# Patient Record
Sex: Female | Born: 1935 | Race: Asian | Hispanic: No | Marital: Married | Smoking: Never smoker
Health system: Southern US, Community
[De-identification: ages and names within clinical notes are randomized; demographics above are authoritative.]

## PROBLEM LIST (undated history)

## (undated) DIAGNOSIS — I6381 Other cerebral infarction due to occlusion or stenosis of small artery: Secondary | ICD-10-CM

## (undated) DIAGNOSIS — I639 Cerebral infarction, unspecified: Secondary | ICD-10-CM

## (undated) DIAGNOSIS — E119 Type 2 diabetes mellitus without complications: Secondary | ICD-10-CM

## (undated) DIAGNOSIS — H9190 Unspecified hearing loss, unspecified ear: Secondary | ICD-10-CM

## (undated) HISTORY — DX: Unspecified hearing loss, unspecified ear: H91.90

## (undated) HISTORY — DX: Cerebral infarction, unspecified: I63.9

## (undated) HISTORY — PX: OTHER SURGICAL HISTORY: SHX169

## (undated) HISTORY — DX: Other cerebral infarction due to occlusion or stenosis of small artery: I63.81

## (undated) HISTORY — DX: Type 2 diabetes mellitus without complications: E11.9

---

## 2004-11-13 ENCOUNTER — Other Ambulatory Visit: Admission: RE | Admit: 2004-11-13 | Discharge: 2004-11-13 | Payer: Self-pay | Admitting: Obstetrics and Gynecology

## 2004-11-21 ENCOUNTER — Encounter: Admission: RE | Admit: 2004-11-21 | Discharge: 2004-11-21 | Payer: Self-pay | Admitting: Gastroenterology

## 2006-02-18 ENCOUNTER — Observation Stay (HOSPITAL_COMMUNITY): Admission: AD | Admit: 2006-02-18 | Discharge: 2006-02-19 | Payer: Self-pay | Admitting: Cardiology

## 2006-07-03 ENCOUNTER — Encounter: Payer: Self-pay | Admitting: Internal Medicine

## 2007-05-18 ENCOUNTER — Observation Stay (HOSPITAL_COMMUNITY): Admission: EM | Admit: 2007-05-18 | Discharge: 2007-05-24 | Payer: Self-pay | Admitting: Emergency Medicine

## 2007-11-14 IMAGING — CR DG CHEST 1V PORT
1 series · 1 of 1 positions shown · non-contrast
Comparison: 11/21/2004

CLINICAL DATA: Unstable angina

PORTABLE CHEST - 1 VIEW:

[view not recorded]
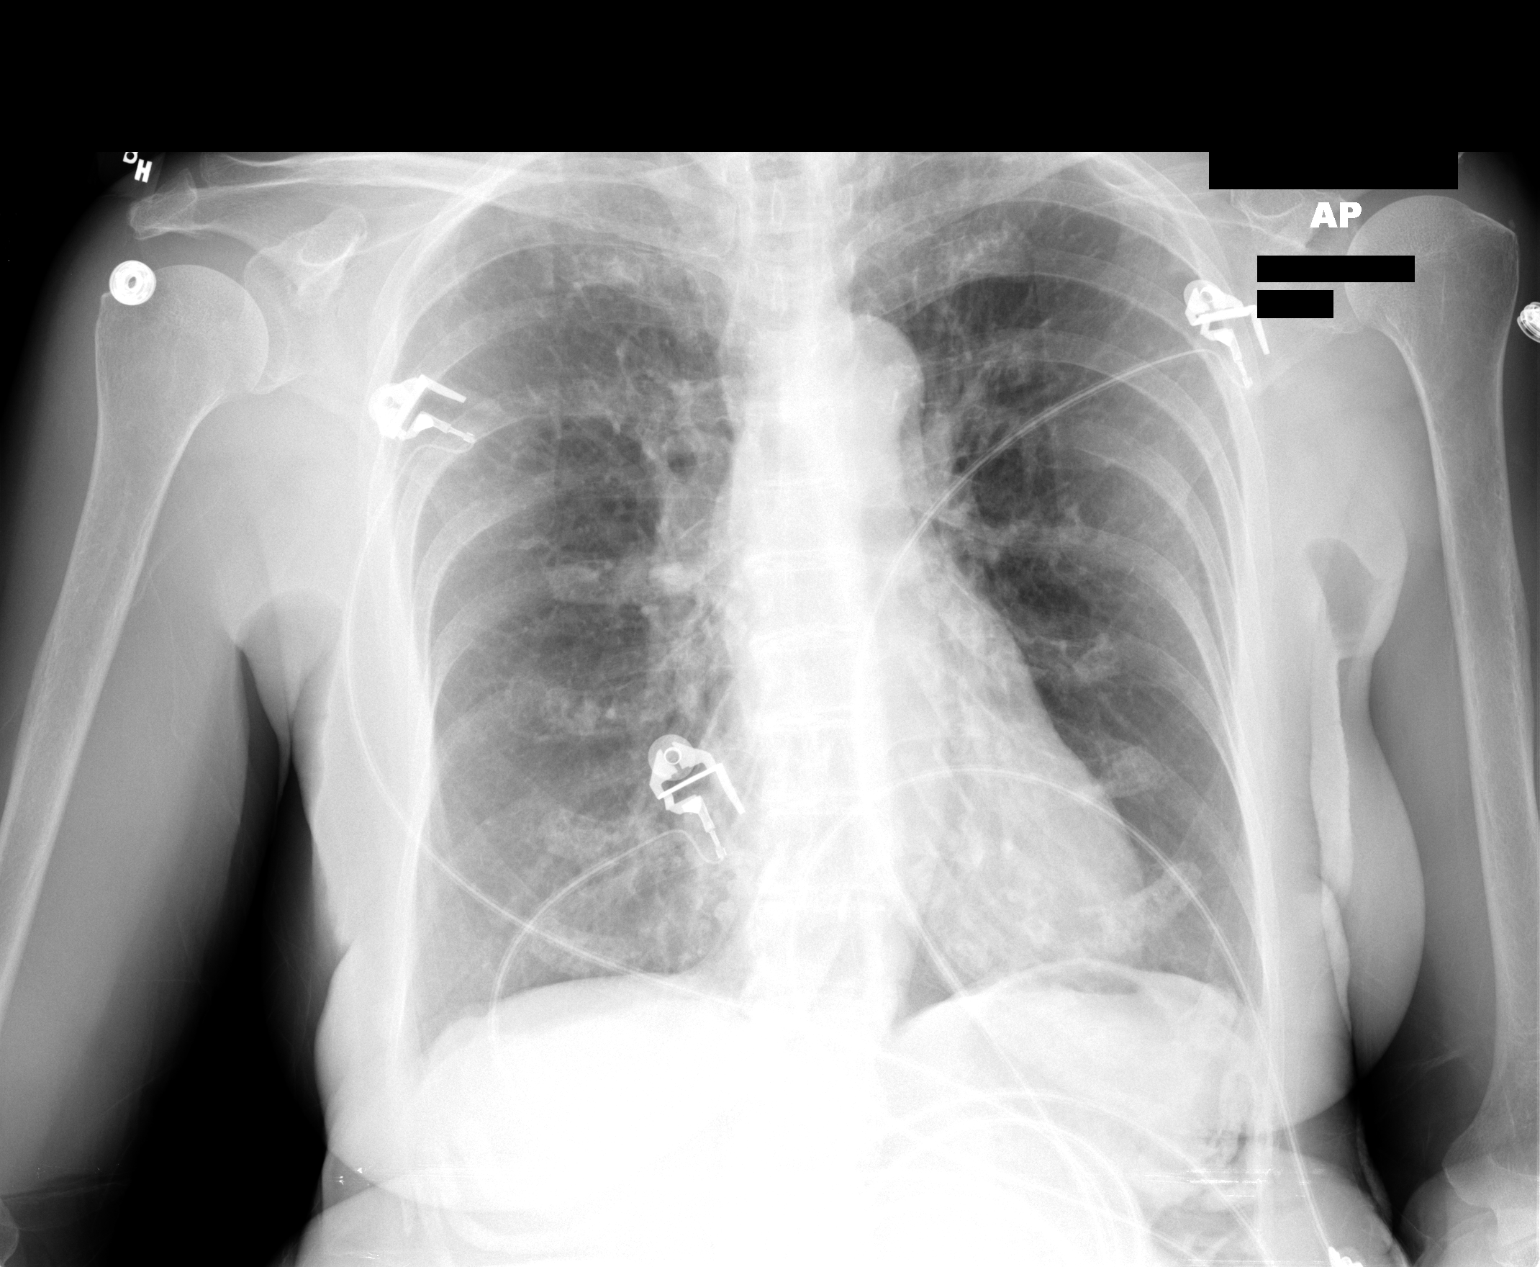

[1 of 1 positions shown; findings below may reference images not displayed]

FINDINGS: Mild COPD. Heart is borderline size. No focal airspace opacities or
effusions.
IMPRESSION: Mild COPD. No acute findings.

## 2007-12-15 ENCOUNTER — Encounter: Admission: RE | Admit: 2007-12-15 | Discharge: 2007-12-15 | Payer: Self-pay | Admitting: Neurology

## 2009-03-19 ENCOUNTER — Ambulatory Visit (HOSPITAL_COMMUNITY): Admission: RE | Admit: 2009-03-19 | Discharge: 2009-03-19 | Payer: Self-pay | Admitting: Internal Medicine

## 2009-10-24 DIAGNOSIS — E785 Hyperlipidemia, unspecified: Secondary | ICD-10-CM | POA: Insufficient documentation

## 2009-10-24 DIAGNOSIS — I2510 Atherosclerotic heart disease of native coronary artery without angina pectoris: Secondary | ICD-10-CM | POA: Insufficient documentation

## 2009-10-24 DIAGNOSIS — M949 Disorder of cartilage, unspecified: Secondary | ICD-10-CM

## 2009-10-24 DIAGNOSIS — I10 Essential (primary) hypertension: Secondary | ICD-10-CM | POA: Insufficient documentation

## 2009-10-24 DIAGNOSIS — J45909 Unspecified asthma, uncomplicated: Secondary | ICD-10-CM | POA: Insufficient documentation

## 2009-10-24 DIAGNOSIS — F329 Major depressive disorder, single episode, unspecified: Secondary | ICD-10-CM | POA: Insufficient documentation

## 2009-10-24 DIAGNOSIS — M899 Disorder of bone, unspecified: Secondary | ICD-10-CM | POA: Insufficient documentation

## 2009-10-24 DIAGNOSIS — J301 Allergic rhinitis due to pollen: Secondary | ICD-10-CM | POA: Insufficient documentation

## 2009-10-25 ENCOUNTER — Ambulatory Visit: Payer: Self-pay | Admitting: Internal Medicine

## 2009-10-25 DIAGNOSIS — R0600 Dyspnea, unspecified: Secondary | ICD-10-CM | POA: Insufficient documentation

## 2009-10-25 DIAGNOSIS — R059 Cough, unspecified: Secondary | ICD-10-CM | POA: Insufficient documentation

## 2009-10-25 DIAGNOSIS — R05 Cough: Secondary | ICD-10-CM

## 2009-11-14 ENCOUNTER — Telehealth: Payer: Self-pay | Admitting: Internal Medicine

## 2009-12-05 ENCOUNTER — Ambulatory Visit: Payer: Self-pay | Admitting: Internal Medicine

## 2009-12-07 ENCOUNTER — Ambulatory Visit: Payer: Self-pay | Admitting: Internal Medicine

## 2010-02-13 ENCOUNTER — Encounter: Payer: Self-pay | Admitting: Internal Medicine

## 2010-03-06 ENCOUNTER — Ambulatory Visit: Payer: Self-pay | Admitting: Internal Medicine

## 2010-04-10 ENCOUNTER — Ambulatory Visit: Payer: Self-pay | Admitting: Internal Medicine

## 2010-04-10 DIAGNOSIS — J029 Acute pharyngitis, unspecified: Secondary | ICD-10-CM | POA: Insufficient documentation

## 2010-04-11 ENCOUNTER — Encounter: Payer: Self-pay | Admitting: Internal Medicine

## 2010-05-08 ENCOUNTER — Telehealth: Payer: Self-pay | Admitting: Internal Medicine

## 2010-05-17 ENCOUNTER — Telehealth (INDEPENDENT_AMBULATORY_CARE_PROVIDER_SITE_OTHER): Payer: Self-pay | Admitting: *Deleted

## 2010-05-21 ENCOUNTER — Telehealth: Payer: Self-pay | Admitting: Internal Medicine

## 2010-06-19 ENCOUNTER — Encounter: Payer: Self-pay | Admitting: Internal Medicine

## 2010-09-17 ENCOUNTER — Telehealth: Payer: Self-pay | Admitting: Internal Medicine

## 2010-09-24 NOTE — Progress Notes (Signed)
Summary: check on pt sore throat-  Phone Note Outgoing Call   Call placed by: Carron Curie CMA,  May 08, 2010 4:42 PM Call placed to: Patient Summary of Call: MR requested that I call the patient and check to see if her sore throat has improved. I LMTCBx1 Initial call taken by: Carron Curie CMA,  May 08, 2010 4:43 PM  Follow-up for Phone Call        LMTCBx2. Carron Curie CMA  May 14, 2010 10:01 AM  pt states she still has a sore throat and hoarseness. She has stopped using symbicort, but this has not helped her throat. Please advise. Carron Curie CMA  May 15, 2010 4:15 PM   Additional Follow-up for Phone Call Additional follow up Details #1::        refer to ENT if still persists. [facility]. Order done Additional Follow-up by: Kalman Shan MD,  June 05, 2010 2:35 PM    Additional Follow-up for Phone Call Additional follow up Details #2::    Appt scheduled with Dr. Annalee Genta for Wed. 06/09/10 at 1:30. Pt to arrive at 1:15. Called and spoke with pt and she is aware of appt date, time and location. Appt card mailed to pt. Rhonda Cobb  June 05, 2010 3:11 PM

## 2010-09-24 NOTE — Miscellaneous (Signed)
Summary: reminder to check on patient and echo request  Clinical Lists Changes pls set reminder in 1 week to call her and find about sore throat. also, pls get her echo report from Dr. Sharyn Lull per MR.   I placed a reminder to myself to check on pt in 1 week and also called Dr. Annitta Jersey office and requested pt last echo.  Carron Curie CMA  April 11, 2010 3:31 PM   <no value>

## 2010-09-24 NOTE — Letter (Signed)
Summary: [facility]  [facility]   Imported By: Sherian Rein 02/28/2010 14:58:46  _____________________________________________________________________  External Attachment:    Type:   Image     Comment:   External Document

## 2010-09-24 NOTE — Progress Notes (Signed)
Summary: speak to nurse about rehab  Phone Note Call from Patient Call back at Home Phone [phone]   Caller: Patient Call For: Fronia Depass Summary of Call: pt has questions re: diagnostic procedure or therapy. requests to speak to jennifer. wouldn't elaborate but said jennifer will know what this is regarding.  Initial call taken by: Tivis Ringer, CNA,  May 21, 2010 11:46 AM  Follow-up for Phone Call        LMTCBx1. Carron Curie CMA  May 23, 2010 10:04 AM  LMTCBx2. Carron Curie CMA  May 24, 2010 12:09 PM  Pt husband wanted to know if the pulmonary rehab was a diagnostic procedure or therapy. I advised it is not diagnostic, but he would have to discuss with his insurance on how they label it. pt husband stated understanding. Carron Curie CMA  May 24, 2010 3:38 PM

## 2010-09-24 NOTE — Assessment & Plan Note (Signed)
Summary: 1 month/apc   Visit Type:  Follow-up Copy to:  Dr. Felipa Eth Primary Provider/Referring Provider:  Dr. Felipa Eth - PMD, Dr. Sharyn Lull - Cards  CC:  pt here for follow-up.  Pt feels like she has something stuck in her throat and and hoarseness. Marland Kitchen  History of Present Illness: OV 03/06/2010: Followup dyspnea. Last visit 10/25/2009 and 12/07/2009. Features of fixed dyspnea with hyperinflated cxr.  PFTs 12/05/2009  show hyperinflation and air trapping but otherwise normal.  I had started symbicort. However, she did not take it because of a death of a friend. She saw PMD Dr. Felipa Eth on6/22/2011 and he noticed that dyspnea still persisted. He also elicited hx that dyspnea worse at home rather than at exercise club. So, I reviewed history again. She tells me now that she did have ashma as an adult; details not known. She still is very dyspneic with exertion. Dysnea is worse at home and better at exercise club. She insists she has been compliant with symbicort for several weeks now but no change in dyspnea. She is also trying OTC antihistamine but this has not helped. Shehas ASTELIN rescue with her and she is using it a lot as needded. No other complaints. Of note, she admits to mold at home which is 75 years old. Also, we walked her 155 feet x 3 laps today and she did not desaturate.   REC: ADD SINGULAIR to SYMBICORt. DEFER HP PANEL and CT CHEST   OV April 10, 2010. Followup dyspnea. She maintains now that she is compliant with symbicort 2 puff two times a day and singulair daily. She has not cleaned out the mold at home. I am unclear if she is subjectively better or not; story changes constantly. But again it seems that she is less dyspneic outside the house than inside. The main issue bothering her this visit is One week of hoarseness of voice and sore throat of moderate intensity but without fever, cold, wheeze, worsening dyspnea or sick contacts.    Preventive Screening-Counseling &  Management  Alcohol-Tobacco     Smoking Status: never  Current Medications (verified): 1)  Azor 5-20 Mg Tabs (Amlodipine-Olmesartan) .... Take 1 Tablet By Mouthonce A Day 2)  Toprol Xl 25 Mg Xr24h-Tab (Metoprolol Succinate) .... 1/2  Once Daily 3)  Amaryl 1 Mg Tabs (Glimepiride) .... 1/2 Once Daily 4)  Crestor 5 Mg Tabs (Rosuvastatin Calcium) .Marland Kitchen.. 1 Once Daily 5)  Hydrocodone-Acetaminophen 5-500 Mg Tabs (Hydrocodone-Acetaminophen) .Marland Kitchen.. 1 Every 4-6 Hours As Needed 6)  Plavix 75 Mg Tabs (Clopidogrel Bisulfate) .Marland Kitchen.. 1 Once Daily 7)  Aspirin 81 Mg Tbec (Aspirin) .Marland Kitchen.. 1 Once Daily 8)  B-100 Complex  Tabs (Vitamins-Lipotropics) .Marland Kitchen.. 1 Once Daily 9)  Calcium Plus Vitamin D 600-100 Mg-Unit Caps (Calcium Carbonate-Vitamin D) .Marland Kitchen.. 1 Two Times A Day 10)  Fish Oil 1000 Mg Caps (Omega-3 Fatty Acids) .Marland Kitchen.. 1 Once Daily 11)  Multivitamins  Tabs (Multiple Vitamin) .Marland Kitchen.. 1 Once Daily 12)  Nu-Iron 150 Mg Caps (Polysaccharide Iron Complex) .Marland Kitchen.. 1 Once Daily 13)  Vitamin D 400 Unit Caps (Cholecalciferol) .Marland Kitchen.. 1 Once Daily 14)  Symbicort 80-4.5 Mcg/act Aero (Budesonide-Formoterol Fumarate) .... 2 Puffs Twice Daily 15)  Sm Nasal Spray Saline 0.65 % Soln (Saline) .... 2 Sprays Twice Daily 16)  Astelin 137 Mcg/spray Soln (Azelastine Hcl) .... One Spray Each Nostril Once Daily 17)  Singulair 10 Mg Tabs (Montelukast Sodium) .... At Bedtime  Allergies (verified): 1)  ! Sulfa  Past History:  Past medical, surgical, family and  social histories (including risk factors) reviewed, and no changes noted (except as noted below).  Past Medical History: Current Problems:  OSTEOPENIA (ICD-733.90) #Chronic Dyspnea/Likely asthma....Marland KitchenMarland KitchenDr Marchelle Gearing > Dxed on PFts 12/05/2009  Fev1 1.1L/84% with 12% BD response, hyperinflated cxr and lung volumes > spiro 7/132/2011 Fev1 0.76L/58%....add singulair to symbicort > Cleda Daub 04/10/2010 Fev1 1.17L/90%..continue snigulair/symbicort #ALLERGIC RHINITIS, SEASONAL (ICD-477.0) DEPRESSION  (ICD-311) HYPERTENSION (ICD-401.9) HYPERLIPIDEMIA (ICD-272.4) CAD (ICD-414.00) Positional Vertigo Constipation Depression Denies TB, asthma,  Tiny Left apical ptx - 9/23/20008 - spont resolved. ? due to fall   Family History: Reviewed history from 10/25/2009 and no changes required. Father-died at 16, bladder cancer Mother-breast cancer, died age 24 has 6 sisters, 5 brothers 1-Brother-diabetes, asthma 1-Brother-diabetes 1-Sister-diabetes, MI 1-Brother-died at 22 MI  Social History: Reviewed history from 10/25/2009 and no changes required. Married. 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 Son is an Advertising account planner in [place] and Insurance underwriter in [place].  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  Immigrant from [place]. Early [place] immigrant to [place] Husband aged 62 in year 2011.  Alive. 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 independently living at home  Review of Systems       The patient complains of productive cough and sore throat.  The patient denies shortness of breath at rest, non-productive cough, coughing up blood, chest pain, irregular heartbeats, acid heartburn, indigestion, loss of appetite, weight change, abdominal pain, difficulty swallowing, tooth/dental problems, headaches, nasal congestion/difficulty breathing through nose, sneezing, itching, ear ache, anxiety, depression, hand/feet swelling, joint stiffness or pain, rash, change in color of mucus, and fever.    Vital Signs:  Patient profile:   77 year old female Height:      60 inches Weight:      112 pounds BMI:     21.95 O2 Sat:      98 % on Room air Temp:     97.7 degrees F oral Pulse rate:   67 / minute BP sitting:   108 / 70  (right arm) Cuff size:   regular  Vitals Entered By: Carron Curie CMA (April 10, 2010 12:18 PM)  O2 Flow:  Room air CC: pt here for follow-up.  Pt feels like she has something stuck in her throat, and hoarseness.  Comments Medications reviewed with patient Carron Curie CMA  April 10, 2010 12:22 PM Daytime phone number verified with patient.    Physical Exam  General:  thin.  cheerful. looks 10-20 years younger than stated age Head:  normocephalic and atraumatic Eyes:  PERRLA/EOM intact; conjunctiva and sclera clear Ears:  TMs intact and clear with normal canals Nose:  no deformity, discharge, inflammation, or lesions Mouth:  no deformity or lesions Neck:  no masses, thyromegaly, or abnormal cervical nodes Chest Wall:  no deformities noted Lungs:  clear bilaterally to auscultation and percussion Heart:  regular rhythm, normal rate, no rubs, no gallops, and Grade  3/6 SEM loudest primary aortic area  Abdomen:  bowel sounds positive; abdomen soft and non-tender without masses, or organomegaly Msk:  no deformity or scoliosis noted with normal posture Pulses:  pulses normal Extremities:  no clubbing, cyanosis, edema, or deformity noted Neurologic:  CN II-XII grossly intact with normal reflexes, coordination, muscle strength and tone Skin:  intact without lesions or rashes Cervical Nodes:  no significant adenopathy Axillary Nodes:  no significant adenopathy Psych:  alert and cooperative; normal mood and affect; normal attention span and concentration   Pulmonary Function Test Date: 04/10/2010 12:46 AM Gender: Female  Pre-Spirometry  FVC    Value: 1.65 L/min   % Pred: 92.60 % FEV1    Value: 1.17 L     Pred: 1.30 L     % Pred: 90.30 % FEV1/FVC  Value: 71.01 %     % Pred: 97.60 %  Evaluation: normal - improved  Impression & Recommendations:  Problem # 1:  DYSPNEA (ICD-786.05) Assessment Unchanged  Spriometry has normalized after adding singulair n July 2011 to her pre-existing regimen of symbicort. I am empirically treating her as though she has adult onset asthma. STill I am unsure if she is subjectively better or not; story fluctuates and if there is an alternate diagnosis. IT does appear though she has some amount of dyspnea esp when  she is at home. I have asked her agaiin to clean out the mold in the house. I have also referred her to pulmonary rehab to help relieve dyspnea.   I also paged Dr Sharyn Lull to find out severit of aortic stenosis but did not hear from him. So, I will try to ger her ECHO report  IF these measures do not help, we might have to get CT and HP panel; she was reluctant to have these done in July 2011 visit.   Orders: Est. Patient Level II (13086)  Problem # 2:  SORE THROAT (ICD-462) Assessment: New No evidence of thrush. Unclear if related to symbicort. Will give her emopiric nystatin. IF persists, will refer her to EENT. Will call her in 1 week and check Her updated medication list for this problem includes:    Aspirin 81 Mg Tbec (Aspirin) .Marland Kitchen... 1 once daily    Nystatin 100000 Unit/ml Susp (Nystatin) .Marland Kitchen... Take one teaspoonful by mouth swish & swallow four times a day x 5 days  Orders: Prescription Created Electronically [phone]) Est. Patient Level II (96295)  Medications Added to Medication List This Visit: 1)  Singulair 10 Mg Tabs (Montelukast sodium) .... At bedtime 2)  Nystatin 100000 Unit/ml Susp (Nystatin) .... Take one teaspoonful by mouth swish & swallow four times a day x 5 days  Other Orders: Rehabilitation Referral (Rehab)  Patient Instructions: 1)  continue symbicort 2 puff two times a day 2)  cntinue singulair 10mg  by mouth at bedtime 3)  take nystatin for 5 days as directed 4)  call us in one week to report progress 5)  if sore throat still present after 1-2 weeks we might have change your inhalers around 6)  i will speak to Dr. Sharyn Lull about your heart murmur 7)  try to clear the mold out of your house 8)  i will refer you to pulmonary rehab 9)  return in 3 months Prescriptions: SINGULAIR 10 MG TABS (MONTELUKAST SODIUM) at bedtime  #30 x 3   Entered and Authorized by:   Kalman Shan MD   Signed by:   Kalman Shan MD on 04/10/2010   Method used:   Electronically  to        [facility] (retail)       [address]       [place], [place]  [address]       Ph: [phone]       Fax: [phone]   RxID:   [id number] NYSTATIN 100000 UNIT/ML  SUSP (NYSTATIN) Take one teaspoonful by mouth swish & swallow four times a day x 5 days  #1 x 0   Entered and Authorized by:   Kalman Shan MD  Signed by:   Kalman Shan MD on 04/10/2010   Method used:   [facility]ronically to        [facility] ([facility]       [address]       [place], [place]  [address]       Ph: [phone]       Fax: [phone]   RxID:   [id number]    [employer] Spirometry  ID: [id number] Patient: Ma Rings D DOB: Sep 07, 1922 Age: 77 Years Old Sex: Female Race: Other Physician: Ramaswamy Height: 60 Weight: 112 Status: Unconfirmed Past Medical History:  Current Problems:  OSTEOPENIA (ICD-733.90) ASTHMA (ICD-493.90) ALLERGIC RHINITIS, SEASONAL (ICD-477.0) DEPRESSION (ICD-311) HYPERTENSION (ICD-401.9) HYPERLIPIDEMIA (ICD-272.4) CAD (ICD-414.00) Positional Vertigo Constipation Depression Denies TB, asthma,  Tiny Left apical ptx - 9/23/20008 - spont resolved. ? due to fall   Recorded: 04/10/2010 12:46 AM  Parameter  Measured Predicted %Predicted FVC     1.65        1.78        92.60 FEV1     1.17        1.30        90.30 FEV1%   71.01        72.75        97.60 PEF    3.89        3.67        106   Interpretation:

## 2010-09-24 NOTE — Consult Note (Signed)
Summary: [facility]  [facility]   Imported By: Sherian Rein 07/10/2010 14:21:25  _____________________________________________________________________  External Attachment:    Type:   Image     Comment:   External Document

## 2010-09-24 NOTE — Miscellaneous (Signed)
Summary: Orders Update pft charges  Clinical Lists Changes  Orders: Added new Service order of Carbon Monoxide diffusing w/capacity (94720) - Signed Added new Service order of Lung Volumes (94240) - Signed Added new Service order of Spirometry (Pre & Post) (94060) - Signed 

## 2010-09-24 NOTE — Assessment & Plan Note (Signed)
Summary: rov/apc   Visit Type:  Follow-up Copy to:  Dr. Felipa Eth Primary Provider/Referring Provider:  Dr. Felipa Eth - PMD, Dr. Sharyn Lull - Cards  CC:  Pt here for follow up. Pt states breathing is the same .  History of Present Illness: OV 12/07/2009: Followup dyspnea. Last visit 11/01/2009 and 12/07/2009. Features of fixed dyspnea with hyperinflated cxr.  PFTs 12/05/2009  show hyperinflation and air trapping but otherwise normal.  I had started symbicort. However, she did not take it because of a death of a friend. She saw PMD Dr. Felipa Eth on6/22/2011 and he noticed that dyspnea still persisted. He also elicited hx that dyspnea worse at home rather than at exercise club. So, I reviewed history again. She tells me now that she did have ashma as an adult; details not known. She still is very dyspneic with exertion. Dysnea is worse at home and better at exercise club. She insists she has been compliant with symbicort for several weeks now but no change in dyspnea. She is also trying OTC antihistamine but this has not helped. Shehas ASTELIN rescue with her and she is using it a lot as needded. No other complaints. Of note, she admits to mold at home which is 75 years old. Also, we walked her 155 feet x 3 laps today and she did not desaturate.   Preventive Screening-Counseling & Management  Alcohol-Tobacco     Smoking Status: never  Current Medications (verified): 1)  Azor 5-20 Mg Tabs (Amlodipine-Olmesartan) .... Take 1 Tablet By Mouthonce A Day 2)  Toprol Xl 25 Mg Xr24h-Tab (Metoprolol Succinate) .... 1/2  Once Daily 3)  Amaryl 1 Mg Tabs (Glimepiride) .... 1/2 Once Daily 4)  Crestor 5 Mg Tabs (Rosuvastatin Calcium) .Marland Kitchen.. 1 Once Daily 5)  Hydrocodone-Acetaminophen 5-500 Mg Tabs (Hydrocodone-Acetaminophen) .Marland Kitchen.. 1 Every 4-6 Hours As Needed 6)  Plavix 75 Mg Tabs (Clopidogrel Bisulfate) .Marland Kitchen.. 1 Once Daily 7)  Aspirin 81 Mg Tbec (Aspirin) .Marland Kitchen.. 1 Once Daily 8)  B-100 Complex  Tabs (Vitamins-Lipotropics) .Marland Kitchen.. 1 Once  Daily 9)  Calcium Plus Vitamin D 600-100 Mg-Unit Caps (Calcium Carbonate-Vitamin D) .Marland Kitchen.. 1 Two Times A Day 10)  Fish Oil 1000 Mg Caps (Omega-3 Fatty Acids) .Marland Kitchen.. 1 Once Daily 11)  Multivitamins  Tabs (Multiple Vitamin) .Marland Kitchen.. 1 Once Daily 12)  Nu-Iron 150 Mg Caps (Polysaccharide Iron Complex) .Marland Kitchen.. 1 Once Daily 13)  Vitamin D 400 Unit Caps (Cholecalciferol) .Marland Kitchen.. 1 Once Daily 14)  Symbicort 80-4.5 Mcg/act Aero (Budesonide-Formoterol Fumarate) .... 2 Puffs Twice Daily 15)  Sm Nasal Spray Saline 0.65 % Soln (Saline) .... 2 Sprays Twice Daily 16)  Astelin 137 Mcg/spray Soln (Azelastine Hcl) .... One Spray Each Nostril Once Daily 17)  Non-Drowsy Allergy Relief .... As Directed  Allergies (verified): 1)  ! Sulfa  Past History:  Family History: Last updated: 01-Nov-2009 Father-died at 79, bladder cancer Mother-breast cancer, died age 70 has 6 sisters, 5 brothers 1-Brother-diabetes, asthma 1-Brother-diabetes 1-Sister-diabetes, MI 1-Brother-died at 69 MI  Social History: Last updated: 11-01-2009 Married. 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 Son is an Advertising account planner in [place] and Insurance underwriter in [place], [place].  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  Immigrant from [place]. Early [place] to [place] Husband aged 63 in year 2011.  Alive. 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 Current Problems:  OSTEOPENIA (ICD-733.90) ASTHMA (ICD-493.90) ALLERGIC RHINITIS, SEASONAL (ICD-477.0) DEPRESSION (ICD-311) HYPERTENSION (  ICD-401.9) HYPERLIPIDEMIA (ICD-272.4) CAD (ICD-414.00) Positional Vertigo Constipation Depression Denies TB, asthma,  Tiny Left apical ptx - 9/23/20008 - spont resolved. ? due to fall   Family History: Reviewed history from 10/25/2009 and no changes required. Father-died at 37, bladder cancer Mother-breast  cancer, died age 16 has 6 sisters, 5 brothers 1-Brother-diabetes, asthma 1-Brother-diabetes 1-Sister-diabetes, MI 1-Brother-died at 48 MI  Social History: Reviewed history from 10/25/2009 and no changes required. Married. 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 Son is an Advertising account planner in [place] and Insurance underwriter in [place], [place].  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  Immigrant from [place]. Early [place] immigrant [place] Husband aged 75 in year 2011.  Alive. 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 independently living at homeSmoking Status:  never  Review of Systems       The patient complains of shortness of breath with activity and non-productive cough.  The patient denies shortness of breath at rest, productive cough, coughing up blood, chest pain, irregular heartbeats, acid heartburn, indigestion, loss of appetite, weight change, abdominal pain, difficulty swallowing, sore throat, tooth/dental problems, headaches, nasal congestion/difficulty breathing through nose, sneezing, itching, ear ache, anxiety, depression, hand/feet swelling, joint stiffness or pain, rash, change in color of mucus, and fever.    Vital Signs:  Patient profile:   77 year old female Height:      60 inches Weight:      11 pounds BMI:     2.16 O2 Sat:      99 % on Room air Temp:     97.4 degrees F oral Pulse rate:   71 / minute BP sitting:   124 / 64  (left arm) Cuff size:   regular  Vitals Entered By: Zackery Barefoot CMA (March 06, 2010 3:06 PM)  O2 Flow:  Room air CC: Pt here for follow up. Pt states breathing is the same  Comments Medications reviewed with patient Verified contact number and pharmacy with patient Zackery Barefoot CMA  March 06, 2010 3:06 PM    Physical Exam  General:  thin.  cheerful. looks 10-20 years younger than stated age Head:  normocephalic and atraumatic Eyes:  PERRLA/EOM intact; conjunctiva and sclera clear Ears:  TMs intact and clear with normal canals Nose:  no  deformity, discharge, inflammation, or lesions Mouth:  no deformity or lesions Neck:  no masses, thyromegaly, or abnormal cervical nodes Chest Wall:  no deformities noted Lungs:  clear bilaterally to auscultation and percussion Heart:  regular rate and rhythm, S1, S2 without murmurs, rubs, gallops, or clicks Abdomen:  bowel sounds positive; abdomen soft and non-tender without masses, or organomegaly Msk:  no deformity or scoliosis noted with normal posture Pulses:  pulses normal Extremities:  no clubbing, cyanosis, edema, or deformity noted Neurologic:  CN II-XII grossly intact with normal reflexes, coordination, muscle strength and tone Skin:  intact without lesions or rashes Cervical Nodes:  no significant adenopathy Axillary Nodes:  no significant adenopathy Psych:  alert and cooperative; normal mood and affect; normal attention span and concentration   Pulmonary Function Test Date: 03/06/2010 3:17 PM Gender: Female  Pre-Spirometry FVC    Value: 1.41 L/min   % Pred: 78.90 % FEV1    Value: 0.76 L     Pred: 1.30 L     % Pred: 58.80 % FEV1/FVC  Value: 54.21 %     % Pred: 74.50 %  Evaluation: moderate obstruction with NO significant bronchodilator response  Impression & Recommendations:  Problem # 1:  DYSPNEA (ICD-786.05)  Assessment Unchanged  Spriometry shows moderate obstruction. Exercise desaturation - she did not desaturate. She states sh e is complinat with symbciort and thisis not helping. I am wondering if she has fixed asthma or perhaps Hypersensitivity Pneumonitis due to mold exposure at home. She is reluctant to have serum testing for HP at this point. I will add singulair to regimen of symbicort. Will see her  in 1 month with spirometry. IF spiro unchanged, will get CT chest and HP panel . Meanwhile have asked her to get rid of mold at home  Orders: Prescription Created Electronically [phone]) Est. Patient Level II (63875) HFA Instruction [phone])  Medications Added  to Medication List This Visit: 1)  Astelin 137 Mcg/spray Soln (Azelastine hcl) .... One spray each nostril once daily 2)  Non-drowsy Allergy Relief  .... As directed 3)  Singulair 10 Mg Tabs (Montelukast sodium) .... One by mouth daily at bedtime  Patient Instructions: 1)  please continue symbicort 2 puff two times a day 2)  my nurse will reinforce technique 3)  start singulair 10mg  by mouth at bedtime 4)  returnin 1 month 5)  woill do spirometry at followup 6)  try to get rid of mold at home Prescriptions: SINGULAIR 10 MG  TABS (MONTELUKAST SODIUM) One by mouth daily at bedtime  #30 x 2   Entered and Authorized by:   Kalman Shan MD   Signed by:   Kalman Shan MD on 03/06/2010   Method used:   Electronically to        [facility]       [address]       [place], [place]  [address]       Ph: [phone]       Fax: [phone]   RxID:   [id number]     Immunization History:  Influenza Immunization History:    Influenza:  historical (05/28/2009)     CardioPerfect Spirometry  ID: [id number] Patient: [name], [name] DOB: 29-Apr-1923 Age: 77 Years Old Sex: Female Race: Other Physician: Elysa Womac Height: 60 Weight: 11 Status: Unconfirmed Past Medical History:  Current Problems:  OSTEOPENIA (ICD-733.90) ASTHMA (ICD-493.90) ALLERGIC RHINITIS, SEASONAL (ICD-477.0) DEPRESSION (ICD-311) HYPERTENSION (ICD-401.9) HYPERLIPIDEMIA (ICD-272.4) CAD (ICD-414.00) Positional Vertigo Constipation Depression Denies TB, asthma,  Tiny Left apical ptx - 9/23/20008 - spont resolved. ? due to fall   Recorded: 03/06/2010 3:17 PM  Parameter  Measured Predicted %Predicted FVC     1.41        1.78        78.90 FEV1     0.76        1.30        58.80 FEV1%   54.21        72.75        74.50 PEF    1.92        3.67        52.40   Interpretation:  Appended Document: rov/apc Ambulatory Pulse  Oximetry  Resting; HR___75__    02 Sat__99%ra___  Lap1 (185 feet)   HR__86___   02 Sat__98%ra___ Lap2 (185 feet)   HR___101__   02 Sat_98%ra___    Lap3 (185 feet)   HR_103____   02 Sat__96%ra___  _X__Test Completed without Difficulty ___Test Stopped due to:   Arman Filter LPN  March 06, 2010 4:17 PM   Clinical Lists Changes

## 2010-09-24 NOTE — Assessment & Plan Note (Signed)
Summary: asthma eval/assessment for riskof high altitude flying and va...   Visit Type:  Initial Consult Copy to:  Dr. Felipa Eth Primary Provider/Referring Provider:  Dr. Felipa Eth - PMD, Dr. Sharyn Lull - Cards  CC:  Pt c/o SOB with exertion and c/o feeling like something is in her throat. Marland Kitchen  History of Present Illness: IOV 10/24/2009: 76 year old [place] immigrant. c/o dyspnea. Progressive. None at rest. Worsened with exertion. Brought on by 'regular activiy" like household activities. Relieved by rest. STarted many years ago (unclear). Insidious onset. Initially noticed only with yard work.Cardiac etiology ruled out by Dr Sharyn Lull per  patient ("  you have young woman's heart"). Denies associated chest pain, orthopnea, paroxysmal nocturnal dyspnea, fever, chills, weight loss or wheeze. Denies having PFTs. Started symbicort 1 week ago empirically and now feeling better  But does have associated cough (laryngeal) cough for a chronic uncertain period of time but reckons it is in the order of weeks or months. She feels it is more a sensation of having to clear her throat. She does not feel this is  a major issue for her.   Of note: they are planning a trip to [place] 3/14/ through 3/22  Current Medications (verified): 1)  Azor 5-20 Mg Tabs (Amlodipine-Olmesartan) .... Take 1 Tablet By Mouth Two Times A Day 2)  Toprol Xl 25 Mg Xr24h-Tab (Metoprolol Succinate) .... 1/2  Once Daily 3)  Amaryl 1 Mg Tabs (Glimepiride) .Marland Kitchen.. 1 Once Daily 4)  Crestor 5 Mg Tabs (Rosuvastatin Calcium) .Marland Kitchen.. 1 Once Daily 5)  Hydrocodone-Acetaminophen 5-500 Mg Tabs (Hydrocodone-Acetaminophen) .Marland Kitchen.. 1 Every 4-6 Hours As Needed 6)  Plavix 75 Mg Tabs (Clopidogrel Bisulfate) .Marland Kitchen.. 1 Once Daily 7)  Aspirin 81 Mg Tbec (Aspirin) .Marland Kitchen.. 1 Once Daily 8)  B-100 Complex  Tabs (Vitamins-Lipotropics) .Marland Kitchen.. 1 Once Daily 9)  Calcium Plus Vitamin D 600-100 Mg-Unit Caps (Calcium Carbonate-Vitamin D) .Marland Kitchen.. 1 Two Times A Day 10)  Fish Oil 1000 Mg Caps (Omega-3  Fatty Acids) .Marland Kitchen.. 1 Once Daily 11)  Multivitamins  Tabs (Multiple Vitamin) .Marland Kitchen.. 1 Once Daily 12)  Nu-Iron 150 Mg Caps (Polysaccharide Iron Complex) .Marland Kitchen.. 1 Once Daily 13)  Vitamin D 400 Unit Caps (Cholecalciferol) .Marland Kitchen.. 1 Once Daily 14)  Symbicort 80-4.5 Mcg/act Aero (Budesonide-Formoterol Fumarate) .... 2 Puffs Twice Daily 15)  Sm Nasal Spray Saline 0.65 % Soln (Saline) .... 2 Sprays Twice Daily  Allergies (verified): 1)  ! Sulfa  Past History:  Family History: Last updated: Nov 05, 2009 Father-died at 49, bladder cancer Mother-breast cancer, died age 66 has 6 sisters, 5 brothers 1-Brother-diabetes, asthma 1-Brother-diabetes 1-Sister-diabetes, MI 1-Brother-died at 24 MI  Social History: Last updated: Nov 05, 2009 Married. 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 Son is an Advertising account planner in [place] and Insurance underwriter in [place], [place].  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  Immigrant from [place]. Early [place] immigrant [place] Husband aged 1 in year 2011.  Alive. 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 ? due to fall   Family History: Father-died at 19, bladder cancer Mother-breast cancer, died age 59 has 6 sisters, 5 brothers 1-Brother-diabetes, asthma 1-Brother-diabetes 1-Sister-diabetes, MI 1-Brother-died at 36 MI  Social History: Married. 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 Son is an opthalomologist in  [place] [employer] in [place], [place].  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  Immigrant from [place]. Early [place] immigrant [place] Husband aged 33 in year 2011.  Alive. 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  The patient denies shortness of breath at rest, productive cough, coughing up blood, chest pain, irregular heartbeats, acid heartburn, indigestion, loss of appetite, weight change, abdominal pain, difficulty swallowing, sore throat, tooth/dental problems, headaches, nasal congestion/difficulty breathing through nose, sneezing, itching, ear ache, anxiety, depression, hand/feet swelling, joint stiffness or pain, rash, change in color of mucus, and fever.    Vital Signs:  Patient profile:   77 year old female Height:      60 inches Weight:      112 pounds O2 Sat:      99 % on Room air Temp:     98.0 degrees F oral Pulse rate:   71 / minute BP sitting:   118 / 62  (right arm) Cuff size:   regular  Vitals Entered By: Carron Curie CMA (October 25, 2009 10:32 AM)  O2 Flow:  Room air  Serial Vital Signs/Assessments:  Comments: 11:41 AM Ambulatory Pulse Oximetry  Resting; HR__60___    02 Sat__100___  Lap1 (185 feet)   HR 91_____   02 Sat_99____ Lap2 (185 feet)   HR_94____   02 Sat_99____    Lap3 (185 feet)   HR_86____   02 Sat_99____  _x__Test Completed without Difficulty ___Test Stopped due to:   Elray Buba RN  October 25, 2009 11:42 AM  By: Elray Buba RN   CC: Pt c/o SOB with exertion and c/o feeling like something is in her throat.  Comments Medications reviewed with patient Carron Curie CMA  October 25, 2009 10:33 AM Daytime phone number verified with patient.     Physical Exam  General:  thin.  cheerful. looks 10-20 years younger than stated age Head:  normocephalic and atraumatic Eyes:  PERRLA/EOM intact; conjunctiva and sclera clear Ears:  TMs intact and clear with normal canals Nose:  no deformity, discharge, inflammation, or lesions Mouth:  no deformity or lesions Neck:  no masses, thyromegaly, or abnormal cervical nodes Chest Wall:  no  deformities noted Lungs:  clear bilaterally to auscultation and percussion Heart:  regular rate and rhythm, S1, S2 without murmurs, rubs, gallops, or clicks Abdomen:  bowel sounds positive; abdomen soft and non-tender without masses, or organomegaly Msk:  no deformity or scoliosis noted with normal posture Pulses:  pulses normal Extremities:  no clubbing, cyanosis, edema, or deformity noted Neurologic:  CN II-XII grossly intact with normal reflexes, coordination, muscle strength and tone Skin:  intact without lesions or rashes Cervical Nodes:  no significant adenopathy Axillary Nodes:  no significant adenopathy Psych:  alert and cooperative; normal mood and affect; normal attention span and concentration   CXR  Procedure date:  10/25/2009  Findings:      hyperinflated chest. no infiltrates  Comments:      independently reviewd  Impression & Recommendations:  Problem # 1:  DYSPNEA (ICD-786.05) Assessment New I am unclear why she is dyspneic. The fact she has hyperinflated chest on cxr and the fact she feels better after symbicort suggests she might have asthma. She does not desaturate with exertion suggesting there is no severe ILD or pulmonary hypertension or copd. I will get full PFTs and re-evaluate Orders: Pulmonary Referral (Pulmonary) T-2 View  CXR (71020TC) Consultation Level IV (16109)  Problem # 2:  COUGH (ICD-786.2) Assessment: New  She describes a clearing of throat. She is not bothered by it. But, I wonder if this is due to associated GERD. Fish oil could be contributing to this. I will adddress this at next visit  Orders: Consultation Level IV (60454)  Medications Added to Medication List This Visit: 1)  Azor 5-20 Mg Tabs (Amlodipine-olmesartan) .... Take 1 tablet by mouth two times a day 2)  Toprol Xl 25 Mg Xr24h-tab (Metoprolol succinate) .... 1/2  once daily 3)  Symbicort 80-4.5 Mcg/act Aero (Budesonide-formoterol fumarate) .... 2 puffs twice daily 4)  Sm  Nasal Spray Saline 0.65 % Soln (Saline) .... 2 sprays twice daily  Patient Instructions: 1)   you did not drop oxygen levels. Good news 2)  have full breathing test - to help [place] understand breathing issues 3)  as soon as test is done please have the tech call me to look at it  4)  Based on breathing test result, will advise  you of next step 5)  We need actual CXR hard film from June 2010 done at Dr Felipa Eth office - we can try to get it or you might have to go get it 6)  ................ 7)  UPDATE: Will get a fu appt visit for PFT review

## 2010-09-24 NOTE — Progress Notes (Signed)
Summary: clarification-LMTCBx1  Phone Note Call from Patient   Caller: Patient Call For: ramaswamy Summary of Call: need to no what kind of proceedure she is having . insurance is requesting this info..taken by Rickard Patience  Follow-up for Phone Call        [facility] to disucss with pt.Marland KitchenMarland KitchenI dont see that pt is schedueld for any procedure. Carron Curie CMA  May 17, 2010 12:16 PM  Pt husband called back and was asking about the cost of pulmonary rehab and told me we needed to call insurance company at [phone] because they needed more info before coverage. . I called and they stated rehab was covered at $40 copay. I then called rehab and spoke with Boneta Lucks and she states they sent the pt ins info to benefits speacialists Gardner Candle at [phone] for her to verify ins info. I LMTCB to advised pt they needed to call this number with questions. Carron Curie CMA  May 17, 2010 4:29 PM    Additional Follow-up for Phone Call Additional follow up Details #1::        pt's husband called back.  gave him Melford Aase number to call to address his benefits/ins concerns.  husband verbalized understanding.  Aundra Millet Reynolds LPN  May 21, 2010 11:38 AM

## 2010-09-24 NOTE — Assessment & Plan Note (Signed)
Summary: rov/ pft results ///kp   Visit Type:  Follow-up Copy to:  Dr. Felipa Eth Primary Provider/Referring Provider:  Dr. Felipa Eth - PMD, Dr. Sharyn Lull - Cards  CC:  Pt here to discuss PFT results. pt states she ahs not been using symbicort.[name]  History of Present Illness: OV 12/07/2009: Followup dyspnea. Last visit November 20, 2009. Features of fixed dyspnea with hyperinflated cxr. At that time she was already on a week's symbicort given to her by PMD and she was feeling better. We had ordered PFTs. However, shortly after visit she went to [place] on vacation and since then has forgottent to take symbicort. Therefore, dyspnea has persisted unchanged. She now presents for followp after PFTs 2 days ago. PFTs show hyperinflation and air trapping but otherwise normal.   Current Medications (verified): 1)  Azor 5-20 Mg Tabs (Amlodipine-Olmesartan) .... Take 1 Tablet By Mouthonce A Day 2)  Toprol Xl 25 Mg Xr24h-Tab (Metoprolol Succinate) .... 1/2  Once Daily 3)  Amaryl 1 Mg Tabs (Glimepiride) .... 1/2 Once Daily 4)  Crestor 5 Mg Tabs (Rosuvastatin Calcium) .[name].. 1 Once Daily 5)  Hydrocodone-Acetaminophen 5-500 Mg Tabs (Hydrocodone-Acetaminophen) .[name].. 1 Every 4-6 Hours As Needed 6)  Plavix 75 Mg Tabs (Clopidogrel Bisulfate) .[name].. 1 Once Daily 7)  Aspirin 81 Mg Tbec (Aspirin) .[name].. 1 Once Daily 8)  B-100 Complex  Tabs (Vitamins-Lipotropics) .[name].. 1 Once Daily 9)  Calcium Plus Vitamin D 600-100 Mg-Unit Caps (Calcium Carbonate-Vitamin D) .[name].. 1 Two Times A Day 10)  Fish Oil 1000 Mg Caps (Omega-3 Fatty Acids) .[name].. 1 Once Daily 11)  Multivitamins  Tabs (Multiple Vitamin) .[name].. 1 Once Daily 12)  Nu-Iron 150 Mg Caps (Polysaccharide Iron Complex) .[name].. 1 Once Daily 13)  Vitamin D 400 Unit Caps (Cholecalciferol) .[name].. 1 Once Daily 14)  Symbicort 80-4.5 Mcg/act Aero (Budesonide-Formoterol Fumarate) .... 2 Puffs Twice Daily 15)  Sm Nasal Spray Saline 0.65 % Soln (Saline) .... 2 Sprays Twice Daily  Allergies (verified): 1)  !  Sulfa  Past History:  Family History: Last updated: November 20, 2009 Father-died at 51, bladder cancer Mother-breast cancer, died age 71 has 6 sisters, 5 brothers 1-Brother-diabetes, asthma 1-Brother-diabetes 1-Sister-diabetes, MI 1-Brother-died at 26 MI  Social History: Last updated: 11-20-2009 Married. 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 Son is an Advertising account planner in [place] and Insurance underwriter in [place], [place].  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  Immigrant from [place]. Early [place] immigrant to [place] Husband aged 27 in year 2011.  Alive. 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 independently living at home  Past Medical History: Reviewed history from Nov 20, 2009 and no changes required. Current Problems:  OSTEOPENIA (ICD-733.90) ASTHMA (ICD-493.90) ALLERGIC RHINITIS, SEASONAL (ICD-477.0) DEPRESSION (ICD-311) HYPERTENSION (ICD-401.9) HYPERLIPIDEMIA (ICD-272.4) CAD (ICD-414.00) Positional Vertigo Constipation Depression Denies TB, asthma,  Tiny Left apical ptx - 9/23/20008 - spont resolved. ? due to fall   Family History: Reviewed history from Nov 20, 2009 and no changes required. Father-died at 64, bladder cancer Mother-breast cancer, died age 30 has 6 sisters, 5 brothers 1-Brother-diabetes, asthma 1-Brother-diabetes 1-Sister-diabetes, MI 1-Brother-died at 30 MI  Social History: Reviewed history from 2009-11-20 and no changes required. Married. 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 Son is an Advertising account planner in [place] and Insurance underwriter in [place], [place].  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  Immigrant from [place]. Early [place] immigrant to [place] Husband aged 27 in year 2011.  Alive. 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  The patient denies shortness of breath at rest, productive cough, non-productive  cough, coughing up blood, chest pain, irregular heartbeats, acid heartburn, indigestion, loss of appetite, weight change, abdominal pain, difficulty swallowing, sore throat, tooth/dental problems, headaches, sneezing, itching, ear ache, anxiety, depression, hand/feet swelling, joint stiffness or pain, rash, change in color of mucus, and fever.    Vital Signs:  Patient profile:   77 year old female Height:      60 inches Weight:      110.13 pounds O2 Sat:      99 % on Room air Temp:     98.4 degrees F oral Pulse rate:   66 / minute BP sitting:   100 / 62  (right arm) Cuff size:   regular  Vitals Entered By: Carron Curie CMA (December 07, 2009 11:53 AM)  O2 Flow:  Room air  Physical Exam  General:  thin.  cheerful. looks 10-20 years younger than stated age Head:  normocephalic and atraumatic Eyes:  PERRLA/EOM intact; conjunctiva and sclera clear Ears:  TMs intact and clear with normal canals Nose:  no deformity, discharge, inflammation, or lesions Mouth:  no deformity or lesions Neck:  no masses, thyromegaly, or abnormal cervical nodes Chest Wall:  no deformities noted Lungs:  clear bilaterally to auscultation and percussion Heart:  regular rate and rhythm, S1, S2 without murmurs, rubs, gallops, or clicks Abdomen:  bowel sounds positive; abdomen soft and non-tender without masses, or organomegaly Msk:  no deformity or scoliosis noted with normal posture Pulses:  pulses normal Extremities:  no clubbing, cyanosis, edema, or deformity noted Neurologic:  CN II-XII grossly intact with normal reflexes, coordination, muscle strength and tone Skin:  intact without lesions or rashes Cervical Nodes:  no significant adenopathy Axillary Nodes:  no significant adenopathy Psych:  alert and cooperative; normal mood and affect; normal attention span and concentration   Pulmonary Function Test Date: 12/07/2009 Gender: Female  Pre-Spirometry FVC    Value: 1.76 L/min   % Pred: 87 % FEV1     Value: 1.1 L     % Pred: 84 % FEV1/FVC  Value: 63 %     Pred: 69 %    FEF 25-75  Value: 0.53 L/min   % Pred: 33 %  Post-Spirometry FEV1    Value: 1.24 L     % Pred: 94 %  Comments: TLC 4.6/118%, RV 2.8/164% DLCO  12.8/98%  Evaluation: near normal - significant bronchodilator response  Recommendations: borderline positive BD response AIr traping Hyperinf;lation Normal diffusion  Possible asthma.    Impression & Recommendations:  Problem # 1:  ASTHMA (ICD-493.90) Assessment Unchanged It is likely she has adult onset asthma. PFTs show air trapping and hyperinflation. Have asked her to be compliant with symbicort. Will check her subjective response and repeat spirometry in 6 weeks if she can be compliant with symbicort  Problem # 2:  COUGH (ICD-786.2) Assessment: Unchanged  She describes a clearing of throat. She states now she is bothereed but it. I wonder if this is due to associated GERD. Fish oil could be contributing to this.However, she is clearly c/o post nasal drip. I have advised netti pott to start wtih it. Will review during followup  Medications Added to Medication List This Visit: 1)  Azor 5-20 Mg Tabs (Amlodipine-olmesartan) .... Take 1 tablet by mouthonce a day 2)  Amaryl 1 Mg Tabs (Glimepiride) .... 1/2 once daily  Other Orders: Prescription Created  Electronically [phone]) Est. Patient Level II (60454) HFA Instruction [phone])  Patient Instructions: 1)  you might have asthma to explain your symptoms 2)  please take symbicort 80/4.5 2 puff two times a day  3)  take the symbicort iwthout fail 4)  learn and demonstrate technique to my nurse 5)  also take netti pot sample from our office 6)  if we dont have sample, buy it at [facility] or [facility] or [facility] 7)  use netti pot atleast few times a week or daily 8)  return in 6 weeks  9)  we will do office breathing test spirometry at followup 10)  call us or come sooner if there are problems Prescriptions: SYMBICORT  80-4.5 MCG/ACT AERO (BUDESONIDE-FORMOTEROL FUMARATE) 2 puffs twice daily  #1 x 6   Entered and Authorized by:   Kalman Shan MD   Signed by:   Kalman Shan MD on 12/07/2009   Method used:   Electronically to        [name] (retail)       [address]       [place], [place]  [address]       Ph: [phone]       Fax: [phone]   RxID:   [id number]

## 2010-09-24 NOTE — Progress Notes (Signed)
Summary: schedule f/u  Phone Note Outgoing Call   Call placed by: Carron Curie CMA,  November 14, 2009 12:36 PM Call placed to: Patient Summary of Call:   pls give appt for them to see me because they are now on [place] trip through 3/22. MR  Follow-up for Phone Call        [facility] to schedule an appt. Carron Curie CMA  November 14, 2009 12:37 PM  LMTCBx2. Carron Curie CMA  November 20, 2009 3:27 PM  pt scheduled fro PFT 4-13, ROV on 4-15. Carron Curie CMA  November 23, 2009 12:07 PM

## 2010-09-26 NOTE — Progress Notes (Signed)
Summary: need to talk to dR. Harwani  Phone Note [employer] of Call: finally got echo report from Dr Sharyn Lull office 0 an echo was done in 2007 and shows mild Mitral regurg. Wonder if she needs repeat echo. Please get hold of Dr. Sharyn Lull when I am in office next Initial call taken by: Kalman Shan MD,  September 17, 2010 3:14 AM  Follow-up for Phone Call        called Dr. Katrinka Blazing office and left MR cell number for Dr. Sharyn Lull to call back. Carron Curie CMA  September 20, 2010 3:14 PM   Additional Follow-up for Phone Call Additional follow up Details #1::        spoke to [facility]. He has agreed to repeat echo to see if MR worse since 2007 to explain her dyspnea. He will get back to me. I will set reminder in 1 month Additional Follow-up by: Kalman Shan MD,  September 20, 2010 3:34 PM

## 2010-10-23 ENCOUNTER — Encounter: Payer: Self-pay | Admitting: Internal Medicine

## 2010-10-31 NOTE — Miscellaneous (Signed)
Summary: echo report requested  ---- Converted from flag ---- ---- 09/20/2010 3:34 PM, Kalman Shan MD wrote: call harwani office and get echo report if we have not gotten it so far  echo report requested to be faxed to main fax to my attention. I will place in MR box to review.  ------------------------------

## 2010-11-20 ENCOUNTER — Encounter: Payer: Self-pay | Admitting: Internal Medicine

## 2011-01-07 NOTE — H&P (Signed)
[name], [name]              ACCOUNT NO.:  [id number]   MEDICAL RECORD NO.:  [id number]          PATIENT TYPE:  OBV   LOCATION:  5733                         FACILITY:  [facility]   PHYSICIAN:  Kari Baars, M.D.  DATE OF BIRTH:  05/16/1923   DATE OF ADMISSION:  05/18/2007  DATE OF DISCHARGE:                              HISTORY & PHYSICAL   PRIMARY CARE PHYSICIAN:  [name], M.D.   CHIEF COMPLAINT:  Left rib pain.   HISTORY OF PRESENT ILLNESS:  [name] is an 77 year old [place] female  with a history of type 2 diabetes, hypertension, and nonobstructive  coronary artery disease who presented to the emergency department today  with a complaint of left rib and back pain following a fall.  The  patient was in her usual state of health this morning.  She ate  breakfast and took her Amaryl and subsequently went to the club/gym to  workout.  She completed water aerobics and after this developed some  dizziness.  She laid down on the bench and subsequently fell onto the  floor.  She does not recall loss of consciousness but did suffer  significant left rib and back pain.  She presented to the emergency  department where a rib series does reveal rib fractures and tiny left  pneumothorax.  Currently, she complains of left-sided chest pain which  was severe upon presentation but is now improved.  It is worse with  cough and deep breath.  She has had minimal shortness of breath.  She  does state that she has had occasional hypoglycemia and takes her Amaril  sporadically, depending on her activity level and p.o. intake.  She has  had infrequent dizziness.  She has a very active lifestyle and denies  any weight loss.   PAST MEDICAL HISTORY:  1. Type 2 diabetes with an A1c of 5.9 in March 2008.  2. Hypertension.  3. Nonobstructive coronary artery disease, with cardiac      catheterization in June 2007.  4. Hyperlipidemia.  5. Anemia.   MEDICATIONS:  1. Amaryl 1 mg daily.  2.  Toprol XL 12.5 mg daily.  3. Aspirin daily.  4. Azor 5/40 daily.  5. Multivitamin.  6. Calcium/Vitamin D.   ALLERGIES:  1. SULFA.  2. FOSAMAX.   SOCIAL HISTORY:  She is married with 3 sons, two of whom are physicians.  No tobacco, alcohol or drug use.   PHYSICAL EXAMINATION:  VITAL SIGNS:  Temperature 98.0, blood pressure  126/78, pulse 70, respirations 22, oxygen saturation 97% on room air.  GENERAL:  She is a pleasant, well-spoken female in no acute distress.  HEENT:  Oropharynx is moist.  NECK:  Supple without lymphadenopathy, JVD or carotid bruits.  HEART:  Regular rate and rhythm without murmurs, rubs or gallops.  LUNGS:  Clear to auscultation bilaterally. She does have some left chest  wall tenderness.  ABDOMEN:  Soft, nondistended, nontender with normoactive bowel sounds.  EXTREMITIES:  No clubbing, cyanosis or edema.  NEUROLOGIC:  Nonfocal exam.   LABS:  CBC shows a white count of 9.5, hemoglobin 12.1, platelets  286,000.  B-MET significant for a sodium of 137, potassium 4.3, chloride  of 102, bicarbonate 27, BUN 20, creatinine 0.7, glucose 147.   STUDIES:  1. Left rib series shows a tiny left apical pneumothorax with probable      left rib fractures.  2. EKG:  Sinus rhythm with first degree AV block with poor R-wave      progression.   ASSESSMENT/PLAN:  1. Pleuritic chest pain secondary to rib fracture and left      pneumothorax due to a fall - will admit for observation status to      monitor the pneumothorax and manage her severe pain.  Plan for a      repeat chest x-ray in the morning.  We will treat her pain with      naproxen, Tylenol/Vicodin/morphine as needed.  Monitor for GI      affect from the NSAIDs.  Unfortunately, her sulfa allergies does      preclude the use of Celebrex.  If her pain worsens or fails to      improve, we will consider thoracic x-rays to rule out a vertebral      compression fracture.  2. Dizziness - possibly due to hypoglycemia due  to her glimepiride.      She has had a recent cardiac evaluation which is negative and no      focal neurologic symptoms.  Will monitor her symptoms.  3. Type 2 diabetes - will hold her Amaryl due to possible      hypoglycemia.  I consider change to metformin.  Will cover with      sliding scale insulin.   DISPOSITION:  Anticipate discharge in the morning if her pneumothorax  has resolved with control of her pain.      Kari Baars, M.D.  Electronically Signed     WS/MEDQ  D:  05/18/2007  T:  05/19/2007  Job:  [id number]   cc:   [name], M.D.

## 2011-01-07 NOTE — Discharge Summary (Signed)
[name], [name]              ACCOUNT NO.:  [id number]   MEDICAL RECORD NO.:  [id number]          PATIENT TYPE:  INP   LOCATION:  5733                         FACILITY:  [facility]   PHYSICIAN:  Larina Earthly, M.D.        DATE OF BIRTH:  27-Apr-1923   DATE OF ADMISSION:  05/18/2007  DATE OF DISCHARGE:  05/24/2007                               DISCHARGE SUMMARY   DISCHARGE DIAGNOSES:  1. Small (less than 5%) left apical pneumothorax status post fall with      probable occult left rib fracture secondary to dizziness, now with      near resolution based on chest x-ray from May 19, 2007.  2. Probable occult rib fracture secondary to fall in the setting of      osteopenia with adequate pain relief but continued instability and      need for physical and occupational therapy to increase stability      tolerance and endurance for activities of daily living and safety.  3. Dizziness with outpatient neurological evaluation by Dr. Pearlean Brownie      unremarkable with normal electroencephalogram and normal nerve      conduction studies and cardiac catheterization by Dr. Sharyn Lull in      June of 2007 revealing nonobstructive coronary artery disease with      probable etiologies for her dizziness to include vestibular      dysfunction.  4. Type 2 diabetes mellitus, stable on low-dose oral sulfanuria with      no significant lows and adequate caloric intake.  5. Hypertension, stable on multiple medications.  6. Pain management, stable with anti-inflammatory medications with      normal renal parameters and as needed use of Vicodin.  7. Constipation, currently on MiraLax with possible need for      additional agents given her immobility.   DISCHARGE MEDICATIONS:  1. Amaryl 1 mg p.o. q.a.m.  2. Toprol-XL 12.5 mg p.o. daily.  3. Aspirin 81 mg p.o. daily.  4. Azor 5/40 1 p.o. daily.  5. Multivitamin daily.  6. Calcium with vitamin D supplementation b.i.d.  7. Vicodin 1 p.o. q.6h. p.r.n.  8. Mobic 7.5  mg p.o. daily, to be discontinued at the discretion of      skilled nursing facility physician.   ALLERGIES:  SULFA AND FOSAMAX.   DISCHARGE LABS:  White blood cell count 5.5, hemoglobin 11.5, hematocrit  34.6%, platelet count 265.  Sodium 137, potassium 4.0, BUN 13,  creatinine 0.64, glucose 99, calcium 9.0.   T-spine showed osteopenia, degenerative disk disease but no acute  fracture.  Chest x-ray on May 18, 2007 showed a less than 5%  apical pneumothorax with probable occult left rib fracture.  Chest x-ray  from May 19, 2007 showed tiny apical pneumothorax with near  complete resolution and no additional acute disease.   HISTORY OF PRESENT ILLNESS:  This is an 77 year old [place] female who  has a past medical history significant for type 2 diabetes,  hypertension, nonobstructive coronary artery disease who presented to  the emergency room after a fall, complaining of left rib and back  pain.  At her baseline, she is somewhat frail but does do water aerobics on a  regular basis along with her husband.  On the day of admission, she laid  down on a bench and subsequently fell to the floor secondary to  dizziness.  This has been somewhat of a chronic issue and has been  evaluated by cardiology and neurology, as mentioned above.  She did not  have any loss of consciousness; however, given significant left-sided  chest pain which was severe with movement as well as with deep  inspiration, she was admitted for further evaluation and management.   HOSPITAL COURSE:  The patient over a period of several days slowly  improved with her ability to ambulate, albeit with assistance.  Physical  and occupational therapy did see the patient and noted her frailty as  well as her need for increased activity tolerance and the need for  increasing her endurance for ADLs and safety, given the fact that her  husband is somewhat frail with degenerative joint disease and cannot  assist her with  ADLs or ADL transfers.  This was fully discussed with  the patient on September 25 and May 21, 2007, and it was thought  that this patient would benefit significantly from short-term  rehabilitation prior to transition home with her husband and a  supportive community.  She is scheduled for transfer on May 24, 2007.      Larina Earthly, M.D.  Electronically Signed     RA/MEDQ  D:  05/24/2007  T:  05/24/2007  Job:  [id number]

## 2011-01-10 NOTE — Discharge Summary (Signed)
[name], [name]              ACCOUNT NO.:  [id number]   MEDICAL RECORD NO.:  [id number]          PATIENT TYPE:  INP   LOCATION:  2010                         FACILITY:  [facility]   PHYSICIAN:  Eduardo Osier. Sharyn Lull, M.D. DATE OF BIRTH:  1922/12/15   DATE OF ADMISSION:  02/18/2006  DATE OF DISCHARGE:  08/21/2005                                 DISCHARGE SUMMARY   ADMISSION DIAGNOSIS:  1.  New onset angina, rule out myocardial infarction.  2.  Hypertension.  3.  Non-insulin-dependent diabetes mellitus.  4.  Abnormal EKG, probable old anteroseptal wall myocardial infarction.  5.  Positive family history of coronary artery disease.  6.  Diabetic neuropathy.   FINAL DIAGNOSIS:  1.  Mild coronary artery disease status post left cardiac catheterization.  2.  Hypertensive heart disease.  3.  Hypertension.  4.  Non-insulin dependent diabetes mellitus.  5.  Hypercholesteremia, controlled by diet.  6.  Chronic anemia, stable.  7.  Gastroesophageal reflux disease.  8.  Family history of coronary artery disease.  9.  Diabetic neuropathy.  10. Osteoporosis.   DISCHARGE MEDICATIONS:  Enteric-coated aspirin 81 mg 1 tablet daily, Toprol  XL 25 mg 1/2 tablet daily, Norvasc 5 mg 1 tablet daily, Altace 10 mg 1  capsule daily, Nexium 40 mg 1 capsule daily half hour before breakfast,  Amaryl 1 tablet daily as before, Feosol 1 tablet daily, Os-Cal calcium  tablet 1 tablet daily as before.   DISCHARGE INSTRUCTIONS:  Post cardiac cath instructions have been given.  The patient has been advised to drink plenty of fluids, low salt, low  cholesterol, 1800 calories ADA diet.  The patient has been advised to  monitor blood sugar regularly. Follow-up with me in one week.   CONDITION AT DISCHARGE:  Stable.   BRIEF HISTORY AND HOSPITAL COURSE:  [name] is an 77 year old Asian  female with past medical history significant for hypertension, non-insulin-  dependent diabetes mellitus, positive family  history of coronary artery  disease, chronic anemia, GERD, who complains of retrosternal chest pressure  and tightness with minimal exertion, grade 8/10, localized, associated with  feeling weak, tired, dizzy and shortness of breath for the last two weeks  off and on.  She states that chest pressure with walking or working in the  yard relieved with rest.  Denies any palpitation, light headedness or  syncope.  Denies PND, orthopnea or leg swelling.  Denies any recent cardiac  workup.  The patient had EKG done approximately two weeks ago which showed  normal sinus rhythm with Q-wave in the lead V3 and poor R-wave progression  in V1 to V3. The patient presently denies any chest pain.  The patient  denies any weakness in the arms or legs, denies any claudication pain, but  occasionally complains of burning sensation in the feet.   PAST MEDICAL HISTORY:  As above.   PAST SURGICAL HISTORY:  She had left cataract surgery in the past.   ALLERGIES:  She is allergic to SULFA AND THEOPHYLLINE.   SOCIAL HISTORY:  She is married, retired professor, no history of smoking or  alcohol abuse.   FAMILY HISTORY:  Father died of old age.  Mother died of cancer at age of  86.  One sister had MI in her 14s.  Another sister had an MI in her 41s.   MEDICATION AT HOME:  She is on Norvasc 5 mg p.o. daily, Benicar 40 mg p.o.  daily, Amaryl 1 mg daily, calcium 1 tablet daily, enteric coated aspirin 81  mg 2 tablets daily which was started yesterday, Nexium 40 mg p.o. daily,  Nitrostat 0.4 mg sublingual p.r.n.   PHYSICAL EXAMINATION:  On examination she is alert, awake, and oriented x3  in no acute distress.  Blood pressure was 140/70, pulse was 68 and regular.  Conjunctivae was pink.  Neck supple, no JVD, no bruit.  Lungs were clear to  auscultation without rhonchi or rales.  Cardiovascular exam reveals S1 and  S2 was normal.  There was a soft systolic murmur at the left lower sternal  border.  Abdomen was  soft.  Bowel sounds were present, nontender.  Extremities shows no clubbing, cyanosis or edema.  Pedal pulses were 2+.   LABORATORY DATA:  EKG showed normal sinus rhythm, possible left atrial  enlargement, inferior infarct age undetermined, cannot rule out anterior  infarct undetermined, there were minor T-wave inversions in septal and high  lateral leads.  Two sets of cardiac enzymes were negative.  Hemoglobin was  11.5, hematocrit 34.7, white count of 5.5, glucose was 152, potassium 3.8,  BUN was 20, creatinine 0.8. CK was 61, MB 1.8. Second set CK 71, MB 1.8.  Troponin I was 0.04 and 0.02. Her cholesterol was 167, HDL 65, LDL was  slightly elevated at 89.   BRIEF HISTORY AND HOSPITAL COURSE:  The patient was admitted to telemetry  unit.  MI was ruled out by serial enzymes and EKG.  The patient was started  on IV heparin and nitrates and low dose beta blockers.  The patient did not  have any further episodes of chest pain during the hospital stay. The  patient underwent left cardiac cath with selective left and right coronary  angiography and left ventriculography as per procedure report today without  any complications.  The patient was noted to have mild coronary artery  disease with hyperdynamic LV systolic function.  Post procedure, her groin  is stable with no evidence of hematoma or bruit.  The patient has been  ambulating in the room without any problems.  The patient is off IV nitrates  and heparin for the last few hours. The  patient will be discharged home on the above medications and will be  followed up in my office in one week.  If the patient continues to have  recurrent dyspnea, we will get a 2D echo as outpatient to evaluate for  diastolic dysfunction.           ______________________________  Eduardo Osier Sharyn Lull, M.D.     MNH/MEDQ  D:  02/19/2006  T:  02/19/2006  Job:  [id number]   cc:   Larina Earthly, M.D. Fax: [phone]   OZHYQM VHQ IONG, M.D.  Fax: [phone]

## 2011-01-10 NOTE — Cardiovascular Report (Signed)
NAMEAIRIKA, [name]              ACCOUNT NO.:  [id number]   MEDICAL RECORD NO.:  [id number]          PATIENT TYPE:  INP   LOCATION:  2010                         FACILITY:  [facility]   PHYSICIAN:  Eduardo Osier. Sharyn Lull, M.D. DATE OF BIRTH:  05-Sep-1922   DATE OF PROCEDURE:  02/19/2006  DATE OF DISCHARGE:                              CARDIAC CATHETERIZATION   PROCEDURE:  Left cardiac catheterization with selective left and right  coronary angiography, left ventriculography via right groin using Judkins  technique.   INDICATIONS FOR PROCEDURE:  [name] is an 77 year old Asian female with  past medical history significant for hypertension, non-insulin-dependent  diabetes mellitus, positive family history of coronary artery disease, who  complains of retrosternal chest pressure and tightness with minimal  exertion, grade 8/10, localized, associated with feeling weak and tired and  short of breath for the last two weeks off and on.  She states she gets  chest pressure with especially with walking or working in the yard, relieved  with rest.  Denies any palpitation, light headedness, syncope, but  occasionally feels dizzy. Denies any PND, orthopnea or leg swelling.  Denies  any recent cardiac workup.  The patient had EKG done approximately two weeks  ago which showed normal sinus rhythm with Q waves in the lead 3 and poor R  wave progression in V1 to V3 suggestive of probable old anteroseptal wall  MI.  The patient presently denies any chest pain.  Denies any weakness in  the arms or legs.  Denies any claudication pain but complains of occasional  burning sensation in the feet. The patient went home and started having  retrosternal chest pressure associated with shortness of breath and feeling  weak and was admitted at [facility] EKG done showed similar  changes with Q waves in lead 3 and small nondiagnostic small Q wave in AVF  and poor R wave progression in V1 to V4.  The patient  had some minor T-wave  changes in the septal and high lateral leads.  The patient was admitted to  telemetry unit, MI ruled out by serial enzymes and EKG.  I discussed with  the patient and her friend and son regarding left cath, possible PTCA and  stenting, its risks and benefits, i.e., death, MI, stroke, need for  emergency CABG, risk of restenosis, local vascular complications, etc., and  consented for the procedure.   PROCEDURE:  After obtaining informed consent, the patient was brought to the  cath lab and was placed on the fluoroscopy table.  The right groin was  prepped and draped in usual fashion.  2% Xylocaine was used for local  anesthesia in the right groin. With the help of a thin wall needle, 6-French  arterial sheath was placed.  The sheath was aspirated and flushed.  Next, a  6-French left Judkins catheter was advanced over the wire under fluoroscopic  guidance up to the ascending aorta.  The wire was pulled out, the catheter  was aspirated and connected to the manifold.  The catheter was further  advanced and engaged into left coronary ostium.  Multiple  views of the left  system were taken.  Next, the catheter was disengaged and was pulled out  over the wire and was replaced with 6-French right Judkins catheter which  was advanced over the wire under fluoroscopic guidance up to the ascending  aorta.  The wire was pulled out, the catheter was aspirated and connected to  the manifold.  The catheter was further advanced and engaged into right  coronary ostium.  Multiple views of the right system were taken.  Next, the  catheter was disengaged and was pulled out over the wire and was replaced  with 6-French pigtail catheter which was advanced over the wire under  fluoroscopic guidance up to the ascending aorta.  The wire was pulled out,  the catheter was aspirated and connected to the manifold.  The catheter was  further advanced across aortic valve into the LV.  LV pressures  were  recorded.  Next, left ventriculography was done in 30 degree RAO position.  Post angiographic pressures were recorded from LV and then pull back  pressures were recorded from aorta.  There was no gradient across the aortic  valve.  Next, the pigtail catheter was pulled out over the wire.  The  sheaths were aspirated and flushed.   FINDINGS:  LV showed hyperdynamic LV systolic function, EF of 70-75%.   The left main was short which was patent.   The LAD has 20-25% ostial stenosis with haziness which is more apparent in  PA view as compared to LAO view.  The LAD appears to be widely patent in the  ostium.  There was also 15-20% proximal and mid stenosis with TIMI III  distal flow.  The vessel was calcified.  Diagonal one was very small which  was less than 1 mm.  Diagonal two has 10-15% ostial stenosis which was small  vessel.  Diagonal three was very small.   The left circumflex has 5-10% ostial and 10-15% proximal and 20-30% mid  stenosis.  OM1 and OM2 were very, very small. OM3 was moderate size which  was patent.  OM4 was very small. Posterolateral branch was small which were  patent.   The RCA was small which was nondominant vessel which was patent.   The patient tolerated procedure well.  There are no complications.  The  patient was transferred to recovery room in stable condition.  The patient  will be discharged home later this afternoon if remains hemodynamically  stable.  We will add low dose beta blockers as her heart rate tolerates and  will add ACE inhibitors.           ______________________________  Eduardo Osier Sharyn Lull, M.D.     MNH/MEDQ  D:  02/19/2006  T:  02/19/2006  Job:  [id number]   cc:   Larina Earthly, M.D.  Fax: [phone]   GMWNUU VOZ DGUY, M.D.  Fax: [phone]

## 2011-06-05 LAB — DIFFERENTIAL
Basophils Absolute: 0
Neutrophils Relative %: 88 — ABNORMAL HIGH

## 2011-06-05 LAB — CBC
HCT: 36.4
Hemoglobin: 12.1
MCHC: 33.2
MCV: 80.6
Platelets: 286
RBC: 4.3
RDW: 15.4 — ABNORMAL HIGH
WBC: 5.5

## 2011-06-05 LAB — I-STAT 8, (EC8 V) (CONVERTED LAB)
BUN: 20
Glucose, Bld: 147 — ABNORMAL HIGH
Hemoglobin: 14.3
Potassium: 4.3
Sodium: 137
TCO2: 28

## 2011-06-05 LAB — POCT I-STAT CREATININE
Creatinine, Ser: 0.7
Operator id: 265201

## 2011-06-05 LAB — BASIC METABOLIC PANEL
Chloride: 104
Creatinine, Ser: 0.64
GFR calc Af Amer: >60
Potassium: 4

## 2011-09-10 ENCOUNTER — Other Ambulatory Visit: Payer: Self-pay | Admitting: Internal Medicine

## 2011-09-10 DIAGNOSIS — R51 Headache: Secondary | ICD-10-CM

## 2011-09-11 ENCOUNTER — Ambulatory Visit
Admission: RE | Admit: 2011-09-11 | Discharge: 2011-09-11 | Disposition: A | Discharge status: Home / Self Care | Payer: Medicare Other | Source: Ambulatory Visit | Attending: Internal Medicine | Admitting: Internal Medicine

## 2011-09-11 DIAGNOSIS — R51 Headache: Secondary | ICD-10-CM

## 2012-07-19 ENCOUNTER — Other Ambulatory Visit: Payer: Self-pay | Admitting: Neurology

## 2012-07-19 DIAGNOSIS — R269 Unspecified abnormalities of gait and mobility: Secondary | ICD-10-CM

## 2012-07-30 ENCOUNTER — Ambulatory Visit
Admission: RE | Admit: 2012-07-30 | Discharge: 2012-07-30 | Disposition: A | Discharge status: Home / Self Care | Payer: Medicare Other | Source: Ambulatory Visit | Attending: Neurology | Admitting: Neurology

## 2012-07-30 DIAGNOSIS — R269 Unspecified abnormalities of gait and mobility: Secondary | ICD-10-CM

## 2012-09-17 ENCOUNTER — Ambulatory Visit: Payer: Medicare Other | Attending: Neurology | Admitting: Physical Therapy

## 2012-09-17 DIAGNOSIS — R269 Unspecified abnormalities of gait and mobility: Secondary | ICD-10-CM | POA: Insufficient documentation

## 2012-09-17 DIAGNOSIS — Z9181 History of falling: Secondary | ICD-10-CM | POA: Insufficient documentation

## 2012-09-17 DIAGNOSIS — IMO0001 Reserved for inherently not codable concepts without codable children: Secondary | ICD-10-CM | POA: Insufficient documentation

## 2012-09-17 DIAGNOSIS — R42 Dizziness and giddiness: Secondary | ICD-10-CM | POA: Insufficient documentation

## 2012-09-24 ENCOUNTER — Ambulatory Visit: Payer: Medicare Other | Admitting: Physical Therapy

## 2012-09-29 ENCOUNTER — Ambulatory Visit: Payer: Medicare Other | Attending: Neurology | Admitting: Physical Therapy

## 2012-09-29 DIAGNOSIS — R269 Unspecified abnormalities of gait and mobility: Secondary | ICD-10-CM | POA: Insufficient documentation

## 2012-09-29 DIAGNOSIS — Z9181 History of falling: Secondary | ICD-10-CM | POA: Insufficient documentation

## 2012-09-29 DIAGNOSIS — R42 Dizziness and giddiness: Secondary | ICD-10-CM | POA: Insufficient documentation

## 2012-09-29 DIAGNOSIS — IMO0001 Reserved for inherently not codable concepts without codable children: Secondary | ICD-10-CM | POA: Insufficient documentation

## 2012-10-01 ENCOUNTER — Ambulatory Visit: Payer: Medicare Other | Admitting: Physical Therapy

## 2012-10-06 ENCOUNTER — Ambulatory Visit: Payer: Medicare Other | Admitting: Physical Therapy

## 2012-10-08 ENCOUNTER — Ambulatory Visit: Payer: Medicare Other | Admitting: Physical Therapy

## 2012-10-13 ENCOUNTER — Ambulatory Visit: Payer: Medicare Other | Admitting: Physical Therapy

## 2012-10-15 ENCOUNTER — Ambulatory Visit: Payer: Medicare Other | Admitting: Physical Therapy

## 2012-10-19 ENCOUNTER — Ambulatory Visit: Payer: Medicare Other | Admitting: Physical Therapy

## 2012-10-20 ENCOUNTER — Ambulatory Visit: Payer: Medicare Other | Admitting: Physical Therapy

## 2012-10-22 ENCOUNTER — Ambulatory Visit: Payer: Medicare Other | Admitting: Physical Therapy

## 2012-10-27 ENCOUNTER — Ambulatory Visit: Payer: Medicare Other | Attending: Neurology | Admitting: Physical Therapy

## 2012-10-27 DIAGNOSIS — Z9181 History of falling: Secondary | ICD-10-CM | POA: Insufficient documentation

## 2012-10-27 DIAGNOSIS — R42 Dizziness and giddiness: Secondary | ICD-10-CM | POA: Insufficient documentation

## 2012-10-27 DIAGNOSIS — IMO0001 Reserved for inherently not codable concepts without codable children: Secondary | ICD-10-CM | POA: Insufficient documentation

## 2012-10-27 DIAGNOSIS — R269 Unspecified abnormalities of gait and mobility: Secondary | ICD-10-CM | POA: Insufficient documentation

## 2012-11-13 ENCOUNTER — Other Ambulatory Visit: Payer: Self-pay | Admitting: Neurology

## 2013-01-17 ENCOUNTER — Other Ambulatory Visit: Payer: Self-pay | Admitting: Neurology

## 2013-11-24 ENCOUNTER — Telehealth: Payer: Self-pay | Admitting: *Deleted

## 2013-11-24 MED ORDER — CLOPIDOGREL BISULFATE 75 MG PO TABS: 75.0000 mg | ORAL_TABLET | Freq: Every day | ORAL | Status: DC

## 2013-11-24 NOTE — Telephone Encounter (Signed)
Rx has been sent  

## 2013-11-25 ENCOUNTER — Telehealth: Payer: Self-pay | Admitting: *Deleted

## 2013-11-25 NOTE — Telephone Encounter (Signed)
Pt called and is experiencing some balance problems, similar to when last seen 11-04-12 (last note in centricity-see prn).  She will touch base with her pcp initially, then if needed will call back.  Forwarded to Dr. Pearlean BrownieSethi.

## 2013-12-23 ENCOUNTER — Other Ambulatory Visit: Payer: Self-pay | Admitting: Neurology

## 2014-01-31 ENCOUNTER — Other Ambulatory Visit: Payer: Self-pay | Admitting: Neurology

## 2014-02-01 ENCOUNTER — Other Ambulatory Visit: Payer: Self-pay | Admitting: Neurology

## 2014-03-07 ENCOUNTER — Other Ambulatory Visit: Payer: Self-pay | Admitting: Neurology

## 2014-03-07 NOTE — Telephone Encounter (Signed)
Called patient, left message asking they call back to schedule appt.

## 2014-03-22 ENCOUNTER — Telehealth: Payer: Self-pay | Admitting: Neurology

## 2014-03-22 ENCOUNTER — Other Ambulatory Visit: Payer: Self-pay | Admitting: Neurology

## 2014-03-22 MED ORDER — CLOPIDOGREL BISULFATE 75 MG PO TABS: ORAL_TABLET | ORAL | Status: DC

## 2014-03-22 NOTE — Telephone Encounter (Signed)
Patient called to schedule follow up with Dr. Pearlean BrownieSethi on 05/25/14 in order to get refills.

## 2014-03-22 NOTE — Telephone Encounter (Signed)
Last seen 10/2012.  Noted appt was needed on multiple refills, but one has not been made yet.

## 2014-03-22 NOTE — Telephone Encounter (Signed)
Patient has not been seen since March 2014.  I called and spoke with the patient.  She said she will call back to schedule appt.

## 2014-03-22 NOTE — Telephone Encounter (Signed)
Rx has been sent  

## 2014-05-25 ENCOUNTER — Ambulatory Visit: Payer: Self-pay | Admitting: Neurology

## 2014-06-08 ENCOUNTER — Ambulatory Visit (INDEPENDENT_AMBULATORY_CARE_PROVIDER_SITE_OTHER): Payer: Medicare Other | Admitting: Neurology

## 2014-06-08 ENCOUNTER — Encounter: Payer: Self-pay | Admitting: Neurology

## 2014-06-08 ENCOUNTER — Encounter (INDEPENDENT_AMBULATORY_CARE_PROVIDER_SITE_OTHER): Payer: Self-pay

## 2014-06-08 VITALS — BP 113/64 | HR 68 | Resp 16 | Ht 59.0 in | Wt 113.8 lb

## 2014-06-08 DIAGNOSIS — G3184 Mild cognitive impairment, so stated: Secondary | ICD-10-CM | POA: Insufficient documentation

## 2014-06-08 NOTE — Patient Instructions (Signed)
I had a long discussion with the patient and her husband and the counselor that her memory difficulties are likely due to age related mild cognitive impairment and not dementia. I encouraged her to do mentally challenging activities like playing bridge, solving crossword puzzles, sudoku. Start reservetrol supplement 500 mg 2 capsules daily. Return for followup in 6 months or call earlier if necessary  Mild Neurocognitive Disorder Mild neurocognitive disorder (formerly known as mild cognitive impairment) is a mental disorder. It is a slight abnormal decrease in mental function. The areas of mental function affected may include memory, thought, communication, behavior, and completion of tasks. The decrease is noticeable and measurable but for the most part does not interfere with your daily activities. Mild neurocognitive disorder typically occurs in people older than 60 years but can occur earlier. It is not as serious as major neurocognitive disorder (formerly known as dementia) but may lead to a more serious neurocognitive disorder. However, in some cases the condition does not get worse. A few people with this disorder even improve. CAUSES  There are a number of different causes of mild neurocognitive disorder:   Brain disorders associated with abnormal protein deposits, such as Alzheimer's disease, Pick's disease, and Lewy body disease.  Brain disorders associated with abnormal movement, such as Parkinson's disease and Huntington's disease.  Diseases affecting blood vessels in the brain and resulting in mini-strokes.  Certain infections, such as human immunodeficiency virus (HIV) infection.  Traumatic brain injury.  Other medical conditions such as brain tumors, underactive thyroid (hypothyroidism), and vitamin B12 deficiency.  Use of certain prescription medicine and "recreational" drugs. SYMPTOMS  Symptoms of mild neurocognitive disorder include:  Difficulty remembering. You may forget  details of recent events, names, or phone numbers. You may forget important social events and appointments or repeatedly forget where you put your car keys.  Difficulty thinking and solving problems. You may have trouble with complex tasks such as paying bills or driving in unfamiliar locations.  Difficulty communicating. You may have trouble finding the right word, naming an object, forming a sentence that makes sense, or understanding what you read or hear.  Changes in your behavior or personality. You may lose interest in the things that you used to enjoy or withdraw from social situations. You may get angry more easily than usual. You may act before thinking. You may do things in public that you would not usually do. You may hear or see things that are not real (hallucinations). You may believe falsely that others are trying to hurt you (paranoia). DIAGNOSIS Mild neurocognitive disorder is diagnosed through an assessment by your health care provider. Your health care provider will ask you and your family, friends, or coworkers questions about your symptoms. He or she will ask how often the symptoms occur, how long they have been occurring, whether they are getting worse, and the effect they are having on your life. Your health care provider may refer you to a neurologist or mental health specialist for a detailed evaluation of your mental functions (neuropsychological testing).  To identify the cause of your mild neurocognitive disorder, your health care provider may:  Obtain a detailed medical history.  Ask about alcohol and drug use, including prescription medicine.  Perform a physical exam.  Order blood tests and brain imaging exams. TREATMENT  Mild neurocognitive disorder caused by infections, use of certain medicines or "recreational" drugs, and certain medical conditions may improve with treatment of the condition that is causing the disorder. Mild neurocognitive disorder resulting  from other causes generally does not improve and may worsen. In these cases, the goal of treatment is to slow progression of the disorder and help you cope with the loss of mental function. Treatments in these cases include:   Medicine. Medicine helps mainly with memory loss and behavioral symptoms.   Talk therapy. Talk therapy provides education, emotional support, memory aids, and other ways of making up for decreases in mental function.   Lifestyle changes. These include regular exercise, a healthy diet (including essential omega-3 fatty acids), intellectual stimulation, and increased social interaction. Document Released: 04/13/2013 Document Revised: 12/26/2013 Document Reviewed: 04/13/2013 [facility] Patient Information 2015 [place], [place]. This information is not intended to replace advice given to you by your health care provider. Make sure you discuss any questions you have with your health care provider.

## 2014-06-08 NOTE — Progress Notes (Signed)
[facility] [address] [place] [id number] [phone]       OFFICE FOLLOW-UP NOTE  Ms. Molly SisSantosh D Portales Date of Birth:  26-Jun-1923 Medical Record Number:  [id number]   HPI:   [id number] year [name] with chronic dizziness and gait difficulties which are multifactorial from age-related brain white matter changes and degenerative cervical spine disease. Mild age related cognitive impairment Update 06/08/2014 : She is seen today for followup of her last visit with me on 11/04/2012. She remained stable without significant logical worsening. She continues to have balance and gait difficulties. She recently returned from a [place] to [place] and was very diligent about using a walker at all times and fortunately did not have any falls. She continues to have mild short-term memory difficulties and at times cannot complete sentences and feels frustrated about this. She does play bridge regularly and I did give her samples of Cerefolin nac at last visit but she did not fill the prescription. She complains of some stiffness in her back and hip muscles. She does at times go to the gym but not on a regular basis. ROS:   14 system review of systems is positive for  gait difficulty, imbalance, memory loss, difficulty speaking , back and hip stiffness and all other systems negative PMH:  Past Medical History  Diagnosis Date  . Diabetes   . Thalamic infarction     Social History:  History   Social History  . Marital Status: Married    Spouse Name: Psychologist, prison and probation servicesaiter    Number of Children: 3  . Years of Education: college   Occupational History  . retired    Social History Main Topics  . Smoking status: Never Smoker   . Smokeless tobacco: Not on file  . Alcohol Use: No  . Drug Use: No  . Sexual Activity: Not on file   Other Topics Concern  . Not on file   Social History Narrative  . No narrative on file    Medications:   Current Outpatient Prescriptions on File  Prior to Visit  Medication Sig Dispense Refill  . clopidogrel (PLAVIX) 75 MG tablet TAKE 1 TABLET BY MOUTH EVERY DAY  90 tablet  0   No current facility-administered medications on file prior to visit.    Allergies:   Allergies  Allergen Reactions  . Sulfonamide Derivatives     Physical Exam General: frail petite elderly lady, seated, in no evident distress Head: head normocephalic and atraumatic. Orohparynx benign Neck: supple with no carotid or supraclavicular bruits Cardiovascular: regular rate and rhythm, no murmurs Musculoskeletal: no deformity Skin:  no rash/petichiae Vascular:  Normal pulses all extremities Filed Vitals:   06/08/14 0932  BP: 113/64  Pulse: 68  Resp: 16   Neurologic Exam Mental Status: Awake and fully alert. Oriented to place and time. Recent and remote memory intact. Attention span, concentration and fund of knowledge appropriate.MMSE 29/30. Clock Drawing 4/4. Mood and affect appropriate. Geriatric depression Scale not depressed. Cranial Nerves: Fundoscopic exam not done . Pupils equal, briskly reactive to light. Extraocular movements full without nystagmus. Visual fields full to confrontation. Hearing intact. Facial sensation intact. Face, tongue, palate moves normally and symmetrically.  Motor: Normal bulk and tone. Normal strength in all tested extremity muscles. Sensory.: intact to touch and pinprick and vibratory sensation.  Coordination: Rapid alternating movements normal in all extremities. Finger-to-nose and heel-to-shin performed accurately bilaterally. Gait and Station: Arises from chair with mild difficulty. Stance is stooped. Gait  demonstrates slow short shuffling steps with narrow base. Unable to assess tandem walking. Reflexes: 1+ and symmetric. Toes downgoing.     ASSESSMENT: [age] year [place] with chronic dizziness and gait difficulties which are multifactorial from age-related brain white matter changes and degenerative  cervical spine disease. Mild age related cognitive impairment which appear stable    PLAN: I had a long discussion with the patient and her husband and the counselor that her memory difficulties are likely due to age related mild cognitive impairment and not dementia. I encouraged her to do mentally challenging activities like playing bridge, solving crossword puzzles, sudoku. Start reservetrol supplement 500 mg 2 capsules daily. I also gave her some back exercises to do regularly. Return for followup in 6 months or call earlier if necessary     Note: This document was prepared with digital dictation and possible smart phrase technology. Any transcriptional errors that result from this process are unintentional

## 2014-06-29 ENCOUNTER — Other Ambulatory Visit: Payer: Self-pay

## 2014-06-29 MED ORDER — CLOPIDOGREL BISULFATE 75 MG PO TABS: ORAL_TABLET | ORAL | Status: DC

## 2014-12-19 ENCOUNTER — Ambulatory Visit: Payer: Medicare Other | Admitting: Neurology

## 2015-01-17 ENCOUNTER — Other Ambulatory Visit: Payer: Self-pay | Admitting: Neurology

## 2015-03-28 ENCOUNTER — Ambulatory Visit (INDEPENDENT_AMBULATORY_CARE_PROVIDER_SITE_OTHER): Payer: Medicare Other | Admitting: Neurology

## 2015-03-28 ENCOUNTER — Encounter: Payer: Self-pay | Admitting: Neurology

## 2015-03-28 VITALS — BP 127/71 | HR 78 | Ht 59.0 in | Wt 112.0 lb

## 2015-03-28 DIAGNOSIS — G3184 Mild cognitive impairment, so stated: Secondary | ICD-10-CM | POA: Diagnosis not present

## 2015-03-28 NOTE — Patient Instructions (Signed)
I had a long discussion with the patient and husband regarding her mild cognitive impairment which is likely age-related but the patient seems concerned with possible recent worsening. I encouraged her to continue to take fish oil as well as reservatarol 200 mg once a day. Continue participation in cognitively challenging activities like playing bridge, solving crossword puzzles and sudoku. She was also counseled to use a cane while walking at all times to avoid falls and injuries. She will return for follow-up in 2 months for repeat cognitive testing and if there is further deterioration may consider treatment options and that visit.

## 2015-03-28 NOTE — Progress Notes (Signed)
[facility] [address] [place]. [name] [address] [phone]       OFFICE FOLLOW-UP NOTE  [name] Date of Birth:  10/22/1922 Medical Record Number:  [id number]   HPI:   59 year [place] lady with chronic dizziness and gait difficulties which are multifactorial from age-related brain white matter changes and degenerative cervical spine disease. Mild age related cognitive impairment Update 06/08/2014 : She is seen today for followup of her last visit with me on 11/04/2012. She remained stable without significant logical worsening. She continues to have balance and gait difficulties. She recently returned from a cruise to [place] and was very diligent about using a walker at all times and fortunately did not have any falls. She continues to have mild short-term memory difficulties and at times cannot complete sentences and feels frustrated about this. She does play bridge regularly and I did give her samples of Cerefolin nac at last visit but she did not fill the prescription. She complains of some stiffness in her back and hip muscles. She does at times go to the gym but not on a regular basis. Update 03/28/2015 : She returns for follow-up after last visit 9 months ago. She is accompanied by husband. She feels she has had some subjective worsening of her memory difficulties particularly in the last few days. She did have a flareup of her asthma and had to take inhalers for that. She is starting to feel better now. She continues to have difficulty walking with imbalance but she is been very careful and has not had any major falls. She recently celebrated her 89th per day and had a good time and was able to give a long speech without any problems. However she feels in the last few days she has to struggle to find words at times and her memory for recent events and remembering new information is not good. She does play bridge regularly as well as tries to go to the  gym frequently. ROS:   14 system review of systems is positive for  gait difficulty, imbalance, memory loss, difficulty speaking , nd all other systems negative PMH:  Past Medical History  Diagnosis Date  . Diabetes   . Thalamic infarction     Social History:  History   Social History  . Marital Status: Married    Spouse Name: [name]  . Number of Children: 3  . Years of Education: college   Occupational History  . retired    Social History Main Topics  . Smoking status: Never Smoker   . Smokeless tobacco: Not on file  . Alcohol Use: No  . Drug Use: No  . Sexual Activity: Not on file   Other Topics Concern  . Not on file   Social History Narrative    Medications:   Current Outpatient Prescriptions on File Prior to Visit  Medication Sig Dispense Refill  . Amlodipine-Olmesartan (AZOR PO) Take by mouth.    Marland Kitchen aspirin EC 81 MG tablet Take 81 mg by mouth daily.    . Calcium Carb-Cholecalciferol (CALCIUM 1000 + D PO) Take by mouth.    . clopidogrel (PLAVIX) 75 MG tablet TAKE 1 TABLET BY MOUTH EVERY DAY 90 tablet 0  . FLUZONE HIGH-DOSE 0.5 ML SUSY     . metoprolol succinate (TOPROL-XL) 25 MG 24 hr tablet     . Multiple Vitamin (MULTIVITAMIN) tablet Take 1 tablet by mouth daily.    . Omega-3 Fatty Acids (FISH OIL  EXTRA STRENGTH PO) Take by mouth 2 (two) times daily.    . ONE TOUCH ULTRA TEST test strip     . oxymetazoline (AFRIN) 0.05 % nasal spray Place 1 spray into both nostrils 2 (two) times daily.     No current facility-administered medications on file prior to visit.    Allergies:   Allergies  Allergen Reactions  . Sulfonamide Derivatives     Physical Exam General: frail petite elderly lady, seated, in no evident distress Head: head normocephalic and atraumatic.   Neck: supple with no carotid or supraclavicular bruits Cardiovascular: regular rate and rhythm, no murmurs Musculoskeletal: no deformity Skin:  no rash/petichiae Vascular:  Normal pulses all  extremities Filed Vitals:   03/28/15 1017  BP: 127/71  Pulse: 78   Neurologic Exam Mental Status: Awake and fully alert. Oriented to place and time. Recent and remote memory intact. Attention span, concentration and fund of knowledge appropriate.MMSE 25/30. Clock Drawing 2/4. Mood and affect appropriate. Geriatric depression Scale not done.AFT 4 Cranial Nerves: Fundoscopic exam not done . Pupils equal, briskly reactive to light. Extraocular movements full without nystagmus. Visual fields full to confrontation. Hearing intact. Facial sensation intact. Face, tongue, palate moves normally and symmetrically.  Motor: Normal bulk and tone. Normal strength in all tested extremity muscles. Sensory.: intact to touch and pinprick and vibratory sensation.  Coordination: Rapid alternating movements normal in all extremities. Finger-to-nose and heel-to-shin performed accurately bilaterally. Gait and Station: Arises from chair with mild difficulty. Stance is stooped. Gait demonstrates slow short shuffling steps with narrow base. Unable to assess tandem walking. Reflexes: 1+ and symmetric. Toes downgoing.     ASSESSMENT: 37 year [place] lady with chronic dizziness and gait difficulties which are multifactorial from age-related brain white matter changes and degenerative cervical spine disease. Mild age related cognitive impairment  with mild subjective recent worsening   PLAN: I had a long discussion with the patient and husband regarding her mild cognitive impairment which is likely age-related but the patient seems concerned with possible recent worsening. I encouraged her to continue to take fish oil as well as reservatarol 200 mg once a day. Continue participation in cognitively challenging activities like playing bridge, solving crossword puzzles and sudoku. She was also counseled to use a cane while walking at all times to avoid falls and injuries. She will return for follow-up in 2 months for  repeat cognitive testing and if there is further deterioration may consider treatment options and that visit.  Delia Heady, MD  Note: This document was prepared with digital dictation and possible smart phrase technology. Any transcriptional errors that result from this process are unintentional

## 2015-05-12 ENCOUNTER — Other Ambulatory Visit: Payer: Self-pay | Admitting: Neurology

## 2015-05-14 ENCOUNTER — Other Ambulatory Visit: Payer: Self-pay

## 2015-05-14 DIAGNOSIS — G459 Transient cerebral ischemic attack, unspecified: Secondary | ICD-10-CM

## 2015-05-14 NOTE — Telephone Encounter (Signed)
Yes ok to refill Plavix. She had a TIA about 3 years ago while visiting [place]

## 2015-05-15 ENCOUNTER — Telehealth: Payer: Self-pay

## 2015-05-15 MED ORDER — CLOPIDOGREL BISULFATE 75 MG PO TABS: 75.0000 mg | ORAL_TABLET | Freq: Every day | ORAL | Status: DC

## 2015-05-15 NOTE — Telephone Encounter (Signed)
Refilled plavix. Rx sent to pt pharmacy per Dr. Pearlean Brownie approval.   Called pt to let her know Rx plavix was refilled and sent to her pharmacy. She verbalized understanding.

## 2015-05-15 NOTE — Telephone Encounter (Signed)
LFt vm for patient regarding her Plavix refill. Rn stated that the medication is due for pick up.

## 2015-06-06 ENCOUNTER — Encounter: Payer: Self-pay | Admitting: Neurology

## 2015-06-06 ENCOUNTER — Ambulatory Visit (INDEPENDENT_AMBULATORY_CARE_PROVIDER_SITE_OTHER): Payer: Medicare Other | Admitting: Neurology

## 2015-06-06 ENCOUNTER — Ambulatory Visit: Payer: Medicare Other | Admitting: Neurology

## 2015-06-06 VITALS — BP 148/74 | HR 77 | Ht 59.0 in | Wt 111.2 lb

## 2015-06-06 DIAGNOSIS — G3184 Mild cognitive impairment, so stated: Secondary | ICD-10-CM | POA: Diagnosis not present

## 2015-06-06 NOTE — Patient Instructions (Signed)
I had a long discussion with the patient and her husband regarding her memory loss and mild cognitive impairment which and answered questions. I encouraged her to increase participation in cognitively challenging activities like playing bridge, solving crossword puzzles, sudoku. I also had discussion with her about fall risk prevention and advise her to change from cane to using a walker for safety purposes. She was asked to return for follow-up in 6 months or call earlier if necessary.

## 2015-06-06 NOTE — Progress Notes (Signed)
[facility] [address] [place]. [place] [address] [phone]       OFFICE FOLLOW-UP NOTE  [name] Date of Birth:  02-13-1923 Medical Record Number:  [id number]   HPI:   59 year [place] lady with chronic dizziness and gait difficulties which are multifactorial from age-related brain white matter changes and degenerative cervical spine disease. Mild age related cognitive impairment Update 06/08/2014 : She is seen today for followup of her last visit with me on 11/04/2012. She remained stable without significant logical worsening. She continues to have balance and gait difficulties. She recently returned from a cruise to [place] and was very diligent about using a walker at all times and fortunately did not have any falls. She continues to have mild short-term memory difficulties and at times cannot complete sentences and feels frustrated about this. She does play bridge regularly and I did give her samples of Cerefolin nac at last visit but she did not fill the prescription. She complains of some stiffness in her back and hip muscles. She does at times go to the gym but not on a regular basis. Update 03/28/2015 : She returns for follow-up after last visit 9 months ago. She is accompanied by husband. She feels she has had some subjective worsening of her memory difficulties particularly in the last few days. She did have a flareup of her asthma and had to take inhalers for that. She is starting to feel better now. She continues to have difficulty walking with imbalance but she is been very careful and has not had any major falls. She recently celebrated her 89th per day and had a good time and was able to give a long speech without any problems. However she feels in the last few days she has to struggle to find words at times and her memory for recent events and remembering new information is not good. She does play bridge regularly as well as tries to go to the  gym frequently. Update 06/06/2015 :  She returns for follow-up after last visit 2 months ago. She continues to have mild short-term memory difficulties which appear unchanged. She did not follow my instructions at last visit and has not filled the prescription was withdrawal. He continues to [employer] and forgets recent information and needs constant reminders. She also continues to have some gait and balance difficulties and most of the time she does all right when she uses a cane. Occasionally when she forgets to use it she may have some balance problems. She had one minor fall when she missed stepped at the curb but fortunately did not have any injury. She complains of fatigue, generalized weakness and muscle stiffness. ROS:   14 system review of systems is positive for  gait difficulty, imbalance, memory loss, difficulty speaking, fatigue, weakness, muscle tightness , and all other systems negative PMH:  Past Medical History  Diagnosis Date  . Diabetes (HCC)   . Thalamic infarction (HCC)   . Hearing loss     Social History:  Social History   Social History  . Marital Status: Married    Spouse Name: Psychologist, prison and probation services  . Number of Children: 3  . Years of Education: college   Occupational History  . retired    Social History Main Topics  . Smoking status: Never Smoker   . Smokeless tobacco: Not on file  . Alcohol Use: No  . Drug Use: No  . Sexual Activity: Not on file  Other Topics Concern  . Not on file   Social History Narrative    Medications:   Current Outpatient Prescriptions on File Prior to Visit  Medication Sig Dispense Refill  . Amlodipine-Olmesartan (AZOR PO) Take by mouth.    Marland Kitchen. aspirin EC 81 MG tablet Take 81 mg by mouth daily.    . Calcium Carb-Cholecalciferol (CALCIUM 1000 + D PO) Take by mouth.    . clopidogrel (PLAVIX) 75 MG tablet Take 1 tablet (75 mg total) by mouth daily. 90 tablet 0  . FLUZONE HIGH-DOSE 0.5 ML SUSY     . glimepiride (AMARYL) 1 MG tablet      . metoprolol succinate (TOPROL-XL) 25 MG 24 hr tablet     . Multiple Vitamin (MULTIVITAMIN) tablet Take 1 tablet by mouth daily.    . Omega-3 Fatty Acids (FISH OIL EXTRA STRENGTH PO) Take by mouth 2 (two) times daily.    . ONE TOUCH ULTRA TEST test strip     . oxymetazoline (AFRIN) 0.05 % nasal spray Place 1 spray into both nostrils 2 (two) times daily.    Marland Kitchen. PROAIR HFA 108 (90 BASE) MCG/ACT inhaler INHALE 2 PUFFS EVERY 4 HOURS AS NEEDED.  6   No current facility-administered medications on file prior to visit.    Allergies:   Allergies  Allergen Reactions  . Sulfonamide Derivatives     Physical Exam General: frail petite elderly lady, seated, in no evident distress Head: head normocephalic and atraumatic.   Neck: supple with no carotid or supraclavicular bruits Cardiovascular: regular rate and rhythm, no murmurs Musculoskeletal: no deformity Skin:  no rash/petichiae Vascular:  Normal pulses all extremities Filed Vitals:   06/06/15 1421  BP: 148/74  Pulse: 77   Neurologic Exam Mental Status: Awake and fully alert. Oriented to place and time. Recent and remote memory intact. Attention span, concentration and fund of knowledge appropriate.MMSE 29/30 ( last visit 25/30). Clock Drawing 2/4. Mood and affect appropriate. Geriatric depression Scale 3 not depressed.Marland Kitchen.AFT 4 Cranial Nerves: Fundoscopic exam not done . Pupils equal, briskly reactive to light. Extraocular movements full without nystagmus. Visual fields full to confrontation. Hearing intact. Facial sensation intact. Face, tongue, palate moves normally and symmetrically.  Motor: Normal bulk and tone. Normal strength in all tested extremity muscles. Sensory.: intact to touch and pinprick and vibratory sensation.  Coordination: Rapid alternating movements normal in all extremities. Finger-to-nose and heel-to-shin performed accurately bilaterally. Gait and Station: Arises from chair with mild difficulty. Stance is stooped. Gait  demonstrates slow short shuffling steps with narrow base. Unable to assess tandem walking. Reflexes: 1+ and symmetric. Toes downgoing.     ASSESSMENT: [age] year [place] lady with chronic dizziness and gait difficulties which are multifactorial from age-related brain white matter changes and degenerative cervical spine disease. Mild age related cognitive impairment  Which is stable  PLAN: I had a long discussion with the patient and her husband regarding her memory loss and mild cognitive impairment which and answered questions. I encouraged her to increase participation in cognitively challenging activities like playing bridge, solving crossword puzzles, sudoku. I also had discussion with her about fall risk prevention and advise her to change from cane to using a walker for safety purposes. She was asked to return for follow-up in 6 months or call earlier if necessary. Delia Heady.  Pramod Sethi, MD  Note: This document was prepared with digital dictation and possible smart phrase technology. Any transcriptional errors that result from this process are unintentional

## 2015-12-05 ENCOUNTER — Ambulatory Visit: Payer: Medicare Other | Admitting: Neurology

## 2016-01-02 ENCOUNTER — Other Ambulatory Visit (INDEPENDENT_AMBULATORY_CARE_PROVIDER_SITE_OTHER): Payer: Medicare Other

## 2016-01-02 ENCOUNTER — Telehealth: Payer: Self-pay | Admitting: Internal Medicine

## 2016-01-02 DIAGNOSIS — R0600 Dyspnea, unspecified: Secondary | ICD-10-CM

## 2016-01-02 LAB — BASIC METABOLIC PANEL
BUN: 29 mg/dL — ABNORMAL HIGH (ref 6–23)
CALCIUM: 9.6 mg/dL (ref 8.4–10.5)
CO2: 30 mEq/L (ref 19–32)
CREATININE: 0.72 mg/dL (ref 0.40–1.20)
Chloride: 101 mEq/L (ref 96–112)
GFR: 80.73 mL/min (ref >60.00)
GLUCOSE: 112 mg/dL — AB (ref 70–99)
Potassium: 4.5 mEq/L (ref 3.5–5.1)
SODIUM: 137 meq/L (ref 135–145)

## 2016-01-02 LAB — CBC
HEMATOCRIT: 36.1 % (ref 36.0–46.0)
HEMOGLOBIN: 12 g/dL (ref 12.0–15.0)
MCHC: 33.2 g/dL (ref 30.0–36.0)
MCV: 79.9 fl (ref 78.0–100.0)
PLATELETS: 272 10*3/uL (ref 150.0–400.0)
RBC: 4.52 Mil/uL (ref 3.87–5.11)
RDW: 15.8 % — ABNORMAL HIGH (ref 11.5–15.5)
WBC: 5 10*3/uL (ref 4.0–10.5)

## 2016-01-02 NOTE — Telephone Encounter (Signed)
Order placed, pt is in the office now and is aware to go to basement for blood work. Nothing further needed at this time.

## 2016-01-02 NOTE — Telephone Encounter (Signed)
[name] is with her husband and she told me that she had oral bleeding foor few hours following dental procedure few weeks ago. Since then more dyspnea and fatigue  Plan - check cbc, bmet, 01/02/2016 and we can call her with results  Dr. Kalman ShanMurali Abimael Zeiter, M.D., [facility] Pulmonary and Critical Care Medicine Staff Physician [facility] Pager: [phone], If no answer or between  15:00h - 7:00h: call [phone]  01/02/2016 3:17 PM

## 2016-01-03 ENCOUNTER — Telehealth: Payer: Self-pay | Admitting: Internal Medicine

## 2016-01-03 NOTE — Telephone Encounter (Signed)
Called and spoke to pt. Line was disconnected after briefly speaking with pt. WCB.

## 2016-01-03 NOTE — Telephone Encounter (Signed)
Let aunty [name] know that hemoglobin ok but she might be on the dry side with high BUN. Drink lot of water. Please send labs to pCP Hoyle SauerAVVA,RAVISANKAR R, MD   Dr. Kalman ShanMurali Oluwatoni Rotunno, M.D., [facility] Pulmonary and Critical Care Medicine Staff Physician [facility] Pager: [phone], If no answer or between  15:00h - 7:00h: call [phone]  01/03/2016 8:28 AM       PULMONARY No results for input(s): PHART, PCO2ART, PO2ART, HCO3, TCO2, O2SAT in the last 168 hours.  Invalid input(s): PCO2, PO2  CBC  Recent Labs Lab 01/02/16 1607  HGB 12.0  HCT 36.1  WBC 5.0  PLT 272.0    COAGULATION No results for input(s): INR in the last 168 hours.  CARDIAC  No results for input(s): TROPONINI in the last 168 hours. No results for input(s): PROBNP in the last 168 hours.   CHEMISTRY  Recent Labs Lab 01/02/16 1607  NA 137  K 4.5  CL 101  CO2 30  GLUCOSE 112*  BUN 29*  CREATININE 0.72  CALCIUM 9.6   CrCl cannot be calculated (Unknown ideal weight.).   LIVER No results for input(s): AST, ALT, ALKPHOS, BILITOT, PROT, ALBUMIN, INR in the last 168 hours.   INFECTIOUS No results for input(s): LATICACIDVEN, PROCALCITON in the last 168 hours.   ENDOCRINE CBG (last 3)  No results for input(s): GLUCAP in the last 72 hours.       IMAGING x48h  - image(s) personally visualized  -   highlighted in bold No results found.

## 2016-01-03 NOTE — Telephone Encounter (Signed)
Patient called back, CB is [phone].

## 2016-01-03 NOTE — Telephone Encounter (Signed)
Results have been explained to patient, pt expressed understanding. Nothing further needed.  

## 2016-01-17 ENCOUNTER — Other Ambulatory Visit: Payer: Self-pay | Admitting: Neurology

## 2016-02-13 ENCOUNTER — Telehealth: Payer: Self-pay

## 2016-02-13 NOTE — Telephone Encounter (Signed)
LFT vm for patient to schedule follow up with Dr.Sethi or NP. Pt is currently getting plavix refills and cancel appt in April 2017 with Dr. Pearlean BrownieSethi. Rn will send letter to patient.

## 2016-02-14 ENCOUNTER — Other Ambulatory Visit: Payer: Self-pay

## 2016-02-14 MED ORDER — CLOPIDOGREL BISULFATE 75 MG PO TABS: ORAL_TABLET | ORAL | Status: DC

## 2016-02-14 NOTE — Telephone Encounter (Signed)
Refill done for Plavix until August 2017.Pt cancel appt in April and never reschedule. Pt reschedule with Dr.Sethi.

## 2016-02-14 NOTE — Telephone Encounter (Signed)
Pt called, scheduled appt for 8/18. Operator relayed to pt she must keep her appt or there will be no more refills until she is seen per Katrina. Pt said "I understand"

## 2016-02-28 ENCOUNTER — Other Ambulatory Visit: Payer: Self-pay | Admitting: Gastroenterology

## 2016-02-28 DIAGNOSIS — R1310 Dysphagia, unspecified: Secondary | ICD-10-CM

## 2016-03-10 ENCOUNTER — Telehealth: Payer: Self-pay | Admitting: Internal Medicine

## 2016-03-10 DIAGNOSIS — R490 Dysphonia: Secondary | ICD-10-CM

## 2016-03-10 NOTE — Telephone Encounter (Signed)
Order entered for ENT referral. Nothing further needed.

## 2016-03-10 NOTE — Telephone Encounter (Signed)
pls refer allng with her husband to ENT Dr Dallie PilesEogh for voice hoarseness and lump in throat sensation

## 2016-04-11 ENCOUNTER — Ambulatory Visit: Payer: Medicare Other | Admitting: Neurology

## 2016-04-23 ENCOUNTER — Ambulatory Visit: Payer: Medicare Other | Admitting: Neurology

## 2016-04-30 ENCOUNTER — Telehealth: Payer: Self-pay | Admitting: Neurology

## 2016-04-30 ENCOUNTER — Other Ambulatory Visit: Payer: Self-pay

## 2016-04-30 MED ORDER — CLOPIDOGREL BISULFATE 75 MG PO TABS: ORAL_TABLET | ORAL | 1 refills | Status: DC

## 2016-04-30 NOTE — Telephone Encounter (Signed)
error 

## 2016-04-30 NOTE — Telephone Encounter (Signed)
Rn call the pharmacy [facility] Rn stated patient has cancel three appts in this year. Rn stated [facility] policy is that patient has to be seen yearly for refills. Rn stated pt has to be seen at appt before any future refills can be done. Pt given refill until appt. Kashawnda verbalized understanding.

## 2016-04-30 NOTE — Telephone Encounter (Signed)
Kashawnda/[facility] [address] [phone] request refill for clopidogrel (PLAVIX) 75 MG tablet

## 2016-06-13 ENCOUNTER — Other Ambulatory Visit: Payer: Self-pay | Admitting: Neurology

## 2016-06-16 ENCOUNTER — Other Ambulatory Visit: Payer: Self-pay

## 2016-06-16 MED ORDER — CLOPIDOGREL BISULFATE 75 MG PO TABS: ORAL_TABLET | ORAL | 0 refills | Status: DC

## 2016-07-03 ENCOUNTER — Ambulatory Visit (INDEPENDENT_AMBULATORY_CARE_PROVIDER_SITE_OTHER): Payer: Medicare Other | Admitting: Neurology

## 2016-07-03 ENCOUNTER — Encounter: Payer: Self-pay | Admitting: Neurology

## 2016-07-03 VITALS — BP 159/67 | HR 65 | Wt 111.6 lb

## 2016-07-03 DIAGNOSIS — R413 Other amnesia: Secondary | ICD-10-CM | POA: Diagnosis not present

## 2016-07-03 NOTE — Progress Notes (Signed)
[facility] [address] [place] [id number] [phone]       OFFICE FOLLOW-UP NOTE  [name] Date of Birth:  10/19/1922 Medical Record Number:  [id number]   HPI:   [id number] year [name] with chronic dizziness and gait difficulties which are multifactorial from age-related brain white matter changes and degenerative cervical spine disease. Mild age related cognitive impairment Update 06/08/2014 : She is seen today for followup of her last visit with me on 11/04/2012. She remained stable without significant logical worsening. She continues to have balance and gait difficulties. She recently returned from a cruise to [place] and was very diligent about using a walker at all times and fortunately did not have any falls. She continues to have mild short-term memory difficulties and at times cannot complete sentences and feels frustrated about this. She does play bridge regularly and I did give her samples of Cerefolin nac at last visit but she did not fill the prescription. She complains of some stiffness in her back and hip muscles. She does at times go to the gym but not on a regular basis. Update 03/28/2015 : She returns for follow-up after last visit 9 months ago. She is accompanied by husband. She feels she has had some subjective worsening of her memory difficulties particularly in the last few days. She did have a flareup of her asthma and had to take inhalers for that. She is starting to feel better now. She continues to have difficulty walking with imbalance but she is been very careful and has not had any major falls. She recently celebrated her 89th per day and had a good time and was able to give a long speech without any problems. However she feels in the last few days she has to struggle to find words at times and her memory for recent events and remembering new information is not good. She does play bridge regularly as well as tries to go to the  gym frequently. Update 06/06/2015 :  She returns for follow-up after last visit 2 months ago. She continues to have mild short-term memory difficulties which appear unchanged. She did not follow my instructions at last visit and has not filled the prescription was withdrawal. He continues to [employer]place objects and forgets recent information and needs constant reminders. She also continues to have some gait and balance difficulties and most of the time she does all right when she uses a cane. Occasionally when she forgets to use it she may have some balance problems. She had one minor fall when she missed stepped at the curb but fortunately did not have any injury. She complains of fatigue, generalized weakness and muscle stiffness. Update 07/03/2016 : She returns for follow-up  after last visit a year ago. She continues to have mild short-term memory difficulties but these appear stable. She has trouble with the recall and at times forgets names of people but may remember later. She still remains independent with all activities of daily living. She is a cane to walk. She is very conscious about her balance which is not good. She does go to the gym and has a Systems analystpersonal trainer whom she's cc couple of times a week. She has not been doing mentally challenging activities that had suggested. Primary care physician seems to have started on Aricept 5 mg a day which is tolerating well without any side effects. ROS:   14 system review of systems is positive for  gait difficulty,  imbalance, memory loss, difficulty speaking, fatigue, weakness,   , and all other systems negative PMH:  Past Medical History:  Diagnosis Date  . Diabetes (HCC)   . Hearing loss   . Thalamic infarction [facility](HCC)     Social History:  Social History   Social History  . Marital status: Married    Spouse name: Psychologist, prison and probation servicesaiter  . Number of children: 3  . Years of education: college   Occupational History  . retired    Social History Main Topics  .  Smoking status: Never Smoker  . Smokeless tobacco: Never Used  . Alcohol use No  . Drug use: No  . Sexual activity: Not on file   Other Topics Concern  . Not on file   Social History Narrative  . No narrative on file    Medications:   Current Outpatient Prescriptions on File Prior to Visit  Medication Sig Dispense Refill  . aspirin EC 81 MG tablet Take 81 mg by mouth daily.    . Calcium Carb-Cholecalciferol (CALCIUM 1000 + D PO) Take by mouth.    . clopidogrel (PLAVIX) 75 MG tablet TAKE 1 TABLET(75 MG) BY MOUTH DAILY 30 tablet 0  . glimepiride (AMARYL) 1 MG tablet     . metoprolol succinate (TOPROL-XL) 25 MG 24 hr tablet     . Multiple Vitamin (MULTIVITAMIN) tablet Take 1 tablet by mouth daily.    . Omega-3 Fatty Acids (FISH OIL EXTRA STRENGTH PO) Take by mouth 2 (two) times daily.    . ONE TOUCH ULTRA TEST test strip     . oxymetazoline (AFRIN) 0.05 % nasal spray Place 1 spray into both nostrils 2 (two) times daily.    Marland Kitchen. PROAIR HFA 108 (90 BASE) MCG/ACT inhaler INHALE 2 PUFFS EVERY 4 HOURS AS NEEDED.  6   No current facility-administered medications on file prior to visit.     Allergies:   Allergies  Allergen Reactions  . Sulfonamide Derivatives     Physical Exam General: frail petite elderly lady, seated, in no evident distress Head: head normocephalic and atraumatic.   Neck: supple with no carotid or supraclavicular bruits Cardiovascular: regular rate and rhythm, no murmurs Musculoskeletal: no deformity Skin:  no rash/petichiae Vascular:  Normal pulses all extremities Vitals:   07/03/16 1127  BP: (!) 159/67  Pulse: 65   Neurologic Exam Mental Status: Awake and fully alert. Oriented to place and time. Recent and remote memory intact. Attention span, concentration and fund of knowledge appropriate.MMSE [id number] ( last visit 29/30). Clock Drawing 3/4. Mood and affect appropriate. Geriatric depression Scale 3 not depressed..AFT 9 Cranial Nerves: Fundoscopic exam not  done . Pupils equal, briskly reactive to light. Extraocular movements full without nystagmus. Visual fields full to confrontation. Hearing intact. Facial sensation intact. Face, tongue, palate moves normally and symmetrically.  Motor: Normal bulk and tone. Normal strength in all tested extremity muscles. Sensory.: intact to touch and pinprick and vibratory sensation.  Coordination: Rapid alternating movements normal in all extremities. Finger-to-nose and heel-to-shin performed accurately bilaterally. Gait and Station: Arises from chair with mild difficulty. Stance is stooped. Gait demonstrates slow short shuffling steps with narrow base. Unable to assess tandem walking.  Reflexes: 1+ and symmetric. Toes downgoing.     ASSESSMENT: 89year [name] with chronic dizziness and gait difficulties which are multifactorial from age-related brain white matter changes and degenerative cervical spine disease. Mild age related cognitive impairment  which is stable  PLAN: I had a long discussion with the  patient and her husband regarding her memory loss and mild cognitive impairment which  remains stable and answered questions. Continue Aricept 5 mg daily I encouraged her to increase participation in cognitively challenging activities like playing bridge, solving crossword puzzles, sudoku. I also had discussion with her about fall risk prevention and advise her to use her  cane   for safety purposes. She was also encouraged to continue to work with her personal trainer to improve her balance. She will return for follow-up in the future in a year or call earlier if necessary .  Delia Heady, MD  Note: This document was prepared with digital dictation and possible smart phrase technology. Any transcriptional errors that result from this process are unintentional

## 2016-07-03 NOTE — Patient Instructions (Addendum)
Fall Prevention in the Home  Falls can cause injuries and can affect people from all age groups. There are many simple things that you can do to make your home safe and to help prevent falls. WHAT CAN I DO ON THE OUTSIDE OF MY HOME?  Regularly repair the edges of walkways and driveways and fix any cracks.  Remove high doorway thresholds.  Trim any shrubbery on the main path into your home.  Use bright outdoor lighting.  Clear walkways of debris and clutter, including tools and rocks.  Regularly check that handrails are securely fastened and in good repair. Both sides of any steps should have handrails.  Install guardrails along the edges of any raised decks or porches.  Have leaves, snow, and ice cleared regularly.  Use sand or salt on walkways during winter months.  In the garage, clean up any spills right away, including grease or oil spills. WHAT CAN I DO IN THE BATHROOM?  Use night lights.  Install grab bars by the toilet and in the tub and shower. Do not use towel bars as grab bars.  Use non-skid mats or decals on the floor of the tub or shower.  If you need to sit down while you are in the shower, use a plastic, non-slip stool..  Keep the floor dry. Immediately clean up any water that spills on the floor.  Remove soap buildup in the tub or shower on a regular basis.  Attach bath mats securely with double-sided non-slip rug tape.  Remove throw rugs and other tripping hazards from the floor. WHAT CAN I DO IN THE BEDROOM?  Use night lights.  Make sure that a bedside light is easy to reach.  Do not use oversized bedding that drapes onto the floor.  Have a firm chair that has side arms to use for getting dressed.  Remove throw rugs and other tripping hazards from the floor. WHAT CAN I DO IN THE KITCHEN?   Clean up any spills right away.  Avoid walking on wet floors.  Place frequently used items in easy-to-reach places.  If you need to reach for something  above you, use a sturdy step stool that has a grab bar.  Keep electrical cables out of the way.  Do not use floor polish or wax that makes floors slippery. If you have to use wax, make sure that it is non-skid floor wax.  Remove throw rugs and other tripping hazards from the floor. WHAT CAN I DO IN THE STAIRWAYS?  Do not leave any items on the stairs.  Make sure that there are handrails on both sides of the stairs. Fix handrails that are broken or loose. Make sure that handrails are as long as the stairways.  Check any carpeting to make sure that it is firmly attached to the stairs. Fix any carpet that is loose or worn.  Avoid having throw rugs at the top or bottom of stairways, or secure the rugs with carpet tape to prevent them from moving.  Make sure that you have a light switch at the top of the stairs and the bottom of the stairs. If you do not have them, have them installed. WHAT ARE SOME OTHER FALL PREVENTION TIPS?  Wear closed-toe shoes that fit well and support your feet. Wear shoes that have rubber soles or low heels.  When you use a stepladder, make sure that it is completely opened and that the sides are firmly locked. Have someone hold the ladder while you   are using it. Do not climb a closed stepladder.  Add color or contrast paint or tape to grab bars and handrails in your home. Place contrasting color strips on the first and last steps.  Use mobility aids as needed, such as canes, walkers, scooters, and crutches.  Turn on lights if it is dark. Replace any light bulbs that burn out.  Set up furniture so that there are clear paths. Keep the furniture in the same spot.  Fix any uneven floor surfaces.  Choose a carpet design that does not hide the edge of steps of a stairway.  Be aware of any and all pets.  Review your medicines with your healthcare provider. Some medicines can cause dizziness or changes in blood pressure, which increase your risk of falling. Talk  with your health care provider about other ways that you can decrease your risk of falls. This may include working with a physical therapist or trainer to improve your strength, balance, and endurance.   This information is not intended to replace advice given to you by your health care provider. Make sure you discuss any questions you have with your health care provider.   Document Released: 08/01/2002 Document Revised: 12/26/2014 Document Reviewed: 09/15/2014 Elsevier Interactive Patient Education 2016 Elsevier Inc.  

## 2016-07-28 ENCOUNTER — Telehealth: Payer: Self-pay | Admitting: Internal Medicine

## 2016-07-28 NOTE — Telephone Encounter (Signed)
Spoke with pt's son Sidd (dpr on file), pt c/o increased SOB, chest tightness, prod cough with white mucus, chest discomfort when taking deep inhale X5 days.  Pt has taken only flovent to help with symptoms.  Pt states she had an asthma attack approx 5 days ago.  Pt's son is in town and is requesting an appt with MR today as he is available to drive her to [place] today.    MR please advise.  Thanks.

## 2016-07-28 NOTE — Telephone Encounter (Signed)
I can see [name] 07/29/16 at 8.40am if [name] ok with it. Please let them know dueto my family issues I really wont be able to see her 07/28/2016 but definitely tomorrow AM. But son [name] can call me on my cell; you can give it to him . Call me after 2pm  Dr. Kalman ShanMurali Ashwini Jago, M.D., [facility].P Pulmonary and Critical Care Medicine Staff Physician [facility] [place] [facility] Pager: [phone], If no answer or between  15:00h - 7:00h: call [phone]  07/28/2016 12:01 PM

## 2016-07-28 NOTE — Telephone Encounter (Signed)
Called spoke with patient's son Sidd Appt scheduled with MR tomorrow 12.5.17 > had to double-book MR with note that pt will arrive at 8.40am MR's cell # given to Sidd with directions to call after 2pm Nothing further needed; will sign off

## 2016-07-29 ENCOUNTER — Ambulatory Visit (INDEPENDENT_AMBULATORY_CARE_PROVIDER_SITE_OTHER)
Admission: RE | Admit: 2016-07-29 | Discharge: 2016-07-29 | Disposition: A | Discharge status: Home / Self Care | Payer: Medicare Other | Source: Ambulatory Visit | Attending: Internal Medicine | Admitting: Internal Medicine

## 2016-07-29 ENCOUNTER — Ambulatory Visit (INDEPENDENT_AMBULATORY_CARE_PROVIDER_SITE_OTHER): Payer: Medicare Other | Admitting: Internal Medicine

## 2016-07-29 ENCOUNTER — Encounter: Payer: Self-pay | Admitting: Internal Medicine

## 2016-07-29 VITALS — BP 118/56 | HR 69 | Temp 97.8°F | Ht 59.0 in | Wt 109.8 lb

## 2016-07-29 DIAGNOSIS — R05 Cough: Secondary | ICD-10-CM | POA: Diagnosis not present

## 2016-07-29 DIAGNOSIS — R059 Cough, unspecified: Secondary | ICD-10-CM

## 2016-07-29 DIAGNOSIS — R0600 Dyspnea, unspecified: Secondary | ICD-10-CM

## 2016-07-29 MED ORDER — PREDNISONE 10 MG PO TABS: ORAL_TABLET | ORAL | 0 refills | Status: DC

## 2016-07-29 NOTE — Patient Instructions (Signed)
ICD-9-CM ICD-10-CM   1. Dyspnea, unspecified type 786.09 R06.00   2. Cough 786.2 R05    Likely asthma flare up  Plan Do CXR 07/29/2016 - will call with results Take prednisone 30 mg daily x 2 days, then 20mg  daily x 2 days, then 10mg  daily x 2 days, then 5mg  daily x 2 days and stop  Followup  2 weeks with me or an APP  - return sooner if needed

## 2016-07-29 NOTE — Progress Notes (Signed)
Subjective:     Patient ID: [name], female   DOB: 1923-01-03, 82 y.o.   MRN: [id number]  HPI    OV 07/29/2016  Chief Complaint  Patient presents with  . Acute Visit    Pt c/o increase in SOB, prod cough with white mucus, left sided chest pain when coughing. Pt denies f/c/s and swelling.    Acute visit. Brought by friend [place]. Last few to several days of worsening dyspnea followng sore throat and increased cough and white mucus. Class 3 levels. No fever. No wheeze. No edema.      has a past medical history of Diabetes (HCC); Hearing loss; and Thalamic infarction (HCC).   reports that she has never smoked. She has never used smokeless tobacco.  Past Surgical History:  Procedure Laterality Date  . fractured rib      Allergies  Allergen Reactions  . Sulfonamide Derivatives     Immunization History  Administered Date(s) Administered  . Influenza Whole 05/28/2009  . Influenza, High Dose Seasonal PF 06/25/2016    Family History  Problem Relation Age of Onset  . Cancer Mother   . Heart disease Sister   . Diabetes    . Stroke       Current Outpatient Prescriptions:  .  amLODipine-olmesartan (AZOR) 5-40 MG tablet, , Disp: , Rfl:  .  aspirin EC 81 MG tablet, Take 81 mg by mouth daily., Disp: , Rfl:  .  Calcium Carb-Cholecalciferol (CALCIUM 1000 + D PO), Take by mouth., Disp: , Rfl:  .  clopidogrel (PLAVIX) 75 MG tablet, TAKE 1 TABLET(75 MG) BY MOUTH DAILY, Disp: 30 tablet, Rfl: 0 .  donepezil (ARICEPT) 5 MG tablet, , Disp: , Rfl:  .  glimepiride (AMARYL) 1 MG tablet, , Disp: , Rfl:  .  metoprolol succinate (TOPROL-XL) 25 MG 24 hr tablet, , Disp: , Rfl:  .  mometasone (ELOCON) 0.1 % cream, , Disp: , Rfl:  .  Multiple Vitamin (MULTIVITAMIN) tablet, Take 1 tablet by mouth daily., Disp: , Rfl:  .  MYRBETRIQ 25 MG TB24 tablet, , Disp: , Rfl:  .  Omega-3 Fatty Acids (FISH OIL EXTRA STRENGTH PO), Take by mouth 2 (two) times daily., Disp: , Rfl:  .  ONE TOUCH ULTRA  TEST test strip, , Disp: , Rfl:  .  oxymetazoline (AFRIN) 0.05 % nasal spray, Place 1 spray into both nostrils 2 (two) times daily., Disp: , Rfl:  .  PROAIR HFA 108 (90 BASE) MCG/ACT inhaler, INHALE 2 PUFFS EVERY 4 HOURS AS NEEDED., Disp: , Rfl: 6   Review of Systems     Objective:   Physical Exam  Constitutional: She is oriented to person, place, and time. No distress.  frail  HENT:  Head: Normocephalic and atraumatic.  Right Ear: External ear normal.  Left Ear: External ear normal.  Mouth/Throat: Oropharynx is clear and moist. No oropharyngeal exudate.  Eyes: Conjunctivae and EOM are normal. Pupils are equal, round, and reactive to light. Right eye exhibits no discharge. Left eye exhibits no discharge. No scleral icterus.  Neck: Normal range of motion. Neck supple. No JVD present. No tracheal deviation present. No thyromegaly present.  Cardiovascular: Normal rate, regular rhythm, normal heart sounds and intact distal pulses.  Exam reveals no gallop and no friction rub.   No murmur heard. Pulmonary/Chest: Effort normal and breath sounds normal. No respiratory distress. She has no wheezes. She has no rales. She exhibits no tenderness.  cta basline purse lip breathing +  Abdominal: Soft. Bowel sounds are normal. She exhibits no distension and no mass. There is no tenderness. There is no rebound and no guarding.  Musculoskeletal: Normal range of motion. She exhibits no edema or tenderness.  Slow antalgic gait  Lymphadenopathy:    She has no cervical adenopathy.  Neurological: She is alert and oriented to person, place, and time. She has normal reflexes. No cranial nerve deficit. She exhibits normal muscle tone. Coordination normal.  Skin: Skin is warm and dry. No rash noted. She is not diaphoretic. No erythema. No pallor.  Psychiatric:  Reasonably intact cognition  Vitals reviewed.   Vitals:   07/29/16 0921  BP: (!) 118/56  Pulse: 69  Temp: 97.8 F (36.6 C)  TempSrc: Oral   SpO2: 97%  Weight: 109 lb 12.8 oz (49.8 kg)  Height: 4\' 11"  (1.499 m)    Estimated body mass index is 22.18 kg/m as calculated from the following:   Height as of this encounter: 4\' 11"  (1.499 m).   Weight as of this encounter: 109 lb 12.8 oz (49.8 kg).      Assessment:       ICD-9-CM ICD-10-CM   1. Dyspnea, unspecified type 786.09 R06.00 DG Chest 2 View  2. Cough 786.2 R05 DG Chest 2 View       Plan:     Likely asthma flare up  Plan Do CXR 07/29/2016 - will call with results Take prednisone 30 mg daily x 2 days, then 20mg  daily x 2 days, then 10mg  daily x 2 days, then 5mg  daily x 2 days and stop  Followup  2 weeks with me or an APP  - return sooner if needed    Dr. Kalman ShanMurali Sahana Boyland, M.D., [facility]C.P Pulmonary and Critical Care Medicine Staff Physician [facility] [place] [facility] Pager: [phone], If no answer or between  15:00h - 7:00h: call [phone]  07/31/2016 4:23 PM

## 2016-07-30 ENCOUNTER — Ambulatory Visit: Payer: Medicare Other | Admitting: Internal Medicine

## 2016-08-08 NOTE — Progress Notes (Signed)
lmtcb for pt.  

## 2016-08-14 ENCOUNTER — Encounter: Payer: Self-pay | Admitting: Adult Health

## 2016-08-14 ENCOUNTER — Ambulatory Visit
Admission: RE | Admit: 2016-08-14 | Discharge: 2016-08-14 | Disposition: A | Discharge status: Home / Self Care | Payer: Medicare Other | Source: Ambulatory Visit | Attending: Internal Medicine | Admitting: Internal Medicine

## 2016-08-14 ENCOUNTER — Other Ambulatory Visit (INDEPENDENT_AMBULATORY_CARE_PROVIDER_SITE_OTHER): Payer: Medicare Other

## 2016-08-14 ENCOUNTER — Ambulatory Visit (INDEPENDENT_AMBULATORY_CARE_PROVIDER_SITE_OTHER): Payer: Medicare Other | Admitting: Adult Health

## 2016-08-14 VITALS — BP 102/62 | HR 74 | Ht 59.0 in | Wt 110.4 lb

## 2016-08-14 DIAGNOSIS — R059 Cough, unspecified: Secondary | ICD-10-CM

## 2016-08-14 DIAGNOSIS — R05 Cough: Secondary | ICD-10-CM | POA: Diagnosis not present

## 2016-08-14 DIAGNOSIS — R0602 Shortness of breath: Secondary | ICD-10-CM

## 2016-08-14 DIAGNOSIS — J4521 Mild intermittent asthma with (acute) exacerbation: Secondary | ICD-10-CM | POA: Diagnosis not present

## 2016-08-14 LAB — BASIC METABOLIC PANEL
BUN: 21 mg/dL (ref 6–23)
CHLORIDE: 99 meq/L (ref 96–112)
CO2: 33 mEq/L — ABNORMAL HIGH (ref 19–32)
CREATININE: 0.7 mg/dL (ref 0.40–1.20)
Calcium: 9.3 mg/dL (ref 8.4–10.5)
GFR: 83.28 mL/min (ref >60.00)
Glucose, Bld: 135 mg/dL — ABNORMAL HIGH (ref 70–99)
POTASSIUM: 3.7 meq/L (ref 3.5–5.1)
Sodium: 137 mEq/L (ref 135–145)

## 2016-08-14 LAB — NITRIC OXIDE: NITRIC OXIDE: 41

## 2016-08-14 LAB — BRAIN NATRIURETIC PEPTIDE: Pro B Natriuretic peptide (BNP): 43 pg/mL (ref 0.0–100.0)

## 2016-08-14 MED ORDER — AEROCHAMBER MV MISC: 0 refills | Status: DC

## 2016-08-14 MED ORDER — MOMETASONE FURO-FORMOTEROL FUM 100-5 MCG/ACT IN AERO: 2.0000 | INHALATION_SPRAY | Freq: Two times a day (BID) | RESPIRATORY_TRACT | 0 refills | Status: AC

## 2016-08-14 NOTE — Assessment & Plan Note (Addendum)
Most likely flare with elevated nitric oxide  xopenex neb x 1  Check labs with bnp /cbc/bmp  Add LABA/ICS   Plan  Patient Instructions  Begin Dulera 100 2 puffs Twice daily  .  Use Proair 2 puffs every 6hr As needed  Wheezing . This is your rescue inhaler .  Labs today  follow up Dr. Marchelle Gearingamaswamy in 6 -8 weeks and As needed   Please contact office for sooner follow up if symptoms do not improve or worsen or seek emergency care

## 2016-08-14 NOTE — Addendum Note (Signed)
Addended by: Cydney OkAUGUSTIN, Basia Mcginty N on: 08/14/2016 04:29 PM   Modules accepted: Orders

## 2016-08-14 NOTE — Patient Instructions (Addendum)
Begin Dulera 100 2 puffs Twice daily  .  Use Proair 2 puffs every 6hr As needed  Wheezing . This is your rescue inhaler .  Labs today  follow up Dr. Marchelle Gearingamaswamy in 6 -8 weeks and As needed   Please contact office for sooner follow up if symptoms do not improve or worsen or seek emergency care

## 2016-08-14 NOTE — Addendum Note (Signed)
Addended by: Sheran LuzEAST, Mayreli Alden K on: 08/14/2016 05:35 PM   Modules accepted: Orders

## 2016-08-14 NOTE — Assessment & Plan Note (Signed)
Suspect asthma related  Will check bnp/bmet/cbc

## 2016-08-14 NOTE — Addendum Note (Signed)
Addended by: Cydney OkAUGUSTIN, Rain Wilhide N on: 08/14/2016 04:53 PM   Modules accepted: Orders

## 2016-08-14 NOTE — Progress Notes (Signed)
@Patient  ID: [name], female    DOB: [birth year], 82 y.o.   MRN: [id number]  Chief Complaint  Patient presents with  . Follow-up    dyspnea     Referring provider: Prince Solian, MD  HPI: 82 year old female never smoker followed for dyspnea /RAD /Asthma   TEST  PFTs 12/05/2009  show hyperinflation and air trapping but otherwise normal.  08/14/2016 Follow up : Asthma flare  Patient returns for a two-week follow-up. Patient was seen last visit with a  with possible asthma flare with chest tightness and sob. Pt says it felt like she was having an asthma attack . Chest x-ray last visit showed no acute changes. She was given a prednisone taper. Patient returns today and feels some better  But still is sob and gets winded with activity .  No chest pain, orthopnea, edmea, calf pain, fever. Or discolored mucus . No wheezing .   Exhaled nitric oxide test today is elevated at 41.      Allergies  Allergen Reactions  . Sulfonamide Derivatives     Immunization History  Administered Date(s) Administered  . Influenza Whole 05/28/2009  . Influenza, High Dose Seasonal PF 06/25/2016    Past Medical History:  Diagnosis Date  . Diabetes ([place])   . Hearing loss   . Thalamic infarction ([place])     Tobacco History: History  Smoking Status  . Never Smoker  Smokeless Tobacco  . Never Used   Counseling given: Not Answered   Outpatient Encounter Prescriptions as of 08/14/2016  Medication Sig  . amLODipine-olmesartan (AZOR) 5-40 MG tablet   . aspirin EC 81 MG tablet Take 81 mg by mouth daily.  . Calcium Carb-Cholecalciferol (CALCIUM 1000 + D PO) Take by mouth.  . clopidogrel (PLAVIX) 75 MG tablet TAKE 1 TABLET(75 MG) BY MOUTH DAILY  . donepezil (ARICEPT) 5 MG tablet   . glimepiride (AMARYL) 1 MG tablet   . metoprolol succinate (TOPROL-XL) 25 MG 24 hr tablet   . mometasone (ELOCON) 0.1 % cream   . Multiple Vitamin (MULTIVITAMIN) tablet Take 1 tablet by mouth daily.  Marland Kitchen  MYRBETRIQ 25 MG TB24 tablet   . Omega-3 Fatty Acids (FISH OIL EXTRA STRENGTH PO) Take by mouth 2 (two) times daily.  . ONE TOUCH ULTRA TEST test strip   . oxymetazoline (AFRIN) 0.05 % nasal spray Place 1 spray into both nostrils 2 (two) times daily.  . predniSONE (DELTASONE) 10 MG tablet Take 78m daily x 2 days, then 287mx 2 days, 1018m 2 days, then 5mg51m2 days and stop  . PROAIR HFA 108 (90 BASE) MCG/ACT inhaler INHALE 2 PUFFS EVERY 4 HOURS AS NEEDED.   No facility-administered encounter medications on file as of 08/14/2016.      Review of Systems  Constitutional:   No  weight loss, night sweats,  Fevers, chills, + fatigue, or  lassitude.  HEENT:   No headaches,  Difficulty swallowing,  Tooth/dental problems, or  Sore throat,                No sneezing, itching, ear ache, nasal congestion, post nasal drip,   CV:  No chest pain,  Orthopnea, PND, swelling in lower extremities, anasarca, dizziness, palpitations, syncope.   GI  No heartburn, indigestion, abdominal pain, nausea, vomiting, diarrhea, change in bowel habits, loss of appetite, bloody stools.   Resp:  .  No chest wall deformity  Skin: no rash or lesions.  GU: no dysuria, change in color  of urine, no urgency or frequency.  No flank pain, no hematuria   MS:  No joint pain or swelling.  No decreased range of motion.  No back pain.    Physical Exam  BP 102/62 (BP Location: Left Arm, Cuff Size: Normal)   Pulse (S) 74   Ht 4' 11"  (1.499 m)   Wt 110 lb 6.4 oz (50.1 kg)   SpO2 98%   BMI 22.30 kg/m   GEN: A/Ox3; pleasant , NAD, elderly    HEENT:  [place]/AT,  EACs-clear, TMs-wnl, NOSE-clear, THROAT-clear, no lesions, no postnasal drip or exudate noted.   NECK:  Supple w/ fair ROM; no JVD; normal carotid impulses w/o bruits; no thyromegaly or nodules palpated; no lymphadenopathy.    RESP  Clear  P & A; w/o, wheezes/ rales/ or rhonchi. no accessory muscle use, no dullness to percussion  CARD:  RRR, 1/6 SM  no peripheral  edema, pulses intact, no cyanosis or clubbing., neg cal pain /tenderness /neg homans   GI:   Soft & nt; nml bowel sounds; no organomegaly or masses detected.   Musco: Warm bil, no deformities or joint swelling noted.   Neuro: alert, no focal deficits noted.    Skin: Warm, no lesions or rashes  Psych:  No change in mood or affect. No depression or anxiety.  No memory loss.  Lab Results:  CBC    Component Value Date/Time   WBC 5.0 01/02/2016 1607   RBC 4.52 01/02/2016 1607   HGB 12.0 01/02/2016 1607   HCT 36.1 01/02/2016 1607   PLT 272.0 01/02/2016 1607   MCV 79.9 01/02/2016 1607   MCHC 33.2 01/02/2016 1607   RDW 15.8 (H) 01/02/2016 1607   LYMPHSABS 0.9 05/18/2007 1753   MONOABS 0.3 05/18/2007 1753   EOSABS 0.0 05/18/2007 1753   BASOSABS 0.0 05/18/2007 1753    BMET    Component Value Date/Time   NA 137 01/02/2016 1607   K 4.5 01/02/2016 1607   CL 101 01/02/2016 1607   CO2 30 01/02/2016 1607   GLUCOSE 112 (H) 01/02/2016 1607   BUN 29 (H) 01/02/2016 1607   CREATININE 0.72 01/02/2016 1607   CALCIUM 9.6 01/02/2016 1607   GFRNONAA >60 05/21/2007 0530   GFRAA  05/21/2007 0530    >60        The eGFR has been calculated using the MDRD equation. This calculation has not been validated in all clinical    BNP No results found for: BNP  ProBNP No results found for: PROBNP  Imaging: Dg Chest 2 View  Result Date: 07/29/2016 CLINICAL DATA:  Cough, congestion, shortness of breath EXAM: CHEST  2 VIEW COMPARISON:  Chest x-ray of 10/25/2009 FINDINGS: No active infiltrate or effusion is seen. The lungs remain somewhat hyperaerated. Slightly prominent pulmonary markings remain. Mediastinal and hilar contours are unremarkable. The heart is within upper limits normal. Coronary artery calcifications are present. There are degenerative changes in the thoracic spine and the bones are diffusely osteopenic. IMPRESSION: No change in hyper aeration and mild chronic change. No definite  active process. Coronary artery calcification. Electronically Signed   By: Ivar Drape M.D.   On: 07/29/2016 10:21     Assessment & Plan:   No problem-specific Assessment & Plan notes found for this encounter.     Rexene Edison, NP 08/14/2016

## 2016-08-14 NOTE — Addendum Note (Signed)
Addended by: Sheran LuzEAST, Giovanny Dugal K on: 08/14/2016 04:28 PM   Modules accepted: Orders

## 2016-08-14 NOTE — Progress Notes (Signed)
Patient seen in the office today and instructed on use of Dulera, Aerochamber.  Patient expressed understanding and demonstrated technique. Boone MasterJessica Wilman Tucker, CMA 08/14/16

## 2016-08-14 NOTE — Addendum Note (Signed)
Addended by: Sheran LuzEAST, Dory Verdun K on: 08/14/2016 04:14 PM   Modules accepted: Orders

## 2016-08-15 MED ORDER — AEROCHAMBER MV MISC: 0 refills | Status: AC

## 2016-08-15 NOTE — Progress Notes (Signed)
Spoke with pt and notified of results per Dr. TP. Pt verbalized understanding and denied any questions. 

## 2016-08-15 NOTE — Progress Notes (Signed)
Called and spoke to pt. Informed her of the results and recs per MR. Pt verbalized understanding and denied any further questions or concerns at this time.   

## 2016-08-15 NOTE — Addendum Note (Signed)
Addended by: Cydney OkAUGUSTIN, Tran Randle N on: 08/15/2016 09:27 AM   Modules accepted: Orders

## 2016-08-27 ENCOUNTER — Other Ambulatory Visit: Payer: Self-pay | Admitting: Neurology

## 2016-08-28 ENCOUNTER — Other Ambulatory Visit: Payer: Self-pay | Admitting: Neurology

## 2016-09-12 ENCOUNTER — Other Ambulatory Visit: Payer: Self-pay | Admitting: Neurology

## 2016-09-12 NOTE — Telephone Encounter (Signed)
Kindly advise patient to call her primary Md Dr Felipa EthAvva for the same

## 2016-09-12 NOTE — Telephone Encounter (Signed)
Patient requesting refill for amLODipine-olmesartan (AZOR) 5-40 MG tablet.

## 2016-09-15 NOTE — Telephone Encounter (Signed)
IF patient calls back she needs to contact her PCP Dr. Felipa EthAvva for her amlodipine refill. The medication is being manage by her PCP. This is per Dr. Pearlean BrownieSethi note below thanks.

## 2016-09-24 ENCOUNTER — Telehealth: Payer: Self-pay | Admitting: Internal Medicine

## 2016-09-24 NOTE — Telephone Encounter (Signed)
Tried to return call, phone just continued to ring,unable to leave a vm

## 2016-09-25 NOTE — Telephone Encounter (Signed)
Attempted to call the pt but the line was picked up and then disconnected.  Will try back later.

## 2016-09-26 ENCOUNTER — Ambulatory Visit: Payer: Medicare Other | Admitting: Internal Medicine

## 2016-09-26 NOTE — Telephone Encounter (Signed)
Attempted to contact the pt again, and the line was disconnected x 3. Will sign off per protocol.

## 2016-09-27 ENCOUNTER — Other Ambulatory Visit: Payer: Self-pay | Admitting: Neurology

## 2016-11-06 ENCOUNTER — Ambulatory Visit (INDEPENDENT_AMBULATORY_CARE_PROVIDER_SITE_OTHER): Payer: Medicare Other | Admitting: Neurology

## 2016-11-06 ENCOUNTER — Encounter: Payer: Self-pay | Admitting: Neurology

## 2016-11-06 VITALS — BP 165/72 | HR 63 | Wt 111.6 lb

## 2016-11-06 DIAGNOSIS — R269 Unspecified abnormalities of gait and mobility: Secondary | ICD-10-CM | POA: Diagnosis not present

## 2016-11-06 NOTE — Progress Notes (Signed)
[facility] [address] [place] [id number] [phone]       OFFICE FOLLOW-UP NOTE  [name] Date of Birth:  Jul 28, 1923 Medical Record Number:  [id number]   HPI:   89 year [name] with chronic dizziness and gait difficulties which are multifactorial from age-related brain white matter changes and degenerative cervical spine disease. Mild age related cognitive impairment Update 06/08/2014 : She is seen today for followup of her last visit with me on 11/04/2012. She remained stable without significant logical worsening. She continues to have balance and gait difficulties. She recently returned from a cruise to [place] and was very diligent about using a walker at all times and fortunately did not have any falls. She continues to have mild short-term memory difficulties and at times cannot complete sentences and feels frustrated about this. She does play bridge regularly and I did give her samples of Cerefolin nac at last visit but she did not fill the prescription. She complains of some stiffness in her back and hip muscles. She does at times go to the gym but not on a regular basis. Update 03/28/2015 : She returns for follow-up after last visit 9 months ago. She is accompanied by husband. She feels she has had some subjective worsening of her memory difficulties particularly in the last few days. She did have a flareup of her asthma and had to take inhalers for that. She is starting to feel better now. She continues to have difficulty walking with imbalance but she is been very careful and has not had any major falls. She recently celebrated her 89th per day and had a good time and was able to give a long speech without any problems. However she feels in the last few days she has to struggle to find words at times and her memory for recent events and remembering new information is not good. She does play bridge regularly as well as tries to go to the  gym frequently. Update 06/06/2015 :  She returns for follow-up after last visit 2 months ago. She continues to have mild short-term memory difficulties which appear unchanged. She did not follow my instructions at last visit and has not filled the prescription was withdrawal. He continues to [employer] Corporationmisplace objects and forgets recent information and needs constant reminders. She also continues to have some gait and balance difficulties and most of the time she does all right when she uses a cane. Occasionally when she forgets to use it she may have some balance problems. She had one minor fall when she missed stepped at the curb but fortunately did not have any injury. She complains of fatigue, generalized weakness and muscle stiffness. Update 07/03/2016 : She returns for follow-up  after last visit a year ago. She continues to have mild short-term memory difficulties but these appear stable. She has trouble with the recall and at times forgets names of people but may remember later. She still remains independent with all activities of daily living. She is a cane to walk. She is very conscious about her balance which is not good. She does go to the gym and has a Systems analystpersonal trainer whom she's cc couple of times a week. She has not been doing mentally challenging activities that had suggested. Primary care physician seems to have started on Aricept 5 mg a day which is tolerating well without any side effects. Update 11/06/2016 ; she returns for follow-up after last visit 5 months ago. She  called for a prescription of Plavix and was told it would not be given unless she was seen. She denies any new stroke or TIA symptoms. Her gait, balance and memory difficulties appear unchanged. She was doing quite well until a few weeks ago when she is now complaining of decreased energy, fatigue, weakness. She needs to push herself holding onto something in order to get out of a chair. She walks with a slow shuffling gait using a cane. She  has had no recent falls or injuries. She continues to see her primary physician Dr. Felipa Eth for medical issues and Dr. Marchelle Gearing for asthma. She and her husband recently celebrated their 65th wedding anniversary. He continues to be active and goes to the gym 3 times per week. However she has not been quite active mentally and does not play bridge or do mentally challenging activities on a regular basis ROS:   14 system review of systems is positive for  gait difficulty, imbalance, memory loss, difficulty speaking, fatigue, weakness, hearing loss, dizziness  , and all other systems negative PMH:  Past Medical History:  Diagnosis Date  . Diabetes ([facility])   . Hearing loss   . Thalamic infarction [facility])     Social History:  Social History   Social History  . Marital status: Married    Spouse name: Psychologist, prison and probation services  . Number of children: 3  . Years of education: college   Occupational History  . retired    Social History Main Topics  . Smoking status: Never Smoker  . Smokeless tobacco: Never Used  . Alcohol use No  . Drug use: No  . Sexual activity: Not on file   Other Topics Concern  . Not on file   Social History Narrative  . No narrative on file    Medications:   Current Outpatient Prescriptions on File Prior to Visit  Medication Sig Dispense Refill  . amLODipine-olmesartan (AZOR) 5-40 MG tablet     . Calcium Carb-Cholecalciferol (CALCIUM 1000 + D PO) Take by mouth.    . clopidogrel (PLAVIX) 75 MG tablet TAKE 1 TABLET BY MOUTH DAILY 30 tablet 0  . clopidogrel (PLAVIX) 75 MG tablet TAKE 1 TABLET BY MOUTH DAILY 30 tablet 1  . donepezil (ARICEPT) 5 MG tablet     . glimepiride (AMARYL) 1 MG tablet     . metoprolol succinate (TOPROL-XL) 25 MG 24 hr tablet     . mometasone (ELOCON) 0.1 % cream     . mometasone-formoterol (DULERA) 100-5 MCG/ACT AERO Inhale 2 puffs into the lungs 2 (two) times daily. 1 Inhaler 0  . Multiple Vitamin (MULTIVITAMIN) tablet Take 1 tablet by mouth daily.    Marland Kitchen  MYRBETRIQ 25 MG TB24 tablet     . Omega-3 Fatty Acids (FISH OIL EXTRA STRENGTH PO) Take by mouth 2 (two) times daily.    . ONE TOUCH ULTRA TEST test strip     . oxymetazoline (AFRIN) 0.05 % nasal spray Place 1 spray into both nostrils 2 (two) times daily.    Marland Kitchen PROAIR HFA 108 (90 BASE) MCG/ACT inhaler INHALE 2 PUFFS EVERY 4 HOURS AS NEEDED.  6  . Spacer/Aero-Holding Chambers (AEROCHAMBER MV) inhaler Use as instructed 1 each 0   No current facility-administered medications on file prior to visit.     Allergies:   Allergies  Allergen Reactions  . Sulfonamide Derivatives     Physical Exam General: frail petite elderly lady, seated, in no evident distress Head: head normocephalic and atraumatic.  Neck: supple with no carotid or supraclavicular bruits Cardiovascular: regular rate and rhythm, no murmurs Musculoskeletal: no deformity Skin:  no rash/petichiae Vascular:  Normal pulses all extremities Vitals:   11/06/16 1208  BP: (!) 165/72  Pulse: 63   Neurologic Exam Mental Status: Awake and fully alert. Oriented to place and time. Recent and remote memory intact. Attention span, concentration and fund of knowledge appropriate.MMSE not done Cranial Nerves: Fundoscopic exam not done . Pupils equal, briskly reactive to light. Extraocular movements full without nystagmus. Visual fields full to confrontation. Hearing intact. Facial sensation intact. Face, tongue, palate moves normally and symmetrically.  Motor: Normal bulk and tone. Normal strength in all tested extremity muscles. Sensory.: intact to touch and pinprick and vibratory sensation.  Coordination: Rapid alternating movements normal in all extremities. Finger-to-nose and heel-to-shin performed accurately bilaterally. Gait and Station: Arises from chair with mild difficulty. Stance is stooped. Gait demonstrates slow short shuffling steps with narrow base. Uses a 4 pronged cane Unable to assess tandem walking.  Reflexes: 1+ and  symmetric. Toes downgoing.     ASSESSMENT: 89year [place] lady with chronic dizziness and gait difficulties which are multifactorial from age-related brain white matter changes and degenerative cervical spine disease. Mild age related cognitive impairment  which is stable  PLAN: I had a long discussion with the patient and husband regarding her multifactorial gait difficulties due to combination of degenerative spine disease as well as cerebrovascular disease. I recommend she get up slowly and use a cane at all times. Continue Aricept in the current dose for her mild cognitive impairment and increase particpation in cognitively challenging activities like solving crossword puzzles, sudoku and playing bridge. Continue Plavix for stroke prevention and maintain strict control of hypertension with blood pressure goal below 130/90. Patient was advised to follow-up with her primary physician in the future for refills for Plavix and Aricept.Nno routine schedule appointment with me is necessary. She has some new complaints in the last few weeks of generalized weakness and tiredness I recommend she see her primary physician Dr.Avva to have this evaluated   soon to have this evaluated No scheduled f/u with me is necessary .  Delia Heady, MD  Note: This document was prepared with digital dictation and possible smart phrase technology. Any transcriptional errors that result from this process are unintentional

## 2016-11-06 NOTE — Patient Instructions (Addendum)
I had a long discussion with the patient and husband regarding her multifactorial gait difficulties due to combination of degenerative spine disease as well as cerebrovascular disease. I recommend she get up slowly and use a cane at all times. Continue Aricept in the current dose for her mild cognitive impairment and increase particpation in cognitively challenging activities like solving crossword puzzles, sudoku and playing bridge. Continue Plavix for stroke prevention and maintain strict control of hypertension with blood pressure goal below 130/90. Patient was advised to follow-up with her primary physician in the future for refills for Plavix and Aricept.Nno routine schedule appointment with me is necessary. She has some new complaints in the last few weeks of generalized weakness and tiredness I recommend she see her primary physician Dr.Avva to have this evaluated   soon to have this evaluated No scheduled f/u with me is necessary

## 2017-06-16 ENCOUNTER — Ambulatory Visit
Admission: RE | Admit: 2017-06-16 | Discharge: 2017-06-16 | Disposition: A | Discharge status: Home / Self Care | Payer: Medicare Other | Source: Ambulatory Visit | Attending: Internal Medicine | Admitting: Internal Medicine

## 2017-06-16 ENCOUNTER — Other Ambulatory Visit: Payer: Self-pay | Admitting: Internal Medicine

## 2017-06-16 DIAGNOSIS — R410 Disorientation, unspecified: Secondary | ICD-10-CM

## 2017-06-16 DIAGNOSIS — I639 Cerebral infarction, unspecified: Secondary | ICD-10-CM

## 2017-06-22 ENCOUNTER — Ambulatory Visit
Admission: RE | Admit: 2017-06-22 | Discharge: 2017-06-22 | Disposition: A | Discharge status: Home / Self Care | Payer: Medicare Other | Source: Ambulatory Visit | Attending: Internal Medicine | Admitting: Internal Medicine

## 2017-06-22 DIAGNOSIS — I639 Cerebral infarction, unspecified: Secondary | ICD-10-CM

## 2017-06-22 DIAGNOSIS — R410 Disorientation, unspecified: Secondary | ICD-10-CM

## 2017-08-14 ENCOUNTER — Emergency Department (HOSPITAL_COMMUNITY)
Admission: EM | Admit: 2017-08-14 | Discharge: 2017-08-14 | Disposition: A | Discharge status: Home / Self Care | Payer: Medicare Other | Attending: Emergency Medicine | Admitting: Emergency Medicine

## 2017-08-14 ENCOUNTER — Emergency Department (HOSPITAL_COMMUNITY): Payer: Medicare Other

## 2017-08-14 ENCOUNTER — Encounter (HOSPITAL_COMMUNITY): Payer: Self-pay | Admitting: Emergency Medicine

## 2017-08-14 DIAGNOSIS — Y999 Unspecified external cause status: Secondary | ICD-10-CM | POA: Diagnosis not present

## 2017-08-14 DIAGNOSIS — S0101XA Laceration without foreign body of scalp, initial encounter: Secondary | ICD-10-CM | POA: Diagnosis not present

## 2017-08-14 DIAGNOSIS — W2209XA Striking against other stationary object, initial encounter: Secondary | ICD-10-CM | POA: Diagnosis not present

## 2017-08-14 DIAGNOSIS — Y939 Activity, unspecified: Secondary | ICD-10-CM | POA: Diagnosis not present

## 2017-08-14 DIAGNOSIS — Z79899 Other long term (current) drug therapy: Secondary | ICD-10-CM | POA: Diagnosis not present

## 2017-08-14 DIAGNOSIS — Z23 Encounter for immunization: Secondary | ICD-10-CM | POA: Insufficient documentation

## 2017-08-14 DIAGNOSIS — E119 Type 2 diabetes mellitus without complications: Secondary | ICD-10-CM | POA: Insufficient documentation

## 2017-08-14 DIAGNOSIS — W06XXXA Fall from bed, initial encounter: Secondary | ICD-10-CM

## 2017-08-14 DIAGNOSIS — Y92009 Unspecified place in unspecified non-institutional (private) residence as the place of occurrence of the external cause: Secondary | ICD-10-CM

## 2017-08-14 DIAGNOSIS — J45909 Unspecified asthma, uncomplicated: Secondary | ICD-10-CM | POA: Diagnosis not present

## 2017-08-14 DIAGNOSIS — S0990XA Unspecified injury of head, initial encounter: Secondary | ICD-10-CM | POA: Diagnosis present

## 2017-08-14 DIAGNOSIS — I10 Essential (primary) hypertension: Secondary | ICD-10-CM | POA: Insufficient documentation

## 2017-08-14 DIAGNOSIS — Y929 Unspecified place or not applicable: Secondary | ICD-10-CM | POA: Diagnosis not present

## 2017-08-14 MED ORDER — TETANUS-DIPHTH-ACELL PERTUSSIS 5-2.5-18.5 LF-MCG/0.5 IM SUSP
0.5000 mL | Freq: Once | INTRAMUSCULAR | Status: AC
  Administered 2017-08-14: 0.5 mL via INTRAMUSCULAR
  Filled 2017-08-14: qty 0.5

## 2017-08-14 NOTE — ED Provider Notes (Signed)
[facility] Provider [facility]   CSN: [id number] Arrival date & time: 08/14/17  [id number]     History   Chief Complaint Chief Complaint  Patient presents with  . Fall  . Head Laceration    HPI Tangy D Scarlette ArChopra is a 83 y.o. female.  The history is provided by the patient.  She rolled out of bed and hit her head on a nightstand causing a laceration.  She is not sure when her last tetanus immunization was.  Of note, she does take clopidogrel, but no systemic anticoagulants.  She denies other injury.  Past history includes diabetes, hypertension, hyperlipidemia, and stroke.  Past Medical History:  Diagnosis Date  . Diabetes (HCC)   . Hearing loss   . Thalamic infarction [facility](HCC)     Patient Active Problem List   Diagnosis Date Noted  . Mild cognitive impairment with memory loss 06/08/2014  . SORE THROAT 04/10/2010  . Dyspnea 10/25/2009  . COUGH 10/25/2009  . HYPERLIPIDEMIA 10/24/2009  . DEPRESSION 10/24/2009  . HYPERTENSION 10/24/2009  . CAD 10/24/2009  . ALLERGIC RHINITIS, SEASONAL 10/24/2009  . Asthma 10/24/2009  . OSTEOPENIA 10/24/2009    Past Surgical History:  Procedure Laterality Date  . fractured rib      OB History    No data available       Home Medications    Prior to Admission medications   Medication Sig Start Date End Date Taking? Authorizing Provider  amLODipine-olmesartan (AZOR) 5-40 MG tablet  06/13/16   [provider]  Calcium Carb-Cholecalciferol (CALCIUM 1000 + D PO) Take by mouth.    [provider]  clopidogrel (PLAVIX) 75 MG tablet TAKE 1 TABLET BY MOUTH DAILY 08/28/16   Micki RileySethi, Pramod S, MD  clopidogrel (PLAVIX) 75 MG tablet TAKE 1 TABLET BY MOUTH DAILY 09/29/16   Micki RileySethi, Pramod S, MD  donepezil (ARICEPT) 5 MG tablet  06/13/16   [provider]  glimepiride (AMARYL) 1 MG tablet  02/02/15   [provider]  metoprolol succinate (TOPROL-XL) 25 MG 24 hr tablet  05/20/14    [provider]  mometasone (ELOCON) 0.1 % cream  05/03/16   [provider]  mometasone-formoterol (DULERA) 100-5 MCG/ACT AERO Inhale 2 puffs into the lungs 2 (two) times daily. 08/14/16   Parrett, Virgel Bouquetammy S, NP  Multiple Vitamin (MULTIVITAMIN) tablet Take 1 tablet by mouth daily.    [provider]  MYRBETRIQ 25 MG TB24 tablet  06/13/16   [provider]  Omega-3 Fatty Acids (FISH OIL EXTRA STRENGTH PO) Take by mouth 2 (two) times daily.    [provider]  ONE TOUCH ULTRA TEST test strip  04/21/14   [provider]  oxymetazoline (AFRIN) 0.05 % nasal spray Place 1 spray into both nostrils 2 (two) times daily.    [provider]  PROAIR HFA 108 (90 BASE) MCG/ACT inhaler INHALE 2 PUFFS EVERY 4 HOURS AS NEEDED. 02/21/15   [provider]  Spacer/Aero-Holding Chambers (AEROCHAMBER MV) inhaler Use as instructed 08/15/16   Parrett, Virgel Bouquetammy S, NP    Family History Family History  Problem Relation Age of Onset  . Cancer Mother   . Heart disease Sister   . Diabetes Unknown   . Stroke Unknown     Social History Social History   Tobacco Use  . Smoking status: Never Smoker  . Smokeless tobacco: Never Used  Substance Use Topics  . Alcohol use: No  . Drug use: No  Allergies   Sulfonamide derivatives   Review of Systems Review of Systems  All other systems reviewed and are negative.    Physical Exam Updated Vital Signs BP 132/73 (BP Location: Right Arm)   Pulse 77   Temp 97.6 F (36.4 C) (Oral)   Resp (!) 25   SpO2 98%   Physical Exam  Nursing note and vitals reviewed.  83 year old female, resting comfortably and in no acute distress. Vital signs are significant for tachypnea. Oxygen saturation is 98%, which is normal. Head is normocephalic.  There is matted blood throughout the scalp.  When blood was cleaned off, laceration is seen in the frontal region of the scalp in the midline. PERRLA, EOMI. Oropharynx  is clear. Neck is nontender without adenopathy or JVD. Back is nontender and there is no CVA tenderness. Lungs are clear without rales, wheezes, or rhonchi. Chest is nontender. Heart has regular rate and rhythm without murmur. Abdomen is soft, flat, nontender without masses or hepatosplenomegaly and peristalsis is normoactive. Extremities have no cyanosis or edema, full range of motion is present. Skin is warm and dry without rash. Neurologic: Mental status is normal, cranial nerves are intact, there are no motor or sensory deficits.   ED Treatments / Results   Radiology Ct Head Wo Contrast  Result Date: 08/14/2017 CLINICAL DATA:  Fall from bed with head impact on table. EXAM: CT HEAD WITHOUT CONTRAST CT CERVICAL SPINE WITHOUT CONTRAST TECHNIQUE: Multidetector CT imaging of the head and cervical spine was performed following the standard protocol without intravenous contrast. Multiplanar CT image reconstructions of the cervical spine were also generated. COMPARISON:  Head CT 06/16/2017 FINDINGS: CT HEAD FINDINGS Brain: No mass lesion, intraparenchymal hemorrhage or extra-axial collection. No evidence of acute cortical infarct. There is periventricular hypoattenuation compatible with chronic microvascular disease. Vascular: No hyperdense vessel or unexpected calcification. Skull: There is a large frontal scalp laceration. No skull fracture. Sinuses/Orbits: No sinus fluid levels or advanced mucosal thickening. No mastoid effusion. Normal orbits. CT CERVICAL SPINE FINDINGS Alignment: No static subluxation. Facets are aligned. Occipital condyles are normally positioned. Skull base and vertebrae: No acute fracture. Soft tissues and spinal canal: No prevertebral fluid or swelling. No visible canal hematoma. Disc levels: Multilevel uncovertebral and facet hypertrophy. No bony spinal canal stenosis. Upper chest: No pneumothorax, pulmonary nodule or pleural effusion. Other: Normal visualized paraspinal  cervical soft tissues. IMPRESSION: 1. Advanced chronic microvascular disease without acute intracranial abnormality. 2. Large frontal scalp laceration without underlying skull fracture. 3. No acute fracture or static subluxation of the cervical spine. Electronically Signed   By: Deatra Robinson M.D.   On: 08/14/2017 04:44   Ct Cervical Spine Wo Contrast  Result Date: 08/14/2017 CLINICAL DATA:  Fall from bed with head impact on table. EXAM: CT HEAD WITHOUT CONTRAST CT CERVICAL SPINE WITHOUT CONTRAST TECHNIQUE: Multidetector CT imaging of the head and cervical spine was performed following the standard protocol without intravenous contrast. Multiplanar CT image reconstructions of the cervical spine were also generated. COMPARISON:  Head CT 06/16/2017 FINDINGS: CT HEAD FINDINGS Brain: No mass lesion, intraparenchymal hemorrhage or extra-axial collection. No evidence of acute cortical infarct. There is periventricular hypoattenuation compatible with chronic microvascular disease. Vascular: No hyperdense vessel or unexpected calcification. Skull: There is a large frontal scalp laceration. No skull fracture. Sinuses/Orbits: No sinus fluid levels or advanced mucosal thickening. No mastoid effusion. Normal orbits. CT CERVICAL SPINE FINDINGS Alignment: No static subluxation. Facets are aligned. Occipital condyles are normally positioned. Skull base and vertebrae:  No acute fracture. Soft tissues and spinal canal: No prevertebral fluid or swelling. No visible canal hematoma. Disc levels: Multilevel uncovertebral and facet hypertrophy. No bony spinal canal stenosis. Upper chest: No pneumothorax, pulmonary nodule or pleural effusion. Other: Normal visualized paraspinal cervical soft tissues. IMPRESSION: 1. Advanced chronic microvascular disease without acute intracranial abnormality. 2. Large frontal scalp laceration without underlying skull fracture. 3. No acute fracture or static subluxation of the cervical spine.  Electronically Signed   By: Deatra RobinsonKevin  Herman M.D.   On: 08/14/2017 04:44    Procedures .Marland Kitchen.Laceration Repair Date/Time: 08/14/2017 3:22 AM Performed by: Dione BoozeGlick, Kyndra Condron, MD Authorized by: Dione BoozeGlick, Lazariah Savard, MD   Consent:    Consent obtained:  Verbal   Consent given by:  Patient   Risks discussed:  Infection, pain and poor cosmetic result   Alternatives discussed:  No treatment and delayed treatment Anesthesia (see MAR for exact dosages):    Anesthesia method:  None Laceration details:    Location:  Scalp   Scalp location:  Frontal   Length (cm):  4   Depth (mm):  4 Repair type:    Repair type:  Simple Pre-procedure details:    Preparation:  Patient was prepped and draped in usual sterile fashion and imaging obtained to evaluate for foreign bodies Exploration:    Hemostasis achieved with:  Direct pressure   Contaminated: no   Treatment:    Area cleansed with:  Saline   Amount of cleaning:  Standard Skin repair:    Repair method:  Staples   Number of staples:  8 Approximation:    Approximation:  Close Post-procedure details:    Dressing:  Sterile dressing   Patient tolerance of procedure:  Tolerated well, no immediate complications   (including critical care time)  Medications Ordered in ED Medications  Tdap (BOOSTRIX) injection 0.5 mL (not administered)     Initial Impression / Assessment and Plan / ED Course  I have reviewed the triage vital signs and the nursing notes.  Pertinent imaging results that were available during my care of the patient were reviewed by me and considered in my medical decision making (see chart for details).  Fall with scalp laceration.  In order to achieve hemostasis, his laceration was closed promptly with staples.  She is sent for CT of head and cervical spine.  Old records are reviewed, and she has no relevant past visits. Tdap booster is given.  CT of head and cervical spine were unremarkable.  On return from CT, she was noted to have some  ongoing bleeding from her laceration.  5 additional staples were placed over the bleeding area and there was no further bleeding.  Pressure dressing was applied.  She is discharged with instructions to have staples removed in 7-10 days.  Final Clinical Impressions(s) / ED Diagnoses   Final diagnoses:  Fall involving bed as cause of accidental injury in home as place of occurrence, initial encounter  Scalp laceration, initial encounter    ED Discharge Orders    None       Dione BoozeGlick, Sarim Rothman, MD 08/14/17 0505

## 2017-08-14 NOTE — ED Notes (Signed)
Patient transported to CT 

## 2017-08-14 NOTE — ED Notes (Signed)
Pt verbalizes understanding of d/c instructions. Pt taken out to lobby in wheelchair with all belongings.   

## 2017-08-14 NOTE — ED Notes (Signed)
ED Provider at bedside. 

## 2017-08-14 NOTE — ED Triage Notes (Signed)
Pt BIB [facility] for fall. Pt was sleeping in bed, rolled out of bed and struck her head on a night table. Denies LOC or neck pain. Pts son wrapped her head PTA. A+O during triage

## 2017-08-14 NOTE — Discharge Instructions (Signed)
Staples need to be removed in 7-10 days. That can be done here, or at an urgent care center.  You received a tetanus booster today. You will not need another one for ten years.

## 2017-08-15 ENCOUNTER — Emergency Department (HOSPITAL_COMMUNITY): Payer: Medicare Other

## 2017-08-15 ENCOUNTER — Emergency Department (HOSPITAL_COMMUNITY)
Admission: EM | Admit: 2017-08-15 | Discharge: 2017-08-15 | Disposition: A | Discharge status: Home / Self Care | Payer: Medicare Other | Attending: Emergency Medicine | Admitting: Emergency Medicine

## 2017-08-15 ENCOUNTER — Other Ambulatory Visit: Payer: Self-pay

## 2017-08-15 ENCOUNTER — Encounter (HOSPITAL_COMMUNITY): Payer: Self-pay | Admitting: Emergency Medicine

## 2017-08-15 DIAGNOSIS — F329 Major depressive disorder, single episode, unspecified: Secondary | ICD-10-CM | POA: Diagnosis not present

## 2017-08-15 DIAGNOSIS — I2510 Atherosclerotic heart disease of native coronary artery without angina pectoris: Secondary | ICD-10-CM | POA: Insufficient documentation

## 2017-08-15 DIAGNOSIS — Y998 Other external cause status: Secondary | ICD-10-CM | POA: Insufficient documentation

## 2017-08-15 DIAGNOSIS — S060X0A Concussion without loss of consciousness, initial encounter: Secondary | ICD-10-CM | POA: Diagnosis not present

## 2017-08-15 DIAGNOSIS — S0990XA Unspecified injury of head, initial encounter: Secondary | ICD-10-CM | POA: Diagnosis present

## 2017-08-15 DIAGNOSIS — Z7984 Long term (current) use of oral hypoglycemic drugs: Secondary | ICD-10-CM | POA: Insufficient documentation

## 2017-08-15 DIAGNOSIS — Z7902 Long term (current) use of antithrombotics/antiplatelets: Secondary | ICD-10-CM | POA: Diagnosis not present

## 2017-08-15 DIAGNOSIS — Z7982 Long term (current) use of aspirin: Secondary | ICD-10-CM | POA: Insufficient documentation

## 2017-08-15 DIAGNOSIS — I10 Essential (primary) hypertension: Secondary | ICD-10-CM | POA: Diagnosis not present

## 2017-08-15 DIAGNOSIS — Y929 Unspecified place or not applicable: Secondary | ICD-10-CM | POA: Insufficient documentation

## 2017-08-15 DIAGNOSIS — Y9389 Activity, other specified: Secondary | ICD-10-CM | POA: Insufficient documentation

## 2017-08-15 DIAGNOSIS — R4702 Dysphasia: Secondary | ICD-10-CM | POA: Diagnosis not present

## 2017-08-15 DIAGNOSIS — J45909 Unspecified asthma, uncomplicated: Secondary | ICD-10-CM | POA: Insufficient documentation

## 2017-08-15 DIAGNOSIS — S060X0D Concussion without loss of consciousness, subsequent encounter: Secondary | ICD-10-CM

## 2017-08-15 DIAGNOSIS — Z79899 Other long term (current) drug therapy: Secondary | ICD-10-CM | POA: Insufficient documentation

## 2017-08-15 DIAGNOSIS — E119 Type 2 diabetes mellitus without complications: Secondary | ICD-10-CM | POA: Insufficient documentation

## 2017-08-15 DIAGNOSIS — G4751 Confusional arousals: Secondary | ICD-10-CM | POA: Diagnosis not present

## 2017-08-15 DIAGNOSIS — R410 Disorientation, unspecified: Secondary | ICD-10-CM | POA: Diagnosis not present

## 2017-08-15 DIAGNOSIS — W0110XA Fall on same level from slipping, tripping and stumbling with subsequent striking against unspecified object, initial encounter: Secondary | ICD-10-CM | POA: Diagnosis not present

## 2017-08-15 LAB — DIFFERENTIAL
BASOS PCT: 0 %
Basophils Absolute: 0 10*3/uL (ref 0.0–0.1)
Eosinophils Absolute: 0 10*3/uL (ref 0.0–0.7)
Eosinophils Relative: 0 %
LYMPHS ABS: 1.5 10*3/uL (ref 0.7–4.0)
Lymphocytes Relative: 13 %
MONO ABS: 0.9 10*3/uL (ref 0.1–1.0)
MONOS PCT: 8 %
NEUTROS ABS: 9 10*3/uL — AB (ref 1.7–7.7)
Neutrophils Relative %: 79 %

## 2017-08-15 LAB — COMPREHENSIVE METABOLIC PANEL
ALK PHOS: 56 U/L (ref 38–126)
ALT: 13 U/L — AB (ref 14–54)
AST: 27 U/L (ref 15–41)
Albumin: 3.7 g/dL (ref 3.5–5.0)
Anion gap: 10 (ref 5–15)
BILIRUBIN TOTAL: 1 mg/dL (ref 0.3–1.2)
BUN: 16 mg/dL (ref 6–20)
CALCIUM: 8.9 mg/dL (ref 8.9–10.3)
CHLORIDE: 100 mmol/L — AB (ref 101–111)
CO2: 24 mmol/L (ref 22–32)
CREATININE: 0.84 mg/dL (ref 0.44–1.00)
GFR, EST NON AFRICAN AMERICAN: 59 mL/min — AB (ref >60)
Glucose, Bld: 180 mg/dL — ABNORMAL HIGH (ref 65–99)
Potassium: 4 mmol/L (ref 3.5–5.1)
Sodium: 134 mmol/L — ABNORMAL LOW (ref 135–145)
TOTAL PROTEIN: 7 g/dL (ref 6.5–8.1)

## 2017-08-15 LAB — URINALYSIS, ROUTINE W REFLEX MICROSCOPIC
Bilirubin Urine: NEGATIVE
GLUCOSE, UA: NEGATIVE mg/dL
Ketones, ur: 5 mg/dL — AB
Nitrite: NEGATIVE
PH: 7 (ref 5.0–8.0)
PROTEIN: NEGATIVE mg/dL
SPECIFIC GRAVITY, URINE: 1.009 (ref 1.005–1.030)

## 2017-08-15 LAB — CBC
HEMATOCRIT: 31.5 % — AB (ref 36.0–46.0)
Hemoglobin: 10.4 g/dL — ABNORMAL LOW (ref 12.0–15.0)
MCH: 27.7 pg (ref 26.0–34.0)
MCHC: 33 g/dL (ref 30.0–36.0)
MCV: 83.8 fL (ref 78.0–100.0)
Platelets: 253 10*3/uL (ref 150–400)
RBC: 3.76 MIL/uL — ABNORMAL LOW (ref 3.87–5.11)
RDW: 15 % (ref 11.5–15.5)
WBC: 11.4 10*3/uL — AB (ref 4.0–10.5)

## 2017-08-15 LAB — I-STAT CHEM 8, ED
BUN: 17 mg/dL (ref 6–20)
CALCIUM ION: 1.14 mmol/L — AB (ref 1.15–1.40)
CHLORIDE: 98 mmol/L — AB (ref 101–111)
CREATININE: 0.7 mg/dL (ref 0.44–1.00)
GLUCOSE: 176 mg/dL — AB (ref 65–99)
HCT: 35 % — ABNORMAL LOW (ref 36.0–46.0)
Hemoglobin: 11.9 g/dL — ABNORMAL LOW (ref 12.0–15.0)
Potassium: 4 mmol/L (ref 3.5–5.1)
Sodium: 136 mmol/L (ref 135–145)
TCO2: 24 mmol/L (ref 22–32)

## 2017-08-15 LAB — PROTIME-INR
INR: 1.04
Prothrombin Time: 13.5 seconds (ref 11.4–15.2)

## 2017-08-15 LAB — I-STAT TROPONIN, ED: TROPONIN I, POC: 0 ng/mL (ref 0.00–0.08)

## 2017-08-15 LAB — CBG MONITORING, ED: Glucose-Capillary: 166 mg/dL — ABNORMAL HIGH (ref 65–99)

## 2017-08-15 LAB — APTT: aPTT: 32 seconds (ref 24–36)

## 2017-08-15 MED ORDER — BISACODYL 10 MG RE SUPP: 10.0000 mg | Freq: Once | RECTAL | Status: DC

## 2017-08-15 MED ORDER — SODIUM CHLORIDE 0.9 % IV SOLN
INTRAVENOUS | Status: DC
  Administered 2017-08-15: 17:00:00 via INTRAVENOUS

## 2017-08-15 NOTE — ED Provider Notes (Signed)
[facility] EMERGENCY DEPARTMENT Provider Note   CSN: [id number] Arrival date & time: 08/15/17  1207   An emergency department physician performed an initial assessment on this suspected stroke patient at 1228.  History   Chief Complaint Chief Complaint  Patient presents with  . Altered Mental Status    HPI Deeana D Littleton is a 83 y.o. female.  The history is provided by the patient, the spouse and a relative. No language interpreter was used.  Altered Mental Status     Aniyla D Colasurdo is a 83 y.o. female who presents to the Emergency Department complaining of AMS.  She presents to the emergency department for evaluation of altered mental status.  Level 5 caveat due to altered mental status.  History is provided by the patient and her husband.  She had a fall at 2 AM yesterday morning and was seen in the emergency department and was placed.  She has been having some mild confusion since that time.  Today she was talking to her sister over the phone and could not understand sister was saying.  She attempted to talk to her son but she also cannot understand anything her son was saying.  She was able to verbalize without difficulties.  She reports feeling funny in her head.  No fevers, chest pain, non-pain, nausea, vomiting. Past Medical History:  Diagnosis Date  . Diabetes (HCC)   . Hearing loss   . Thalamic infarction [facility])     Patient Active Problem List   Diagnosis Date Noted  . Mild cognitive impairment with memory loss 06/08/2014  . SORE THROAT 04/10/2010  . Dyspnea 10/25/2009  . COUGH 10/25/2009  . HYPERLIPIDEMIA 10/24/2009  . DEPRESSION 10/24/2009  . HYPERTENSION 10/24/2009  . CAD 10/24/2009  . ALLERGIC RHINITIS, SEASONAL 10/24/2009  . Asthma 10/24/2009  . OSTEOPENIA 10/24/2009    Past Surgical History:  Procedure Laterality Date  . fractured rib      OB History    No data available       Home Medications    Prior to Admission  medications   Medication Sig Start Date End Date Taking? Authorizing Provider  amLODipine-olmesartan (AZOR) 5-40 MG tablet Take 1 tablet by mouth daily.  06/13/16  Yes [provider]  aspirin 81 MG chewable tablet Chew 81 mg by mouth daily.   Yes [provider]  cholecalciferol (VITAMIN D) 400 units TABS tablet Take 400 Units by mouth daily.   Yes [provider]  clopidogrel (PLAVIX) 75 MG tablet TAKE 1 TABLET BY MOUTH DAILY 08/28/16  Yes Micki Riley, MD  donepezil (ARICEPT) 5 MG tablet Take 5 mg by mouth daily.  06/13/16  Yes [provider]  Fluticasone Propionate, Inhal, (FLOVENT DISKUS) 250 MCG/BLIST AEPB Inhale 1 puff into the lungs 2 (two) times daily.   Yes [provider]  glimepiride (AMARYL) 1 MG tablet Take 1 mg by mouth daily.  02/02/15  Yes [provider]  loratadine (CLARITIN) 10 MG tablet Take 10 mg by mouth at bedtime.   Yes [provider]  metoprolol succinate (TOPROL-XL) 25 MG 24 hr tablet Take 25 mg by mouth daily.  05/20/14  Yes [provider]  Multiple Vitamin (MULTIVITAMIN) tablet Take 1 tablet by mouth daily.   Yes [provider]  MYRBETRIQ 25 MG TB24 tablet Take 25 mg by mouth daily.  06/13/16  Yes [provider]  Omega-3 Fatty Acids (FISH OIL) 1000 MG CAPS Take 1,000 mg by  mouth daily.   Yes [provider]  PROAIR HFA 108 (90 BASE) MCG/ACT inhaler INHALE 2 PUFFS EVERY 4 HOURS AS NEEDED FOR WHEEZING 02/21/15  Yes [provider]  rosuvastatin (CRESTOR) 5 MG tablet Take 5 mg by mouth daily.   Yes [provider]  Spacer/Aero-Holding Chambers (AEROCHAMBER MV) inhaler Use as instructed 08/15/16  Yes Parrett, Tammy S, NP  clopidogrel (PLAVIX) 75 MG tablet TAKE 1 TABLET BY MOUTH DAILY Patient not taking: Reported on 08/15/2017 09/29/16   Micki Riley, MD  mometasone-formoterol (DULERA) 100-5 MCG/ACT AERO Inhale 2 puffs into the lungs 2 (two) times  daily. Patient not taking: Reported on 08/15/2017 08/14/16   Parrett, Virgel Bouquet, NP    Family History Family History  Problem Relation Age of Onset  . Cancer Mother   . Heart disease Sister   . Diabetes Unknown   . Stroke Unknown     Social History Social History   Tobacco Use  . Smoking status: Never Smoker  . Smokeless tobacco: Never Used  Substance Use Topics  . Alcohol use: No  . Drug use: No     Allergies   Sulfonamide derivatives   Review of Systems Review of Systems  All other systems reviewed and are negative.    Physical Exam Updated Vital Signs BP (!) 143/57   Pulse 77   Temp 97.7 F (36.5 C)   Resp 16   SpO2 100%   Physical Exam  Constitutional: She is oriented to person, place, and time. She appears well-developed and well-nourished.  HENT:  Head: Normocephalic.  Staples on the scalp  Cardiovascular: Normal rate and regular rhythm.  No murmur heard. Pulmonary/Chest: Effort normal and breath sounds normal. No respiratory distress.  Abdominal: Soft. There is no tenderness. There is no rebound and no guarding.  Musculoskeletal: She exhibits no edema or tenderness.  Neurological: She is alert and oriented to person, place, and time.  Mild confusion.  5/5 strength in all four extremities.  Repetitive questioning at times.  No pronator drift.  No facial asymmetry.    Skin: Skin is warm and dry.  Psychiatric: She has a normal mood and affect. Her behavior is normal.  Nursing note and vitals reviewed.    ED Treatments / Results  Labs (all labs ordered are listed, but only abnormal results are displayed) Labs Reviewed  CBC - Abnormal; Notable for the following components:      Result Value   WBC 11.4 (*)    RBC 3.76 (*)    Hemoglobin 10.4 (*)    HCT 31.5 (*)    All other components within normal limits  DIFFERENTIAL - Abnormal; Notable for the following components:   Neutro Abs 9.0 (*)    All other components within normal limits   COMPREHENSIVE METABOLIC PANEL - Abnormal; Notable for the following components:   Sodium 134 (*)    Chloride 100 (*)    Glucose, Bld 180 (*)    ALT 13 (*)    GFR calc non Af Amer 59 (*)    All other components within normal limits  CBG MONITORING, ED - Abnormal; Notable for the following components:   Glucose-Capillary 166 (*)    All other components within normal limits  I-STAT CHEM 8, ED - Abnormal; Notable for the following components:   Chloride 98 (*)    Glucose, Bld 176 (*)    Calcium, Ion 1.14 (*)    Hemoglobin 11.9 (*)    HCT 35.0 (*)  All other components within normal limits  PROTIME-INR  APTT  I-STAT TROPONIN, ED    EKG  EKG Interpretation None       Radiology Ct Head Wo Contrast  Result Date: 08/14/2017 CLINICAL DATA:  Fall from bed with head impact on table. EXAM: CT HEAD WITHOUT CONTRAST CT CERVICAL SPINE WITHOUT CONTRAST TECHNIQUE: Multidetector CT imaging of the head and cervical spine was performed following the standard protocol without intravenous contrast. Multiplanar CT image reconstructions of the cervical spine were also generated. COMPARISON:  Head CT 06/16/2017 FINDINGS: CT HEAD FINDINGS Brain: No mass lesion, intraparenchymal hemorrhage or extra-axial collection. No evidence of acute cortical infarct. There is periventricular hypoattenuation compatible with chronic microvascular disease. Vascular: No hyperdense vessel or unexpected calcification. Skull: There is a large frontal scalp laceration. No skull fracture. Sinuses/Orbits: No sinus fluid levels or advanced mucosal thickening. No mastoid effusion. Normal orbits. CT CERVICAL SPINE FINDINGS Alignment: No static subluxation. Facets are aligned. Occipital condyles are normally positioned. Skull base and vertebrae: No acute fracture. Soft tissues and spinal canal: No prevertebral fluid or swelling. No visible canal hematoma. Disc levels: Multilevel uncovertebral and facet hypertrophy. No bony spinal  canal stenosis. Upper chest: No pneumothorax, pulmonary nodule or pleural effusion. Other: Normal visualized paraspinal cervical soft tissues. IMPRESSION: 1. Advanced chronic microvascular disease without acute intracranial abnormality. 2. Large frontal scalp laceration without underlying skull fracture. 3. No acute fracture or static subluxation of the cervical spine. Electronically Signed   By: Deatra RobinsonKevin  Herman M.D.   On: 08/14/2017 04:44   Ct Cervical Spine Wo Contrast  Result Date: 08/14/2017 CLINICAL DATA:  Fall from bed with head impact on table. EXAM: CT HEAD WITHOUT CONTRAST CT CERVICAL SPINE WITHOUT CONTRAST TECHNIQUE: Multidetector CT imaging of the head and cervical spine was performed following the standard protocol without intravenous contrast. Multiplanar CT image reconstructions of the cervical spine were also generated. COMPARISON:  Head CT 06/16/2017 FINDINGS: CT HEAD FINDINGS Brain: No mass lesion, intraparenchymal hemorrhage or extra-axial collection. No evidence of acute cortical infarct. There is periventricular hypoattenuation compatible with chronic microvascular disease. Vascular: No hyperdense vessel or unexpected calcification. Skull: There is a large frontal scalp laceration. No skull fracture. Sinuses/Orbits: No sinus fluid levels or advanced mucosal thickening. No mastoid effusion. Normal orbits. CT CERVICAL SPINE FINDINGS Alignment: No static subluxation. Facets are aligned. Occipital condyles are normally positioned. Skull base and vertebrae: No acute fracture. Soft tissues and spinal canal: No prevertebral fluid or swelling. No visible canal hematoma. Disc levels: Multilevel uncovertebral and facet hypertrophy. No bony spinal canal stenosis. Upper chest: No pneumothorax, pulmonary nodule or pleural effusion. Other: Normal visualized paraspinal cervical soft tissues. IMPRESSION: 1. Advanced chronic microvascular disease without acute intracranial abnormality. 2. Large frontal scalp  laceration without underlying skull fracture. 3. No acute fracture or static subluxation of the cervical spine. Electronically Signed   By: Deatra RobinsonKevin  Herman M.D.   On: 08/14/2017 04:44   Ct Head Code Stroke Wo Contrast  Result Date: 08/15/2017 CLINICAL DATA:  Code stroke. Generalized weakness and confusion. Fall from bed. EXAM: CT HEAD WITHOUT CONTRAST TECHNIQUE: Contiguous axial images were obtained from the base of the skull through the vertex without intravenous contrast. COMPARISON:  Head CT from yesterday FINDINGS: Brain: No evidence of acute infarction, hemorrhage, hydrocephalus, extra-axial collection or mass lesion/mass effect. Confluent low-density throughout the cerebral white matter consistent with chronic small vessel ischemia. Patchy ischemic injury in the bilateral thalamus. Remote perforator infarct affecting right basal ganglia. Vascular: Atherosclerotic calcification.  No  hyperdense vessel. Skull: Decreased swelling around frontal scalp laceration. No acute osseous finding. Sinuses/Orbits: Bilateral cataract resection.  No acute finding Other: These results were communicated to Dr. Otelia LimesLindzen at 12:41 pmon 12/22/2018by text page via the [facility] messaging system. ASPECTS [facility]([place] Stroke Program Early CT Score) Not scored with this history. IMPRESSION: 1. No acute finding or change from yesterday. 2. Extensive chronic small vessel ischemia. Electronically Signed   By: Marnee SpringJonathon  Watts M.D.   On: 08/15/2017 12:44    Procedures Procedures (including critical care time)  Medications Ordered in ED Medications  0.9 %  sodium chloride infusion ( Intravenous New Bag/Given 08/15/17 1630)  bisacodyl (DULCOLAX) suppository 10 mg (not administered)     Initial Impression / Assessment and Plan / ED Course  I have reviewed the triage vital signs and the nursing notes.  Pertinent labs & imaging results that were available during my care of the patient were reviewed by me and considered in my medical  decision making (see chart for details).     Patient here for confusion and difficulty understanding conversations earlier today.  She did have a head injury yesterday.  Code stroke was activated on ED arrival and she was evaluated by neurology who discontinued code stroke.  MRI and MRA obtained and are negative for acute stroke.  Presentation is more consistent with concussion and some element of possible mild dementia.  Plan to discharge home with outpatient neurology follow-up.  Plan discussed with patient and her son.  Son is Chrys RacerSid Feild [phone]  Final Clinical Impressions(s) / ED Diagnoses   Final diagnoses:  Concussion without loss of consciousness, initial encounter  Confusion    ED Discharge Orders    None       Tilden Fossaees, Ersel Enslin, MD 08/15/17 1821

## 2017-08-15 NOTE — Consult Note (Signed)
NEURO HOSPITALIST CONSULT NOTE   Requesting physician: Dr. Madilyn Hook  Reason for Consult: Impaired language comprehension  History obtained from:  Patient, Family and Chart     HPI:                                                                                                                                          [name] is an 83 y.o. female who re-presents to the ED with an episode of impaired language comprehension after being discharged yesterday following a fall at home with a scalp laceration which required suturing. This morning she was speaking on the telephone with her sister when she suddenly became unable to understand what her sister was saying. She handed the phone to her son, who noted the sister's speech to be normal. When the son spoke to the patient, she could not understand what he was saying either. When she spoke to her son, partly to tell him that she could not understand others' speech, her speech was completely normal and intelligible without word substitutions, lack of fluency or slurring, and she was able to describe her symptoms to him. Of note, she is hard of hearing at baseline and had a bandage partly overhanging her left ear at the time the episode occurred. Her son endorses a prior episode of confusion in the past. She has a history of thalamic infarction and sees Dr. Pearlean Brownie in follow up for this. She takes Plavix as well as Aricept at home.  Past Medical History:  Diagnosis Date  . Diabetes (HCC)   . Hearing loss   . Thalamic infarction [facility])     Past Surgical History:  Procedure Laterality Date  . fractured rib      Family History  Problem Relation Age of Onset  . Cancer Mother   . Heart disease Sister   . Diabetes Unknown   . Stroke Unknown     Social History:  reports that  has never smoked. she has never used smokeless tobacco. She reports that she does not drink alcohol or use drugs.  Allergies  Allergen Reactions  .  Sulfonamide Derivatives     HOME MEDICATIONS:  ROS:                                                                                                                                       Positive for constipation.   Blood pressure (!) 153/64, pulse 72, resp. rate (!) 21, SpO2 100 %.  General Examination:                                                                                                      HEENT-  Stapled laceration along anterior vertex scalp at the midline.    Lungs- Respirations unlabored Extremities- Warm and well perfused  Neurological Examination Mental Status: Awake and alert. Fully oriented except to year. Speech is fluent without errors of grammar or syntax. Able to follow all basic motor commands and answer questions pertaining to her symptoms but cannot recall the fall she had yesterday in detail, only remembering that she had a fall. She has difficulty performing a 2-step directional command. Able to name thumb, pinky, index finger and pen. No dysarthria.  Cranial Nerves: II: Visual fields grossly normal intact bilaterally. No extinction to DSS.   III,IV, VI: EOMI with saccadic visual pursuits noted. Pupils are reactive; left pupil is irregular. No ptosis.    V,VII: No facial droop. Temp sensation normal bilaterally. VIII: HOH IX,X: Palate rises symmetrically XI: Symmetric XII: Midline tongue extension Motor: Right : Upper extremity   5/5    Left:     Upper extremity   5/5  Lower extremity   5/5     Lower extremity   5/5 Decreased muscle bulk to upper extremities, consistent with advanced age. Normal tone throughout. Sensory: Temp and light touch intact proximally all 4 extremities. Right sided extinction to DSS arm and leg.  Deep Tendon Reflexes: 2+ and symmetric throughout. Toes downgoing bilaterally.  Cerebellar: No ataxia with FNF  bilaterally.  Gait: Deferred  Lab Results: Basic Metabolic Panel: Recent Labs  Lab 08/15/17 1241 08/15/17 1300  NA 134* 136  K 4.0 4.0  CL 100* 98*  CO2 24  --   GLUCOSE 180* 176*  BUN 16 17  CREATININE 0.84 0.70  CALCIUM 8.9  --     Liver Function Tests: Recent Labs  Lab 08/15/17 1241  AST 27  ALT 13*  ALKPHOS 56  BILITOT 1.0  PROT 7.0  ALBUMIN 3.7   No results for input(s): LIPASE, AMYLASE in the last 168 hours. No results for input(s): AMMONIA in the last 168 hours.  CBC: Recent Labs  Lab 08/15/17 1241 08/15/17 1300  WBC 11.4*  --   NEUTROABS 9.0*  --   HGB 10.4* 11.9*  HCT 31.5* 35.0*  MCV 83.8  --   PLT 253  --     Cardiac Enzymes: No results for input(s): CKTOTAL, CKMB, CKMBINDEX, TROPONINI in the last 168 hours.  Lipid Panel: No results for input(s): CHOL, TRIG, HDL, CHOLHDL, VLDL, LDLCALC in the last 168 hours.  CBG: Recent Labs  Lab 08/15/17 1254  GLUCAP 166*    Microbiology: No results found for this or any previous visit.  Coagulation Studies: Recent Labs    08/15/17 1241  LABPROT 13.5  INR 1.04    Imaging: Ct Head Wo Contrast  Result Date: 08/14/2017 CLINICAL DATA:  Fall from bed with head impact on table. EXAM: CT HEAD WITHOUT CONTRAST CT CERVICAL SPINE WITHOUT CONTRAST TECHNIQUE: Multidetector CT imaging of the head and cervical spine was performed following the standard protocol without intravenous contrast. Multiplanar CT image reconstructions of the cervical spine were also generated. COMPARISON:  Head CT 06/16/2017 FINDINGS: CT HEAD FINDINGS Brain: No mass lesion, intraparenchymal hemorrhage or extra-axial collection. No evidence of acute cortical infarct. There is periventricular hypoattenuation compatible with chronic microvascular disease. Vascular: No hyperdense vessel or unexpected calcification. Skull: There is a large frontal scalp laceration. No skull fracture. Sinuses/Orbits: No sinus fluid levels or advanced mucosal  thickening. No mastoid effusion. Normal orbits. CT CERVICAL SPINE FINDINGS Alignment: No static subluxation. Facets are aligned. Occipital condyles are normally positioned. Skull base and vertebrae: No acute fracture. Soft tissues and spinal canal: No prevertebral fluid or swelling. No visible canal hematoma. Disc levels: Multilevel uncovertebral and facet hypertrophy. No bony spinal canal stenosis. Upper chest: No pneumothorax, pulmonary nodule or pleural effusion. Other: Normal visualized paraspinal cervical soft tissues. IMPRESSION: 1. Advanced chronic microvascular disease without acute intracranial abnormality. 2. Large frontal scalp laceration without underlying skull fracture. 3. No acute fracture or static subluxation of the cervical spine. Electronically Signed   By: Deatra RobinsonKevin  Herman M.D.   On: 08/14/2017 04:44   Ct Cervical Spine Wo Contrast  Result Date: 08/14/2017 CLINICAL DATA:  Fall from bed with head impact on table. EXAM: CT HEAD WITHOUT CONTRAST CT CERVICAL SPINE WITHOUT CONTRAST TECHNIQUE: Multidetector CT imaging of the head and cervical spine was performed following the standard protocol without intravenous contrast. Multiplanar CT image reconstructions of the cervical spine were also generated. COMPARISON:  Head CT 06/16/2017 FINDINGS: CT HEAD FINDINGS Brain: No mass lesion, intraparenchymal hemorrhage or extra-axial collection. No evidence of acute cortical infarct. There is periventricular hypoattenuation compatible with chronic microvascular disease. Vascular: No hyperdense vessel or unexpected calcification. Skull: There is a large frontal scalp laceration. No skull fracture. Sinuses/Orbits: No sinus fluid levels or advanced mucosal thickening. No mastoid effusion. Normal orbits. CT CERVICAL SPINE FINDINGS Alignment: No static subluxation. Facets are aligned. Occipital condyles are normally positioned. Skull base and vertebrae: No acute fracture. Soft tissues and spinal canal: No  prevertebral fluid or swelling. No visible canal hematoma. Disc levels: Multilevel uncovertebral and facet hypertrophy. No bony spinal canal stenosis. Upper chest: No pneumothorax, pulmonary nodule or pleural effusion. Other: Normal visualized paraspinal cervical soft tissues. IMPRESSION: 1. Advanced chronic microvascular disease without acute intracranial abnormality. 2. Large frontal scalp laceration without underlying skull fracture. 3. No acute fracture or static subluxation of the cervical spine. Electronically Signed   By: Deatra RobinsonKevin  Herman M.D.   On: 08/14/2017 04:44   Ct Head Code Stroke Wo Contrast  Result Date: 08/15/2017 CLINICAL DATA:  Code stroke. Generalized weakness and confusion. Fall from bed. EXAM: CT HEAD WITHOUT CONTRAST TECHNIQUE: Contiguous axial images were obtained from the base of the skull through the vertex without intravenous contrast. COMPARISON:  Head CT from yesterday FINDINGS: Brain: No evidence of acute infarction, hemorrhage, hydrocephalus, extra-axial collection or mass lesion/mass effect. Confluent low-density throughout the cerebral white matter consistent with chronic small vessel ischemia. Patchy ischemic injury in the bilateral thalamus. Remote perforator infarct affecting right basal ganglia. Vascular: Atherosclerotic calcification.  No hyperdense vessel. Skull: Decreased swelling around frontal scalp laceration. No acute osseous finding. Sinuses/Orbits: Bilateral cataract resection.  No acute finding Other: These results were communicated to Dr. Otelia LimesLindzen at 12:41 pmon 12/22/2018by text page via the [facility] messaging system. ASPECTS [facility]([place] St[facility] Early CT Score) Not scored with this history. IMPRESSION: 1. No acute finding or change from yesterday. 2. Extensive chronic small vessel ischemia. Electronically Signed   By: Marnee SpringJonathon  Watts M.D.   On: 08/15/2017 12:44    Assessment: 83 year old female re-presenting with new onset of intermittently impaired language  comprehension after a fall with scalp laceration yesterday 1. CT head shows no acute finding or change from yesterday. Extensive chronic small vessel ischemia and large frontal scalp laceration noted.   2. Examination nonlateralizing. There is mild cognitive/comprehension deficit but no receptive or expressive aphasia on my examination. Her symptoms at home occurred simultaneously with intact fluency and normal content of her own speech. Symptoms likely to be a combination of pre-existing hearing loss and post-concussive confusion +/- a component of mild or incipient dementia.   Recommendations: 1. MRI brain without contrast 2. MRA head 3. IV NS at 75 cc/hr 4. Frequent neuro checks 5. Continue Plavix   Electronically signed: Dr. Caryl PinaEric Wing Schoch 08/15/2017, 2:22 PM

## 2017-08-15 NOTE — ED Triage Notes (Signed)
Pt was brought in by her son with complaint of acute confusion onset approximately 1 hour ago. She had a fall with head injury yesterday and cannot remember what happened. Pt oriented to person, time, and place, disoriented to situation.

## 2017-08-15 NOTE — ED Notes (Signed)
MRI notified with pts sons phone number and verbalized MRI will call [phone] when they are able to burn MRI onto disc

## 2017-08-15 NOTE — ED Notes (Signed)
MD at bedside discussing MRI results

## 2019-09-27 ENCOUNTER — Encounter (HOSPITAL_COMMUNITY): Payer: Self-pay

## 2019-09-27 ENCOUNTER — Other Ambulatory Visit: Payer: Self-pay

## 2019-09-27 ENCOUNTER — Emergency Department (HOSPITAL_COMMUNITY): Payer: Medicare Other

## 2019-09-27 ENCOUNTER — Inpatient Hospital Stay (HOSPITAL_COMMUNITY)
Admission: EM | Admit: 2019-09-27 | Discharge: 2019-10-05 | DRG: 871 | Disposition: A | Discharge status: Nursing Home (Includes ICF) | Payer: Medicare Other | Attending: Internal Medicine | Admitting: Internal Medicine

## 2019-09-27 DIAGNOSIS — J9601 Acute respiratory failure with hypoxia: Secondary | ICD-10-CM | POA: Diagnosis present

## 2019-09-27 DIAGNOSIS — A419 Sepsis, unspecified organism: Secondary | ICD-10-CM | POA: Diagnosis present

## 2019-09-27 DIAGNOSIS — R509 Fever, unspecified: Secondary | ICD-10-CM

## 2019-09-27 DIAGNOSIS — Z79899 Other long term (current) drug therapy: Secondary | ICD-10-CM

## 2019-09-27 DIAGNOSIS — K228 Other specified diseases of esophagus: Secondary | ICD-10-CM

## 2019-09-27 DIAGNOSIS — R400 Somnolence: Secondary | ICD-10-CM | POA: Diagnosis not present

## 2019-09-27 DIAGNOSIS — Z882 Allergy status to sulfonamides status: Secondary | ICD-10-CM

## 2019-09-27 DIAGNOSIS — J4530 Mild persistent asthma, uncomplicated: Secondary | ICD-10-CM | POA: Diagnosis present

## 2019-09-27 DIAGNOSIS — R531 Weakness: Secondary | ICD-10-CM

## 2019-09-27 DIAGNOSIS — Z7982 Long term (current) use of aspirin: Secondary | ICD-10-CM

## 2019-09-27 DIAGNOSIS — Z20822 Contact with and (suspected) exposure to covid-19: Secondary | ICD-10-CM | POA: Diagnosis present

## 2019-09-27 DIAGNOSIS — F419 Anxiety disorder, unspecified: Secondary | ICD-10-CM | POA: Diagnosis present

## 2019-09-27 DIAGNOSIS — E785 Hyperlipidemia, unspecified: Secondary | ICD-10-CM | POA: Diagnosis present

## 2019-09-27 DIAGNOSIS — I110 Hypertensive heart disease with heart failure: Secondary | ICD-10-CM | POA: Diagnosis present

## 2019-09-27 DIAGNOSIS — R627 Adult failure to thrive: Secondary | ICD-10-CM | POA: Diagnosis present

## 2019-09-27 DIAGNOSIS — Z7902 Long term (current) use of antithrombotics/antiplatelets: Secondary | ICD-10-CM

## 2019-09-27 DIAGNOSIS — I248 Other forms of acute ischemic heart disease: Secondary | ICD-10-CM | POA: Diagnosis present

## 2019-09-27 DIAGNOSIS — J918 Pleural effusion in other conditions classified elsewhere: Secondary | ICD-10-CM | POA: Diagnosis present

## 2019-09-27 DIAGNOSIS — Z8673 Personal history of transient ischemic attack (TIA), and cerebral infarction without residual deficits: Secondary | ICD-10-CM

## 2019-09-27 DIAGNOSIS — E875 Hyperkalemia: Secondary | ICD-10-CM | POA: Diagnosis present

## 2019-09-27 DIAGNOSIS — R0902 Hypoxemia: Secondary | ICD-10-CM

## 2019-09-27 DIAGNOSIS — R05 Cough: Secondary | ICD-10-CM | POA: Diagnosis present

## 2019-09-27 DIAGNOSIS — J302 Other seasonal allergic rhinitis: Secondary | ICD-10-CM | POA: Diagnosis present

## 2019-09-27 DIAGNOSIS — E1165 Type 2 diabetes mellitus with hyperglycemia: Secondary | ICD-10-CM | POA: Diagnosis present

## 2019-09-27 DIAGNOSIS — Z823 Family history of stroke: Secondary | ICD-10-CM

## 2019-09-27 DIAGNOSIS — J939 Pneumothorax, unspecified: Secondary | ICD-10-CM

## 2019-09-27 DIAGNOSIS — H9193 Unspecified hearing loss, bilateral: Secondary | ICD-10-CM | POA: Diagnosis present

## 2019-09-27 DIAGNOSIS — I10 Essential (primary) hypertension: Secondary | ICD-10-CM | POA: Diagnosis not present

## 2019-09-27 DIAGNOSIS — E876 Hypokalemia: Secondary | ICD-10-CM | POA: Diagnosis not present

## 2019-09-27 DIAGNOSIS — F0390 Unspecified dementia without behavioral disturbance: Secondary | ICD-10-CM | POA: Diagnosis present

## 2019-09-27 DIAGNOSIS — J90 Pleural effusion, not elsewhere classified: Secondary | ICD-10-CM | POA: Diagnosis not present

## 2019-09-27 DIAGNOSIS — E440 Moderate protein-calorie malnutrition: Secondary | ICD-10-CM | POA: Diagnosis present

## 2019-09-27 DIAGNOSIS — R0602 Shortness of breath: Secondary | ICD-10-CM

## 2019-09-27 DIAGNOSIS — Z7951 Long term (current) use of inhaled steroids: Secondary | ICD-10-CM

## 2019-09-27 DIAGNOSIS — I2510 Atherosclerotic heart disease of native coronary artery without angina pectoris: Secondary | ICD-10-CM | POA: Diagnosis present

## 2019-09-27 DIAGNOSIS — F329 Major depressive disorder, single episode, unspecified: Secondary | ICD-10-CM | POA: Diagnosis present

## 2019-09-27 DIAGNOSIS — R32 Unspecified urinary incontinence: Secondary | ICD-10-CM | POA: Diagnosis present

## 2019-09-27 DIAGNOSIS — R6520 Severe sepsis without septic shock: Secondary | ICD-10-CM | POA: Diagnosis present

## 2019-09-27 DIAGNOSIS — K2289 Other specified disease of esophagus: Secondary | ICD-10-CM

## 2019-09-27 DIAGNOSIS — Z833 Family history of diabetes mellitus: Secondary | ICD-10-CM

## 2019-09-27 DIAGNOSIS — Z8249 Family history of ischemic heart disease and other diseases of the circulatory system: Secondary | ICD-10-CM

## 2019-09-27 DIAGNOSIS — E860 Dehydration: Secondary | ICD-10-CM | POA: Diagnosis present

## 2019-09-27 DIAGNOSIS — I5033 Acute on chronic diastolic (congestive) heart failure: Secondary | ICD-10-CM | POA: Diagnosis present

## 2019-09-27 DIAGNOSIS — J189 Pneumonia, unspecified organism: Secondary | ICD-10-CM | POA: Diagnosis present

## 2019-09-27 DIAGNOSIS — R059 Cough, unspecified: Secondary | ICD-10-CM | POA: Diagnosis present

## 2019-09-27 DIAGNOSIS — Z6821 Body mass index (BMI) 21.0-21.9, adult: Secondary | ICD-10-CM

## 2019-09-27 DIAGNOSIS — R1310 Dysphagia, unspecified: Secondary | ICD-10-CM | POA: Diagnosis present

## 2019-09-27 DIAGNOSIS — M7989 Other specified soft tissue disorders: Secondary | ICD-10-CM | POA: Diagnosis not present

## 2019-09-27 LAB — URINALYSIS, ROUTINE W REFLEX MICROSCOPIC
Bacteria, UA: NONE SEEN
Bilirubin Urine: NEGATIVE
Glucose, UA: >=500 mg/dL — AB
Hgb urine dipstick: NEGATIVE
Ketones, ur: 5 mg/dL — AB
Leukocytes,Ua: NEGATIVE
Nitrite: NEGATIVE
Protein, ur: 30 mg/dL — AB
Specific Gravity, Urine: 1.017 (ref 1.005–1.030)
pH: 6 (ref 5.0–8.0)

## 2019-09-27 LAB — CBC WITH DIFFERENTIAL/PLATELET
Abs Immature Granulocytes: 0 10*3/uL (ref 0.00–0.07)
Band Neutrophils: 8 %
Basophils Absolute: 0 10*3/uL (ref 0.0–0.1)
Basophils Relative: 0 %
Eosinophils Absolute: 0 10*3/uL (ref 0.0–0.5)
Eosinophils Relative: 0 %
HCT: 40.7 % (ref 36.0–46.0)
Hemoglobin: 13.3 g/dL (ref 12.0–15.0)
Lymphocytes Relative: 5 %
Lymphs Abs: 0.7 10*3/uL (ref 0.7–4.0)
MCH: 29.4 pg (ref 26.0–34.0)
MCHC: 32.7 g/dL (ref 30.0–36.0)
MCV: 90 fL (ref 80.0–100.0)
Monocytes Absolute: 0.6 10*3/uL (ref 0.1–1.0)
Monocytes Relative: 4 %
Neutro Abs: 13.4 10*3/uL — ABNORMAL HIGH (ref 1.7–7.7)
Neutrophils Relative %: 83 %
Platelets: 277 10*3/uL (ref 150–400)
RBC: 4.52 MIL/uL (ref 3.87–5.11)
RDW: 14.1 % (ref 11.5–15.5)
WBC: 14.7 10*3/uL — ABNORMAL HIGH (ref 4.0–10.5)
nRBC: 0 % (ref 0.0–0.2)

## 2019-09-27 LAB — MAGNESIUM: Magnesium: 1.7 mg/dL (ref 1.7–2.4)

## 2019-09-27 LAB — LACTIC ACID, PLASMA
Lactic Acid, Venous: 2.9 mmol/L (ref 0.5–1.9)
Lactic Acid, Venous: 2.7 mmol/L (ref 0.5–1.9)

## 2019-09-27 LAB — RESPIRATORY PANEL BY RT PCR (FLU A&B, COVID)
Influenza A by PCR: NEGATIVE
Influenza B by PCR: NEGATIVE
SARS Coronavirus 2 by RT PCR: NEGATIVE

## 2019-09-27 LAB — COMPREHENSIVE METABOLIC PANEL
ALT: 16 U/L (ref 0–44)
AST: 20 U/L (ref 15–41)
Albumin: 3.6 g/dL (ref 3.5–5.0)
Alkaline Phosphatase: 48 U/L (ref 38–126)
Anion gap: 9 (ref 5–15)
BUN: 17 mg/dL (ref 8–23)
CO2: 27 mmol/L (ref 22–32)
Calcium: 8.9 mg/dL (ref 8.9–10.3)
Chloride: 98 mmol/L (ref 98–111)
Creatinine, Ser: 0.82 mg/dL (ref 0.44–1.00)
GFR calc Af Amer: >60 mL/min (ref >60)
GFR calc non Af Amer: >60 mL/min (ref >60)
Glucose, Bld: 308 mg/dL — ABNORMAL HIGH (ref 70–99)
Potassium: 3.8 mmol/L (ref 3.5–5.1)
Sodium: 134 mmol/L — ABNORMAL LOW (ref 135–145)
Total Bilirubin: 1.1 mg/dL (ref 0.3–1.2)
Total Protein: 6.8 g/dL (ref 6.5–8.1)

## 2019-09-27 LAB — APTT: aPTT: 37 seconds — ABNORMAL HIGH (ref 24–36)

## 2019-09-27 LAB — PROTIME-INR
INR: 1.1 (ref 0.8–1.2)
Prothrombin Time: 13.9 seconds (ref 11.4–15.2)

## 2019-09-27 MED ORDER — SODIUM CHLORIDE 0.9 % IV SOLN
500.0000 mg | Freq: Once | INTRAVENOUS | Status: AC
  Administered 2019-09-27: 500 mg via INTRAVENOUS
  Filled 2019-09-27: qty 500

## 2019-09-27 MED ORDER — ACETAMINOPHEN 650 MG RE SUPP: 650.0000 mg | Freq: Four times a day (QID) | RECTAL | Status: DC | PRN

## 2019-09-27 MED ORDER — ALBUTEROL SULFATE (2.5 MG/3ML) 0.083% IN NEBU
2.5000 mg | INHALATION_SOLUTION | RESPIRATORY_TRACT | Status: DC | PRN
  Administered 2019-10-01: 2.5 mg via RESPIRATORY_TRACT
  Filled 2019-09-27: qty 3

## 2019-09-27 MED ORDER — SODIUM CHLORIDE 0.9 % IV BOLUS
1000.0000 mL | Freq: Once | INTRAVENOUS | Status: AC
  Administered 2019-09-27: 1000 mL via INTRAVENOUS

## 2019-09-27 MED ORDER — MOMETASONE FURO-FORMOTEROL FUM 100-5 MCG/ACT IN AERO
2.0000 | INHALATION_SPRAY | Freq: Two times a day (BID) | RESPIRATORY_TRACT | Status: DC
  Administered 2019-09-28 – 2019-10-05 (×14): 2 via RESPIRATORY_TRACT
  Filled 2019-09-27 (×2): qty 8.8

## 2019-09-27 MED ORDER — ESCITALOPRAM OXALATE 10 MG PO TABS
5.0000 mg | ORAL_TABLET | Freq: Every day | ORAL | Status: DC
  Administered 2019-09-28 – 2019-10-03 (×6): 5 mg via ORAL
  Filled 2019-09-27 (×6): qty 1

## 2019-09-27 MED ORDER — ROSUVASTATIN CALCIUM 5 MG PO TABS
5.0000 mg | ORAL_TABLET | Freq: Every day | ORAL | Status: DC
  Administered 2019-09-28 – 2019-10-04 (×6): 5 mg via ORAL
  Filled 2019-09-27 (×7): qty 1

## 2019-09-27 MED ORDER — METOPROLOL SUCCINATE ER 25 MG PO TB24
25.0000 mg | ORAL_TABLET | Freq: Every day | ORAL | Status: DC
  Administered 2019-09-28 – 2019-09-30 (×3): 25 mg via ORAL
  Filled 2019-09-27 (×3): qty 1

## 2019-09-27 MED ORDER — SODIUM CHLORIDE 0.9 % IV SOLN
1.0000 g | INTRAVENOUS | Status: DC
  Administered 2019-09-28 – 2019-10-02 (×5): 1 g via INTRAVENOUS
  Filled 2019-09-27 (×5): qty 1

## 2019-09-27 MED ORDER — FLUTICASONE PROPIONATE (INHAL) 250 MCG/BLIST IN AEPB
1.0000 | INHALATION_SPRAY | Freq: Two times a day (BID) | RESPIRATORY_TRACT | Status: DC
  Filled 2019-09-27: qty 1

## 2019-09-27 MED ORDER — ACETAMINOPHEN 325 MG PO TABS
650.0000 mg | ORAL_TABLET | Freq: Four times a day (QID) | ORAL | Status: DC | PRN
  Administered 2019-09-28 – 2019-10-03 (×7): 650 mg via ORAL
  Filled 2019-09-27 (×8): qty 2

## 2019-09-27 MED ORDER — DONEPEZIL HCL 10 MG PO TABS
10.0000 mg | ORAL_TABLET | Freq: Every day | ORAL | Status: DC
  Administered 2019-09-28 – 2019-10-04 (×8): 10 mg via ORAL
  Filled 2019-09-27 (×8): qty 1

## 2019-09-27 MED ORDER — ASPIRIN 81 MG PO CHEW
81.0000 mg | CHEWABLE_TABLET | Freq: Every day | ORAL | Status: DC
  Administered 2019-09-28 – 2019-10-05 (×8): 81 mg via ORAL
  Filled 2019-09-27 (×8): qty 1

## 2019-09-27 MED ORDER — SODIUM CHLORIDE 0.9 % IV SOLN
500.0000 mg | INTRAVENOUS | Status: AC
  Administered 2019-09-28 – 2019-10-01 (×4): 500 mg via INTRAVENOUS
  Filled 2019-09-27 (×6): qty 500

## 2019-09-27 MED ORDER — DOCUSATE SODIUM 100 MG PO CAPS
100.0000 mg | ORAL_CAPSULE | Freq: Every day | ORAL | Status: DC
  Administered 2019-09-28 – 2019-10-05 (×8): 100 mg via ORAL
  Filled 2019-09-27 (×8): qty 1

## 2019-09-27 MED ORDER — HALOPERIDOL LACTATE 5 MG/ML IJ SOLN
2.0000 mg | Freq: Once | INTRAMUSCULAR | Status: AC
  Administered 2019-09-27: 2 mg via INTRAVENOUS
  Filled 2019-09-27: qty 1

## 2019-09-27 MED ORDER — SODIUM CHLORIDE 0.9 % IV SOLN: INTRAVENOUS | Status: AC

## 2019-09-27 MED ORDER — INSULIN ASPART 100 UNIT/ML ~~LOC~~ SOLN
0.0000 [IU] | Freq: Three times a day (TID) | SUBCUTANEOUS | Status: DC
  Administered 2019-09-28: 2 [IU] via SUBCUTANEOUS
  Administered 2019-09-28 – 2019-09-29 (×3): 1 [IU] via SUBCUTANEOUS
  Administered 2019-09-29 – 2019-09-30 (×2): 2 [IU] via SUBCUTANEOUS
  Administered 2019-09-30 (×2): 1 [IU] via SUBCUTANEOUS
  Administered 2019-10-01 – 2019-10-02 (×3): 3 [IU] via SUBCUTANEOUS
  Administered 2019-10-02: 1 [IU] via SUBCUTANEOUS
  Administered 2019-10-02: 3 [IU] via SUBCUTANEOUS
  Administered 2019-10-03: 2 [IU] via SUBCUTANEOUS
  Administered 2019-10-03: 1 [IU] via SUBCUTANEOUS
  Administered 2019-10-03 – 2019-10-04 (×2): 2 [IU] via SUBCUTANEOUS
  Administered 2019-10-05: 5 [IU] via SUBCUTANEOUS
  Filled 2019-09-27: qty 0.09

## 2019-09-27 MED ORDER — SODIUM CHLORIDE 0.9 % IV SOLN
1.0000 g | Freq: Once | INTRAVENOUS | Status: AC
  Administered 2019-09-27: 1 g via INTRAVENOUS
  Filled 2019-09-27: qty 10

## 2019-09-27 MED ORDER — SODIUM CHLORIDE 0.9% FLUSH
3.0000 mL | Freq: Two times a day (BID) | INTRAVENOUS | Status: DC
  Administered 2019-09-28 – 2019-10-05 (×10): 3 mL via INTRAVENOUS

## 2019-09-27 MED ORDER — SODIUM CHLORIDE 0.9 % IV BOLUS
1000.0000 mL | Freq: Once | INTRAVENOUS | Status: AC
  Administered 2019-09-27: 17:00:00 1000 mL via INTRAVENOUS

## 2019-09-27 MED ORDER — CLOPIDOGREL BISULFATE 75 MG PO TABS
75.0000 mg | ORAL_TABLET | Freq: Every day | ORAL | Status: DC
  Administered 2019-09-28 – 2019-10-05 (×7): 75 mg via ORAL
  Filled 2019-09-27 (×9): qty 1

## 2019-09-27 NOTE — ED Notes (Signed)
Family currently on phone with patient to help keep calm and discuss oxygen compliance. Provider made aware.

## 2019-09-27 NOTE — ED Notes (Signed)
Patient attempted to ambulate around the room. Patient had difficulty standing up and oxygen saturation dropped to 88%. Patient became weak and assisted back into the bed.   Provider made aware.

## 2019-09-27 NOTE — ED Notes (Signed)
Patient continues to take off nasal cannula causing her oxygen saturation to drop. Nasal cannula reapplied and patient educated on importance of wearing oxygen

## 2019-09-27 NOTE — H&P (Signed)
History and Physical    PLEASE NOTE THAT DRAGON DICTATION SOFTWARE WAS USED IN THE CONSTRUCTION OF THIS NOTE.   [name] DQQ:[id number] DOB: 11/09/1922 DOA: 09/27/2019  PCP: Prince Solian, MD Patient coming from: SNF  I have personally briefly reviewed patient's old medical records in [place]  Chief Complaint: objective fever  HPI: [name] is a 86 y.o. female with medical history significant for dementia, type 2 diabetes mellitus, ischemic CVA, hypertension, urinary incontinence, asthma, who is admitted to [facility] on 09/27/2019 with severe sepsis due to left lower lobe pneumonia after presenting from SNF to [facility] Emergency Department for evaluation of objective fever.   In the setting of the patient's dementia, the following history is provided by SNF staff as well as my discussions with the emergency department physician, Dr. Wyvonnia Dusky, who discussed the patient's case with her son, who is an ophthalmologist in [place]. Additional information obtained via chart review.  The patient reportedly has been experiencing an objective fever over the last 1 day, with a reported temperature max over that time of 101F. This was reportedly associated with new onset nonproductive cough in the absence of any associated shortness of breath, chest pain, headache, neck stiffness, rhinitis, rhinorrhea, sore throat, nausea, vomiting, abdominal pain, rash, or hemoptysis. This was also reportedly not associated with any chills, full body rigors, or generalized myalgias. The patient reportedly conveyed 1 to 2 days of dysuria, and has been exhibiting menstrual intake over that timeframe.   No recent traveling or known COVID-19 exposures.   Past medical history is notable for asthma, for which outpatient respiratory regimen consists of Flovent as well as as needed albuterol. No baseline supplemental oxygen requirements.    ED Course:  Vital signs in the ED were  notable for the following: Temperature max 100; heart rate 79-92; blood pressure 134/52-152/59; respiratory rate 16-25; oxygen saturation noted to be in the mid 80's with ambulation, and subsequently improving to 94 to 95% 2 L nasal cannula.  Labs were notable for the following: CMP notable for sodium 134, bicarbonate 27, BUN 17, creatinine 0.82, BUN/creatinine ratio 20.7, glucose 300, liver enzymes were found to be within normal limits. Urinalysis was ordered, with result currently pending. BC notable for white blood cell count of 14,700 with 83% neutrophils, hemoglobin 13. Initial lactic acid 2.9. Nasopharyngeal COVID-19 PCR performed in the ED this evening was found to be negative; influenza A/B was also found to be negative. Blood cultures x2 were collected prior to initiation of any antibiotics.  Chest x-ray, per final radiology report, showed evidence of left basilar airspace opacity, concerning for infiltrate in the absence of any evidence of edema, pleural effusion, or pneumothorax.  While in the ED, the following were administered: Rocephin 1 g IV x1, azithromycin 500 mg IV x1, and a 2 L IV normal saline bolus. Subsequently, the patient was admitted to the med telemetry floor for further evaluation and management of presenting severe sepsis due to suspected left lower lobe pneumonia.    Review of Systems: As per HPI otherwise 10 point review of systems negative.   Past Medical History:  Diagnosis Date  . Diabetes ([place])   . Hearing loss   . Thalamic infarction [facility])     Past Surgical History:  Procedure Laterality Date  . fractured rib      Social History:  reports that she has never smoked. She has never used smokeless tobacco. She reports that she does not drink  alcohol or use drugs.   Allergies  Allergen Reactions  . Sulfonamide Derivatives Other (See Comments)    Nervous, agitation    Family History  Problem Relation Age of Onset  . Cancer Mother   . Heart disease  Sister   . Diabetes Other   . Stroke Other      Prior to Admission medications   Medication Sig Start Date End Date Taking? Authorizing Provider  amLODipine-olmesartan (AZOR) 5-40 MG tablet Take 1 tablet by mouth daily.  06/13/16  Yes [provider]  aspirin 81 MG chewable tablet Chew 81 mg by mouth daily.   Yes [provider]  carbamide peroxide (GNP EARWAX REMOVAL DROPS) 6.5 % OTIC solution Place 5 drops into both ears 2 (two) times daily as needed (ear wax buildup).   Yes [provider]  Cholecalciferol 25 MCG (1000 UT) capsule Take 1,000 Units by mouth daily.    Yes [provider]  clopidogrel (PLAVIX) 75 MG tablet TAKE 1 TABLET BY MOUTH DAILY Patient taking differently: Take 75 mg by mouth daily.  08/28/16  Yes Garvin Fila, MD  docusate sodium (COLACE) 100 MG capsule Take 100 mg by mouth daily.   Yes [provider]  donepezil (ARICEPT) 5 MG tablet Take 10 mg by mouth at bedtime.  06/13/16  Yes [provider]  escitalopram (LEXAPRO) 5 MG tablet Take 5 mg by mouth daily.   Yes [provider]  Ferrous Sulfate (IRON) 325 (65 Fe) MG TABS Take 650 mg by mouth daily.   Yes [provider]  Fluticasone Propionate, Inhal, (FLOVENT DISKUS) 250 MCG/BLIST AEPB Inhale 1 puff into the lungs 2 (two) times daily.   Yes [provider]  loratadine (CLARITIN) 10 MG tablet Take 10 mg by mouth daily as needed for allergies.    Yes [provider]  metoprolol succinate (TOPROL-XL) 25 MG 24 hr tablet Take 25 mg by mouth daily.  05/20/14  Yes [provider]  Multiple Vitamin (MULTIVITAMIN) tablet Take 1 tablet by mouth daily.   Yes [provider]  MYRBETRIQ 50 MG TB24 tablet Take 50 mg by mouth daily.  06/13/16  Yes [provider]  PROAIR HFA 108 (90 BASE) MCG/ACT inhaler Inhale 2 puffs into the lungs every 4 (four) hours as needed for wheezing.  02/21/15  Yes [provider]    Psyllium (KONSYL DAILY FIBER) 28.3 % PACK Take 1 each by mouth daily.   Yes [provider]  rosuvastatin (CRESTOR) 5 MG tablet Take 5 mg by mouth daily.   Yes [provider]  clopidogrel (PLAVIX) 75 MG tablet TAKE 1 TABLET BY MOUTH DAILY Patient not taking: Reported on 08/15/2017 09/29/16   Garvin Fila, MD  mometasone-formoterol (DULERA) 100-5 MCG/ACT AERO Inhale 2 puffs into the lungs 2 (two) times daily. Patient not taking: Reported on 08/15/2017 08/14/16   Parrett, Fonnie Mu, NP  Spacer/Aero-Holding Chambers (AEROCHAMBER MV) inhaler Use as instructed 08/15/16   Melvenia Needles, NP     Objective    Physical Exam: Vitals:   09/27/19 1830 09/27/19 1900 09/27/19 2000 09/27/19 2001  BP: (!) 157/59 (!) 134/52 (!) 161/65   Pulse: 77 79 92 92  Resp: (!) 24 (!) 25 (!) 32 (!) 32  Temp:      TempSrc:      SpO2: 94% 93% 91% 94%    General: appears to be stated age; alert Skin: warm, dry, no rash Head:  AT/[place] Eyes:  PEARL  b/l, EOMI Mouth:  Oral mucosa membranes appear dry, normal dentition Neck: supple; trachea midline Heart:  RRR; did not appreciate any M/R/G Lungs: left basilar rales noted; otherwise, CTAB;  did not appreciate any wheezes or rhonchi Abdomen: + BS; soft, ND, NT Vascular: 2+ pedal pulses b/l; 2+ radial pulses b/l Extremities: no peripheral edema, no muscle wasting   Labs on Admission: I have personally reviewed following labs and imaging studies  CBC: Recent Labs  Lab 09/27/19 1718  WBC 14.7*  NEUTROABS 13.4*  HGB 13.3  HCT 40.7  MCV 90.0  PLT 237   Basic Metabolic Panel: Recent Labs  Lab 09/27/19 1718  NA 134*  K 3.8  CL 98  CO2 27  GLUCOSE 308*  BUN 17  CREATININE 0.82  CALCIUM 8.9   GFR: CrCl cannot be calculated (Unknown ideal weight.). Liver Function Tests: Recent Labs  Lab 09/27/19 1718  AST 20  ALT 16  ALKPHOS 48  BILITOT 1.1  PROT 6.8  ALBUMIN 3.6   No results for input(s): LIPASE, AMYLASE in the last  168 hours. No results for input(s): AMMONIA in the last 168 hours. Coagulation Profile: Recent Labs  Lab 09/27/19 1718  INR 1.1   Cardiac Enzymes: No results for input(s): CKTOTAL, CKMB, CKMBINDEX, TROPONINI in the last 168 hours. BNP (last 3 results) No results for input(s): PROBNP in the last 8760 hours. HbA1C: No results for input(s): HGBA1C in the last 72 hours. CBG: No results for input(s): GLUCAP in the last 168 hours. Lipid Profile: No results for input(s): CHOL, HDL, LDLCALC, TRIG, CHOLHDL, LDLDIRECT in the last 72 hours. Thyroid Function Tests: No results for input(s): TSH, T4TOTAL, FREET4, T3FREE, THYROIDAB in the last 72 hours. Anemia Panel: No results for input(s): VITAMINB12, FOLATE, FERRITIN, TIBC, IRON, RETICCTPCT in the last 72 hours. Urine analysis:    Component Value Date/Time   COLORURINE YELLOW 08/15/2017 [place] 08/15/2017 1821   LABSPEC 1.009 08/15/2017 1821   PHURINE 7.0 08/15/2017 1821   GLUCOSEU NEGATIVE 08/15/2017 1821   HGBUR SMALL (A) 08/15/2017 1821   BILIRUBINUR NEGATIVE 08/15/2017 1821   KETONESUR 5 (A) 08/15/2017 1821   PROTEINUR NEGATIVE 08/15/2017 1821   NITRITE NEGATIVE 08/15/2017 1821   LEUKOCYTESUR LARGE (A) 08/15/2017 1821    Radiological Exams on Admission: DG Chest Port 1 View  Result Date: 09/27/2019 CLINICAL DATA:  Sepsis. EXAM: PORTABLE CHEST 1 VIEW COMPARISON:  July 29, 2016. FINDINGS: Stable cardiomediastinal silhouette. Atherosclerosis of thoracic aorta is noted. No pneumothorax or pleural effusion is noted. Right lung is clear. Mild left basilar atelectasis or infiltrate is noted. Bony thorax is unremarkable. IMPRESSION: Aortic atherosclerosis. Mild left basilar atelectasis or infiltrate is noted. Aortic Atherosclerosis (ICD10-I70.0). Electronically Signed   By: Marijo Conception M.D.   On: 09/27/2019 18:29     Assessment/Plan   [name] is a 86 y.o. female with medical history significant for  dementia, type 2 diabetes mellitus, ischemic CVA, hypertension, urinary incontinence, asthma, who is admitted to [facility] on 09/27/2019 with severe sepsis due to left lower lobe pneumonia after presenting from SNF to [facility] Emergency Department for evaluation of objective fever.    Principal Problem:   LLL pneumonia Active Problems:   Essential hypertension   Severe sepsis (HCC)   Fever   Generalized weakness   Dehydration   #) Severe sepsis due to left lower lobe pneumonia: Diagnosis on the basis of presenting 1 day of objective fever associated with new  onset nonproductive cough, with increased work of breathing, acute hypoxic respiratory distress, as further described low, and chest x-ray demonstrating evidence of left lower lobe infiltrate. SIRS criteria met via presenting fever, tachypnea, and leukocytosis. Criteria are met for patient sepsis to be considered severe nature on the basis of concomitant presenting elevated lactic acid as well as aforementioned acute hypoxic respiratory distress. Presentation has not been associated with any evidence of hypotension thus far. No other source of underlying infection has been identified at this time, although urinalysis is currently pending. Blood cultures x2 were collected in the emergency department this evening, followed by initiation of Rocephin and azithromycin. Of note, Covid PCR is found to be negative, as well as rapid influenza, as above.   Plan: Repeat lactic acid. In the meantime I have ordered gentle IV fluids in the form of normal saline at 50 cc/h times a duration of 12 hours. Follow for results blood cultures x2. Continue Rocephin and azithromycin. Repeat CBC with differential in the morning. Check strep pneumonia urine antigen. As needed acetaminophen for fever. Flutter valve. Additional work-up and management of associated acute hypoxic respiratory stress, as further described low. Follow-up on result of  urinalysis. In the setting of presenting severe sepsis, will hold home antihypertensive medications for now. Droplet precautions.      #) Acute hypoxic respite distress: In the context of no reported baseline supplemental oxygen requirements, the patient was noted to desaturate into the mid 80s with ambulation while on room air. Subsequent oxygen saturations have improved into the mid 90s on 2 L nasal cannula. This appears to be on the basis of presenting left lower lobe pneumonia, as further described above. Of note, COVID-19 PCR performed this evening is found to be negative. Clinically, acute pulmonary embolism appears to be less likely at this time. No clinical or radiographic evidence to suggest acutely decompensated heart failure. In the setting of documented history of asthma, considered initiating systemic corticosteroids, but I have refrained at this time in the absence of any associated wheezing or reported shortness of breath.   Plan: Work-up management of severe sepsis due to left lower lobe pneumonia, as above. Flutter valve. Monitor on continuous pulse oximetry. As needed supplemental oxygen in order to maintain oxygen saturations greater than or equal to 92%. Continue home Flovent. As needed albuterol. Check blood gas. Monitor on telemetry.      #) Generalized weakness: Per SNF staff, the patient has required additional assistance with transfers and ambulation relative to her baseline requirements, in the absence of any overt acute focal neurologic deficits. Patient's generalized weakness is suspected to be multifactorial in nature, with contributions from presenting severe sepsis due to to left lower lobe pneumonia as well as associated acute hypoxic respiratory distress in addition to suspected dehydration due to recently diminished oral intake, as above.  Plan: Work-up and management of severe sepsis due to left lower lobe pneumonia as well as hypoxic respiratory distress. Gentle IV  fluids, as above. I have placed a physical therapy consult to occur in the morning. Fall precautions.       #) Dehydration: Patient appears to be clinically dry, evidence of dry oral mucosa membranes as well as prerenal azotemia. This appears to be the basis of intravascular depletion stemming from recent decline in oral intake, as above. No evidence of associated hypotension thus far.   Plan: Gentle IV fluids, as above. Monitor strict I's and O's and daily weights. Repeat BMP in the morning.      #)  Type 2 Diabetes Mellitus: presenting blood sugar noted to be elevated at 300. As an outpatient, appears to be managed via lifestyle modifications, without insulin or oral hypoglycemic agents appearing on outpatient medication list.  Plan: accuchecks QAC and HS with low dose sliding scale insulin. Check hemoglobin A1c.      #) Essential hypertension: Outpatient antihypertensive regimen consists of amlodipine, losartan, and Toprol-XL. Systolic blood pressures thus far have noted to be in the range of the 130's - 150's. However, in the context of presenting severe sepsis, will hold home antihypertensive medications for now.  Plan: Close monitoring of ensuing blood pressures a routine vital signs. Hold home antihypertensive medications for now, as above.      #) History of ischemic CVA: Per chart review, it appears that the patient is currently on dual antiplatelet therapy. No evidence of acute focal neurologic deficits at this time. Will attempt additional chart review to determine the timing of patient's acute ischemic CVA as well as the indication for continuation of dual antiplatelet therapy even this timeframe.  Plan: Additional chart review to determine necessity of ongoing dual antiplatelet therapy.      #) Mild persistent asthma: Outpatient respiratory regimen includes Flovent as well as as needed albuterol. At this time, the patient does not appear to be experiencing an overt  acute asthma exacerbation, although she is certainly at risk of developing this on the basis of presenting left lower lobe pneumonia.   Plan: Monitor on continuous pulse oximetry. As needed supplemental oxygen in order to maintain oxygen saturations greater than or equal to 92%. Continue home Flovent as well as as needed albuterol. Close monitoring of clinical picture, including trending of oxygen saturations as well as monitoring for development of wheezing to guide possibility of initiation of systemic corticosteroids in the setting of increased risk for acute asthma exacerbation, as above. Check blood gas.     DVT prophylaxis: SCDs Code Status: Full code Family Communication: Patient's case was discussed with her son, who is an ophthalmologist in [place]; his phone number is [phone]. Disposition Plan: Per Rounding Team Consults called: None Admission status: Inpatient; med telemetry.   PLEASE NOTE THAT DRAGON DICTATION SOFTWARE WAS USED IN THE CONSTRUCTION OF THIS NOTE.   [place] [facility] Pager [phone] From [place].   Otherwise, please contact night-coverage  [email or web address] Password [email or web address]  09/27/2019, 8:12 PM

## 2019-09-27 NOTE — ED Notes (Signed)
Rn and this Clinical research associate repeatedly told patient to keep nasal cannula in her nose. Pt refused to do so. Pt was then placed on NRB by RN and told to keep it on her face. Pt immediately pulled off NRB mask as soon as Charity fundraiser and this Clinical research associate left the room.

## 2019-09-27 NOTE — ED Notes (Signed)
Patient placed on 2L O2 

## 2019-09-27 NOTE — ED Triage Notes (Signed)
EMS reports from [facility], staff called for weakness and failure to thrive. EMS noted temp of 101.0 and Pt states having difficulty urinating.  BP 140/60 HR 90 RR 20 Sp02 94 RA CBG 260 Temp 101.0  GFiven 1gram Tylenol enroute

## 2019-09-27 NOTE — ED Provider Notes (Signed)
[facility]-EMERGENCY DEPT Provider Note   CSN: [id number] Arrival date & time: 09/27/19  1643     History Chief Complaint  Patient presents with  . Weakness  . Dysuria  . Fever    [name] is a 86 y.o. female.  Patient sent from her living facility with a 1 day history of generalized weakness, fever to 101, pain with urination and low back pain.  Patient states she feels weak and and has pain with urination and had difficulty with urinating yesterday.  EMS noted a temperature of 101 and generalized weakness.  She also complains of body aches, cough and headache.  Patient's son is an ophthalmologist in [place].  He called prior to patient's arrival.  His number is [phone].  The history is provided by the patient, the EMS personnel and a relative.  Weakness Associated symptoms: dysuria, fever, headaches and urgency   Associated symptoms: no abdominal pain, no chest pain, no cough, no nausea, no shortness of breath and no vomiting   Dysuria Associated symptoms: fever   Associated symptoms: no abdominal pain, no nausea and no vomiting   Fever Associated symptoms: dysuria and headaches   Associated symptoms: no chest pain, no congestion, no cough, no nausea, no rhinorrhea and no vomiting        Past Medical History:  Diagnosis Date  . Diabetes (HCC)   . Hearing loss   . Thalamic infarction [facility])     Patient Active Problem List   Diagnosis Date Noted  . Mild cognitive impairment with memory loss 06/08/2014  . SORE THROAT 04/10/2010  . Dyspnea 10/25/2009  . COUGH 10/25/2009  . HYPERLIPIDEMIA 10/24/2009  . DEPRESSION 10/24/2009  . HYPERTENSION 10/24/2009  . CAD 10/24/2009  . ALLERGIC RHINITIS, SEASONAL 10/24/2009  . Asthma 10/24/2009  . OSTEOPENIA 10/24/2009    Past Surgical History:  Procedure Laterality Date  . fractured rib       OB History   No obstetric history on file.     Family History  Problem Relation Age of Onset   . Cancer Mother   . Heart disease Sister   . Diabetes Other   . Stroke Other     Social History   Tobacco Use  . Smoking status: Never Smoker  . Smokeless tobacco: Never Used  Substance Use Topics  . Alcohol use: No  . Drug use: No    Home Medications Prior to Admission medications   Medication Sig Start Date End Date Taking? Authorizing Provider  amLODipine-olmesartan (AZOR) 5-40 MG tablet Take 1 tablet by mouth daily.  06/13/16   [provider]  aspirin 81 MG chewable tablet Chew 81 mg by mouth daily.    [provider]  cholecalciferol (VITAMIN D) 400 units TABS tablet Take 400 Units by mouth daily.    [provider]  clopidogrel (PLAVIX) 75 MG tablet TAKE 1 TABLET BY MOUTH DAILY 08/28/16   Micki Riley, MD  clopidogrel (PLAVIX) 75 MG tablet TAKE 1 TABLET BY MOUTH DAILY Patient not taking: Reported on 08/15/2017 09/29/16   Micki Riley, MD  donepezil (ARICEPT) 5 MG tablet Take 5 mg by mouth daily.  06/13/16   [provider]  Fluticasone Propionate, Inhal, (FLOVENT DISKUS) 250 MCG/BLIST AEPB Inhale 1 puff into the lungs 2 (two) times daily.    [provider]  glimepiride (AMARYL) 1 MG tablet Take 1 mg by mouth daily.  02/02/15   [provider]  loratadine (CLARITIN) 10 MG  tablet Take 10 mg by mouth at bedtime.    [provider]  metoprolol succinate (TOPROL-XL) 25 MG 24 hr tablet Take 25 mg by mouth daily.  05/20/14   [provider]  mometasone-formoterol (DULERA) 100-5 MCG/ACT AERO Inhale 2 puffs into the lungs 2 (two) times daily. Patient not taking: Reported on 08/15/2017 08/14/16   Parrett, Virgel Bouquet, NP  Multiple Vitamin (MULTIVITAMIN) tablet Take 1 tablet by mouth daily.    [provider]  MYRBETRIQ 25 MG TB24 tablet Take 25 mg by mouth daily.  06/13/16   [provider]  Omega-3 Fatty Acids (FISH OIL) 1000 MG CAPS Take 1,000 mg by mouth daily.    [provider]    PROAIR HFA 108 (90 BASE) MCG/ACT inhaler INHALE 2 PUFFS EVERY 4 HOURS AS NEEDED FOR WHEEZING 02/21/15   [provider]  rosuvastatin (CRESTOR) 5 MG tablet Take 5 mg by mouth daily.    [provider]  Spacer/Aero-Holding Chambers (AEROCHAMBER MV) inhaler Use as instructed 08/15/16   Parrett, Virgel Bouquet, NP    Allergies    Sulfonamide derivatives  Review of Systems   Review of Systems  Constitutional: Positive for activity change, appetite change, fatigue and fever.  HENT: Negative for congestion and rhinorrhea.   Respiratory: Negative for cough and shortness of breath.   Cardiovascular: Negative for chest pain.  Gastrointestinal: Negative for abdominal pain, nausea and vomiting.  Genitourinary: Positive for difficulty urinating, dysuria and urgency.  Musculoskeletal: Positive for back pain.  Neurological: Positive for weakness and headaches.   all other systems are negative except as noted in the HPI and PMH.    Physical Exam Updated Vital Signs BP 110/80   Pulse 92   Temp 100 F (37.8 C) (Oral)   Resp 16   SpO2 100%   Physical Exam Vitals and nursing note reviewed.  Constitutional:      General: She is not in acute distress.    Appearance: She is well-developed.  HENT:     Head: Normocephalic and atraumatic.     Mouth/Throat:     Mouth: Mucous membranes are dry.     Pharynx: No oropharyngeal exudate.  Eyes:     Conjunctiva/sclera: Conjunctivae normal.     Pupils: Pupils are equal, round, and reactive to light.  Neck:     Comments: No meningismus. Cardiovascular:     Rate and Rhythm: Normal rate and regular rhythm.     Heart sounds: Normal heart sounds. No murmur.  Pulmonary:     Effort: Pulmonary effort is normal. No respiratory distress.     Breath sounds: Normal breath sounds.  Abdominal:     Palpations: Abdomen is soft.     Tenderness: There is no abdominal tenderness. There is no guarding or rebound.  Musculoskeletal:        General:  Tenderness present. Normal range of motion.     Cervical back: Normal range of motion and neck supple.     Comments: Paraspinal lumbar tenderness bilateral  Skin:    General: Skin is warm.  Neurological:     Mental Status: She is alert.     Cranial Nerves: No cranial nerve deficit.     Motor: No abnormal muscle tone.     Coordination: Coordination normal.     Comments: Moves all extremities, answers questions appropriately, 5/5 strength throughout, no facial asymmetry  Psychiatric:        Behavior: Behavior normal.     ED Results / Procedures /  Treatments   Labs (all labs ordered are listed, but only abnormal results are displayed) Labs Reviewed  LACTIC ACID, PLASMA - Abnormal; Notable for the following components:      Result Value   Lactic Acid, Venous 2.9 (*)    All other components within normal limits  LACTIC ACID, PLASMA - Abnormal; Notable for the following components:   Lactic Acid, Venous 2.7 (*)    All other components within normal limits  COMPREHENSIVE METABOLIC PANEL - Abnormal; Notable for the following components:   Sodium 134 (*)    Glucose, Bld 308 (*)    All other components within normal limits  CBC WITH DIFFERENTIAL/PLATELET - Abnormal; Notable for the following components:   WBC 14.7 (*)    Neutro Abs 13.4 (*)    All other components within normal limits  APTT - Abnormal; Notable for the following components:   aPTT 37 (*)    All other components within normal limits  URINALYSIS, ROUTINE W REFLEX MICROSCOPIC - Abnormal; Notable for the following components:   Glucose, UA >=500 (*)    Ketones, ur 5 (*)    Protein, ur 30 (*)    All other components within normal limits  RESPIRATORY PANEL BY RT PCR (FLU A&B, COVID)  CULTURE, BLOOD (ROUTINE X 2)  CULTURE, BLOOD (ROUTINE X 2)  URINE CULTURE  PROTIME-INR  MAGNESIUM  MAGNESIUM  BASIC METABOLIC PANEL  CBC WITH DIFFERENTIAL/PLATELET  STREP PNEUMONIAE URINARY ANTIGEN  BLOOD GAS, ARTERIAL  HEMOGLOBIN  A1C  POC SARS CORONAVIRUS 2 AG -  ED    EKG EKG Interpretation  Date/Time:  Tuesday September 27 2019 17:52:32 EST Ventricular Rate:  88 PR Interval:  222 QRS Duration: 70 QT Interval:  398 QTC Calculation: 481 R Axis:   -44 Text Interpretation: Sinus rhythm with 1st degree A-V block Left axis deviation Moderate voltage criteria for LVH, may be normal variant T wave abnormality, consider lateral ischemia Prolonged QT Abnormal ECG No significant change was found Confirmed by Glynn Octave [phone]) on 09/27/2019 5:58:02 PM   Radiology DG Chest Port 1 View  Result Date: 09/27/2019 CLINICAL DATA:  Sepsis. EXAM: PORTABLE CHEST 1 VIEW COMPARISON:  July 29, 2016. FINDINGS: Stable cardiomediastinal silhouette. Atherosclerosis of thoracic aorta is noted. No pneumothorax or pleural effusion is noted. Right lung is clear. Mild left basilar atelectasis or infiltrate is noted. Bony thorax is unremarkable. IMPRESSION: Aortic atherosclerosis. Mild left basilar atelectasis or infiltrate is noted. Aortic Atherosclerosis (ICD10-I70.0). Electronically Signed   By: Lupita Raider M.D.   On: 09/27/2019 18:29    Procedures .Critical Care Performed by: Glynn Octave, MD Authorized by: Glynn Octave, MD   Critical care provider statement:    Critical care time (minutes):  35   Critical care was necessary to treat or prevent imminent or life-threatening deterioration of the following conditions:  Sepsis   Critical care was time spent personally by me on the following activities:  Discussions with consultants, evaluation of patient's response to treatment, examination of patient, ordering and performing treatments and interventions, ordering and review of laboratory studies, ordering and review of radiographic studies, pulse oximetry, re-evaluation of patient's condition, obtaining history from patient or surrogate and review of old charts   (including critical care time)  Medications Ordered in  ED Medications  sodium chloride 0.9 % bolus 1,000 mL (has no administration in time range)  cefTRIAXone (ROCEPHIN) 1 g in sodium chloride 0.9 % 100 mL IVPB (has no administration in time range)  ED Course  I have reviewed the triage vital signs and the nursing notes.  Pertinent labs & imaging results that were available during my care of the patient were reviewed by me and considered in my medical decision making (see chart for details).    MDM Rules/Calculators/A&P                     Patient from facility with decreased p.o. intake, failure to thrive, weakness, fever, and difficulty with urination.  Patient found to be febrile, tachypneic.  She is complaining of some low back pain.  Patient given IV fluids.  Code sepsis activated.  She is given antibiotics after cultures are obtained.  X-ray is concerning for left-sided infiltrates. Lactate elevation at 2.9. Coronavirus test is negative. UA without obvious infection. Will treat for suspected pneumonia.   With attempted ambulation, patient desaturates to the mid 80s and has generalized weakness.  She will need admission for pneumonia with hypoxia. Discussed with patient's son who agrees with plan for admission  D/w Dr. Velia Meyer.   [name] was evaluated in Emergency Department on 09/27/2019 for the symptoms described in the history of present illness. She was evaluated in the context of the global COVID-19 pandemic, which necessitated consideration that the patient might be at risk for infection with the SARS-CoV-2 virus that causes COVID-19. Institutional protocols and algorithms that pertain to the evaluation of patients at risk for COVID-19 are in a state of rapid change based on information released by regulatory bodies including the CDC and federal and state organizations. These policies and algorithms were followed during the patient's care in the ED.  Final Clinical Impression(s) / ED Diagnoses Final diagnoses:   Community acquired pneumonia of left lower lobe of lung    Rx / DC Orders ED Discharge Orders    None       Sunil Hue, Annie Main, MD 09/28/19 0225

## 2019-09-27 NOTE — ED Notes (Signed)
Provider informed of lab value lactic = 2.9

## 2019-09-28 DIAGNOSIS — A419 Sepsis, unspecified organism: Secondary | ICD-10-CM | POA: Diagnosis present

## 2019-09-28 DIAGNOSIS — R531 Weakness: Secondary | ICD-10-CM

## 2019-09-28 DIAGNOSIS — E860 Dehydration: Secondary | ICD-10-CM | POA: Diagnosis present

## 2019-09-28 DIAGNOSIS — R509 Fever, unspecified: Secondary | ICD-10-CM | POA: Diagnosis present

## 2019-09-28 LAB — CBC WITH DIFFERENTIAL/PLATELET
Abs Immature Granulocytes: 0.03 10*3/uL (ref 0.00–0.07)
Basophils Absolute: 0.1 10*3/uL (ref 0.0–0.1)
Basophils Relative: 0 %
Eosinophils Absolute: 0 10*3/uL (ref 0.0–0.5)
Eosinophils Relative: 0 %
HCT: 40.2 % (ref 36.0–46.0)
Hemoglobin: 13 g/dL (ref 12.0–15.0)
Immature Granulocytes: 0 %
Lymphocytes Relative: 5 %
Lymphs Abs: 0.7 10*3/uL (ref 0.7–4.0)
MCH: 29.5 pg (ref 26.0–34.0)
MCHC: 32.3 g/dL (ref 30.0–36.0)
MCV: 91.4 fL (ref 80.0–100.0)
Monocytes Absolute: 0.5 10*3/uL (ref 0.1–1.0)
Monocytes Relative: 4 %
Neutro Abs: 12.9 10*3/uL — ABNORMAL HIGH (ref 1.7–7.7)
Neutrophils Relative %: 91 %
Platelets: 252 10*3/uL (ref 150–400)
RBC: 4.4 MIL/uL (ref 3.87–5.11)
RDW: 14.4 % (ref 11.5–15.5)
WBC: 14.2 10*3/uL — ABNORMAL HIGH (ref 4.0–10.5)
nRBC: 0 % (ref 0.0–0.2)

## 2019-09-28 LAB — BLOOD GAS, ARTERIAL
Bicarbonate: 25.6 mmol/L (ref 20.0–28.0)
Patient temperature: 99.9
pCO2 arterial: 47.1 mmHg (ref 32.0–48.0)
pH, Arterial: 7.358 (ref 7.350–7.450)
pO2, Arterial: 92.9 mmHg (ref 83.0–108.0)

## 2019-09-28 LAB — URINE CULTURE: Culture: NO GROWTH

## 2019-09-28 LAB — GLUCOSE, CAPILLARY
Glucose-Capillary: 149 mg/dL — ABNORMAL HIGH (ref 70–99)
Glucose-Capillary: 149 mg/dL — ABNORMAL HIGH (ref 70–99)
Glucose-Capillary: 164 mg/dL — ABNORMAL HIGH (ref 70–99)
Glucose-Capillary: 171 mg/dL — ABNORMAL HIGH (ref 70–99)

## 2019-09-28 LAB — BASIC METABOLIC PANEL
Anion gap: 11 (ref 5–15)
BUN: 16 mg/dL (ref 8–23)
CO2: 23 mmol/L (ref 22–32)
Calcium: 8.2 mg/dL — ABNORMAL LOW (ref 8.9–10.3)
Chloride: 105 mmol/L (ref 98–111)
Creatinine, Ser: 0.61 mg/dL (ref 0.44–1.00)
GFR calc Af Amer: >60 mL/min (ref >60)
GFR calc non Af Amer: >60 mL/min (ref >60)
Glucose, Bld: 183 mg/dL — ABNORMAL HIGH (ref 70–99)
Potassium: 3.6 mmol/L (ref 3.5–5.1)
Sodium: 139 mmol/L (ref 135–145)

## 2019-09-28 LAB — PROCALCITONIN: Procalcitonin: 4.11 ng/mL

## 2019-09-28 LAB — MAGNESIUM: Magnesium: 1.5 mg/dL — ABNORMAL LOW (ref 1.7–2.4)

## 2019-09-28 LAB — STREP PNEUMONIAE URINARY ANTIGEN: Strep Pneumo Urinary Antigen: NEGATIVE

## 2019-09-28 LAB — HEMOGLOBIN A1C
Hgb A1c MFr Bld: 7.2 % — ABNORMAL HIGH (ref 4.8–5.6)
Mean Plasma Glucose: 159.94 mg/dL

## 2019-09-28 MED ORDER — AMLODIPINE BESYLATE 5 MG PO TABS
5.0000 mg | ORAL_TABLET | Freq: Every day | ORAL | Status: DC
  Administered 2019-09-28 – 2019-09-29 (×2): 5 mg via ORAL
  Filled 2019-09-28 (×2): qty 1

## 2019-09-28 MED ORDER — IPRATROPIUM-ALBUTEROL 0.5-2.5 (3) MG/3ML IN SOLN
3.0000 mL | Freq: Three times a day (TID) | RESPIRATORY_TRACT | Status: DC
  Administered 2019-09-29 – 2019-09-30 (×6): 3 mL via RESPIRATORY_TRACT
  Filled 2019-09-28 (×6): qty 3

## 2019-09-28 MED ORDER — MAGNESIUM SULFATE 4 GM/100ML IV SOLN
4.0000 g | Freq: Once | INTRAVENOUS | Status: AC
  Administered 2019-09-28: 4 g via INTRAVENOUS
  Filled 2019-09-28: qty 100

## 2019-09-28 MED ORDER — ENOXAPARIN SODIUM 40 MG/0.4ML ~~LOC~~ SOLN
40.0000 mg | SUBCUTANEOUS | Status: DC
  Administered 2019-09-28 – 2019-10-04 (×7): 40 mg via SUBCUTANEOUS
  Filled 2019-09-28 (×7): qty 0.4

## 2019-09-28 MED ORDER — IPRATROPIUM-ALBUTEROL 0.5-2.5 (3) MG/3ML IN SOLN
3.0000 mL | Freq: Four times a day (QID) | RESPIRATORY_TRACT | Status: DC
  Administered 2019-09-28: 3 mL via RESPIRATORY_TRACT
  Filled 2019-09-28: qty 3

## 2019-09-28 NOTE — Progress Notes (Signed)
Patient also has watch and ring in her possession and what appear to be cough drop is food boxes.

## 2019-09-28 NOTE — Progress Notes (Signed)
Called the patient's son [name] to give updates.  No answer.  Left voicemail message.

## 2019-09-28 NOTE — Progress Notes (Signed)
Patient admitted to room 1412 after midnight from ED. Alert and verbally responsive. Denies any pain. Unable to complete admission at this time because pt speak little [place]. Pt belongings at bedside includes a tablet, book, hearing aid, eye glasses, cough drops, toothbrush and clothing.

## 2019-09-28 NOTE — TOC Progression Note (Signed)
Transition of Care [facility]) - Progression Note    Patient Details  Name: [name] MRN: [id number] Date of Birth: 1923/08/20  Transition of Care [facility]) CM/SW Contact  Destinae Neubecker, Olegario Messier, RN Phone Number: 09/28/2019, 3:54 PM  Clinical Narrative: ALF-[facility] facility has own.      Expected Discharge Plan: Home w Home Health Services Barriers to Discharge: Continued Medical Work up  Expected Discharge Plan and Services Expected Discharge Plan: Home w Home Health Services   Discharge Planning Services: CM Consult   Living arrangements for the past 2 months: Assisted Living Facility                           HH Arranged: PT           Social Determinants of Health (SDOH) Interventions    Readmission Risk Interventions No flowsheet data found.

## 2019-09-28 NOTE — TOC Initial Note (Signed)
Transition of Care [facility]) - Initial/Assessment Note    Patient Details  Name: [name] MRN: [id number] Date of Birth: 1923/05/07  Transition of Care [facility]) CM/SW Contact:    Dessa Phi, RN Phone Number: 09/28/2019, 10:43 AM  Clinical Narrative:Spoke to son Sidd/ALF [facility] rep-d/c plans to return back to [facility].                   Expected Discharge Plan: [place] Barriers to Discharge: Continued Medical Work up   Patient Goals and CMS Choice Patient states their goals for this hospitalization and ongoing recovery are:: return back [facility]F CMS Medicare.gov Compare Post Acute Care list provided to:: Patient Represenative (must comment)    Expected Discharge Plan and Services Expected Discharge Plan: [place]   Discharge Planning Services: CM Consult   Living arrangements for the past 2 months: [place]                                      Prior Living Arrangements/Services Living arrangements for the past 2 months: [place] Lives with:: Facility Resident   Do you feel safe going back to the place where you live?: Yes      Need for Family Participation in Patient Care: No (Comment) Care giver support system in place?: Yes (comment)   Criminal Activity/Legal Involvement Pertinent to Current Situation/Hospitalization: No - Comment as needed  Activities of Daily Living Home Assistive Devices/Equipment: Blood pressure cuff, Eyeglasses, Grab bars in shower, Hand-held shower hose, Grab bars around toilet, Hearing aid, Walker (specify type), Hospital bed, Scales(front wheeled walker, bilateral hearing aids-[facility] has necessary equipment for their residents) ADL Screening (condition at time of admission) Patient's cognitive ability adequate to safely complete daily activities?: Yes Is the patient deaf or have difficulty hearing?: Yes(wears bilateral hearing aids) Does the patient  have difficulty seeing, even when wearing glasses/contacts?: No Does the patient have difficulty concentrating, remembering, or making decisions?: Yes Patient able to express need for assistance with ADLs?: Yes Does the patient have difficulty dressing or bathing?: Yes Independently performs ADLs?: No Communication: Independent Dressing (OT): Needs assistance Is this a change from baseline?: Pre-admission baseline Grooming: Needs assistance Is this a change from baseline?: Pre-admission baseline Feeding: Needs assistance Is this a change from baseline?: Pre-admission baseline Bathing: Needs assistance Is this a change from baseline?: Pre-admission baseline Toileting: Needs assistance Is this a change from baseline?: Pre-admission baseline In/Out Bed: Needs assistance Is this a change from baseline?: Pre-admission baseline Walks in Home: Needs assistance Is this a change from baseline?: Pre-admission baseline Does the patient have difficulty walking or climbing stairs?: Yes(secondary to weakness) Weakness of Legs: Both Weakness of Arms/Hands: None  Permission Sought/Granted Permission sought to share information with : Case Manager Permission granted to share information with : Yes, Verbal Permission Granted  Share Information with NAME: Case Manager  Permission granted to share info w AGENCY: [facility]  Permission granted to share info w Relationship: Sidd(son)[phone]     Emotional Assessment Appearance:: Appears stated age Attitude/Demeanor/Rapport: Gracious Affect (typically observed): Accepting Orientation: : Oriented to Self Alcohol / Substance Use: Not Applicable Psych Involvement: No (comment)  Admission diagnosis:  LLL pneumonia [J18.9] Community acquired pneumonia of left lower lobe of lung [J18.9] Patient Active Problem List   Diagnosis Date Noted  . Severe sepsis ([place])  09/28/2019  . Fever 09/28/2019  . Generalized weakness 09/28/2019  .  Dehydration 09/28/2019  . LLL pneumonia 09/27/2019  . Mild cognitive impairment with memory loss 06/08/2014  . SORE THROAT 04/10/2010  . Dyspnea 10/25/2009  . COUGH 10/25/2009  . HYPERLIPIDEMIA 10/24/2009  . DEPRESSION 10/24/2009  . Essential hypertension 10/24/2009  . CAD 10/24/2009  . ALLERGIC RHINITIS, SEASONAL 10/24/2009  . Asthma 10/24/2009  . OSTEOPENIA 10/24/2009   PCP:  Chilton Greathouse, MD Pharmacy:   [facility] [phone] [employer] - [place], [place] - [address] [place] [address] Phone: [phone] Fax: [phone]  [id number] [place], [place] - [address] AT [facility] [address] & [address] [place] [address] Phone: [phone] Fax: [phone]     Social Determinants of Health (SDOH) Interventions    Readmission Risk Interventions No flowsheet data found.

## 2019-09-28 NOTE — Progress Notes (Signed)
Nursing update given to son Sidd Leamer x2 this AM.  Will continue to monitor.

## 2019-09-28 NOTE — Progress Notes (Addendum)
PROGRESS NOTE  [name] YQI:[id number] DOB: 1922-11-14 DOA: 09/27/2019 PCP: Chilton Greathouse, MD  HPI/Recap of past 24 hours: [name] is a 86 y.o. female with medical history significant for dementia, type 2 diabetes mellitus, ischemic CVA, hypertension, urinary incontinence, asthma, who is admitted to [facility] on 09/27/2019 with severe sepsis due to left lower lobe pneumonia after presenting from SNF to [facility] Emergency Department for evaluation of fever.   09/28/19: Seen and examined.  Alert and confused in a state of dementia.  Assessment/Plan: Principal Problem:   LLL pneumonia Active Problems:   Essential hypertension   Severe sepsis (HCC)   Fever   Generalized weakness   Dehydration   Sepsis 2/2 LLL CAP Reviewed CXR personally infiltrates noted at LLL Continue IV abx empirically Maintain O2 sat>92% Elevated procalcitonin >4 Elevated lactic acid 2.9 Blood cx negative to date, follow cx Add sputum cx  Acute hypoxic respiratory failure 2/2 to CAP No O2 supplementation at baseline Management as above Add bronchodilators, IS flutter valve  Type 2 DM A1C ISS  Hypomagnesemia  Mag 1.5 Replace  Dehydration Hypovolemic on exam C/w IV fluid Monitor UO  Hx ischemic CVA Continue ASA, plavix and crestor  Essential HTN C/w home regimen  Asthma C/ w home regimen  Physical debility/Generalized weakness PT to assess when more stable, likely tomorow Fall precautions  Chronic anxiety/depression Continue home meds   DVT prophylaxis: SCDs, sq lovenox daily Code Status: Full code Family Communication:None at bedside  Disposition Plan: From SNF; anticipate dc to SNF once respiratory status close to baseline. Barrier to dc ongoing tx for CAP with IV abx.   Consults called: None      Objective: Vitals:   09/27/19 2345 09/28/19 0038 09/28/19 0942 09/28/19 1000  BP:  (!) 159/74 (!) 152/64   Pulse: 92 98 89   Resp:  (!) 28 18 20    Temp:  99.9 F (37.7 C) 98.2 F (36.8 C)   TempSrc:  Oral Oral   SpO2: 99% 97% 93% 97%    Intake/Output Summary (Last 24 hours) at 09/28/2019 1516 Last data filed at 09/28/2019 11/26/2019 Gross per 24 hour  Intake 2587.19 ml  Output -  Net 2587.19 ml   There were no vitals filed for this visit.  Exam:  . General: 86 y.o. year-old female well developed well nourished in no acute distress. Alert. . Cardiovascular: Regular rate and rhythm with no rubs or gallops.  No thyromegaly or JVD noted.   97 Respiratory:Diffused rales.  No wheezing. . Abdomen: Soft nontender nondistended with normal bowel sounds x4 quadrants. . Musculoskeletal: No lower extremity edema. Marland Kitchen Psychiatry: Mood is appropriate for condition and setting   Data Reviewed: CBC: Recent Labs  Lab 09/27/19 1718 09/28/19 0520  WBC 14.7* 14.2*  NEUTROABS 13.4* 12.9*  HGB 13.3 13.0  HCT 40.7 40.2  MCV 90.0 91.4  PLT 277 252   Basic Metabolic Panel: Recent Labs  Lab 09/27/19 1718 09/28/19 0520  NA 134* 139  K 3.8 3.6  CL 98 105  CO2 27 23  GLUCOSE 308* 183*  BUN 17 16  CREATININE 0.82 0.61  CALCIUM 8.9 8.2*  MG 1.7 1.5*   GFR: CrCl cannot be calculated (Unknown ideal weight.). Liver Function Tests: Recent Labs  Lab 09/27/19 1718  AST 20  ALT 16  ALKPHOS 48  BILITOT 1.1  PROT 6.8  ALBUMIN 3.6   No results for input(s): LIPASE, AMYLASE in the last 168 hours. No  results for input(s): AMMONIA in the last 168 hours. Coagulation Profile: Recent Labs  Lab 09/27/19 1718  INR 1.1   Cardiac Enzymes: No results for input(s): CKTOTAL, CKMB, CKMBINDEX, TROPONINI in the last 168 hours. BNP (last 3 results) No results for input(s): PROBNP in the last 8760 hours. HbA1C: Recent Labs    09/28/19 0520  HGBA1C 7.2*   CBG: Recent Labs  Lab 09/28/19 0734 09/28/19 1110  GLUCAP 149* 149*   Lipid Profile: No results for input(s): CHOL, HDL, LDLCALC, TRIG, CHOLHDL, LDLDIRECT in the last 72  hours. Thyroid Function Tests: No results for input(s): TSH, T4TOTAL, FREET4, T3FREE, THYROIDAB in the last 72 hours. Anemia Panel: No results for input(s): VITAMINB12, FOLATE, FERRITIN, TIBC, IRON, RETICCTPCT in the last 72 hours. Urine analysis:    Component Value Date/Time   COLORURINE YELLOW 09/27/2019 1924   APPEARANCEUR CLEAR 09/27/2019 1924   LABSPEC 1.017 09/27/2019 1924   PHURINE 6.0 09/27/2019 1924   GLUCOSEU >=500 (A) 09/27/2019 1924   HGBUR NEGATIVE 09/27/2019 [place] NEGATIVE 09/27/2019 1924   KETONESUR 5 (A) 09/27/2019 1924   PROTEINUR 30 (A) 09/27/2019 1924   NITRITE NEGATIVE 09/27/2019 1924   LEUKOCYTESUR NEGATIVE 09/27/2019 1924   Sepsis Labs: @LABRCNTIP (procalcitonin:4,lacticidven:4)  ) Recent Results (from the past 240 hour(s))  Blood Culture (routine x 2)     Status: None (Preliminary result)   Collection Time: 09/27/19  5:18 PM   Specimen: BLOOD  Result Value Ref Range Status   Specimen Description   Final    BLOOD LEFT ANTECUBITAL Performed at [facility] [address], [place], [place] [address]    Special Requests   Final    BOTTLES DRAWN AEROBIC AND ANAEROBIC Blood Culture results may not be optimal due to an excessive volume of blood received in culture bottles Performed at [facility] [address] [place], [place] [address]    Culture   Final    NO GROWTH < 24 HOURS Performed at [facility] [address], [place], [place] [address]    Report Status PENDING  Incomplete  Respiratory Panel by RT PCR (Flu A&B, Covid) - Nasopharyngeal Swab     Status: None   Collection Time: 09/27/19  5:23 PM   Specimen: Nasopharyngeal Swab  Result Value Ref Range Status   SARS Coronavirus 2 by RT PCR NEGATIVE NEGATIVE Final    Comment: (NOTE) SARS-CoV-2 target nucleic acids are NOT DETECTED. The SARS-CoV-2 RNA is generally detectable in upper respiratoy specimens during the acute phase of  infection. The lowest concentration of SARS-CoV-2 viral copies this assay can detect is 131 copies/mL. A negative result does not preclude SARS-Cov-2 infection and should not be used as the sole basis for treatment or other patient management decisions. A negative result may occur with  improper specimen collection/handling, submission of specimen other than nasopharyngeal swab, presence of viral mutation(s) within the areas targeted by this assay, and inadequate number of viral copies (<131 copies/mL). A negative result must be combined with clinical observations, patient history, and epidemiological information. The expected result is Negative. Fact Sheet for Patients:  PinkCheek.be Fact Sheet for Healthcare Providers:  GravelBags.it This test is not yet ap proved or cleared by the [place] FDA and  has been authorized for detection and/or diagnosis of SARS-CoV-2 by FDA under an Emergency Use Authorization (EUA). This EUA will remain  in effect (meaning this test can be used) for the duration of the COVID-19 declaration under  Section 564(b)(1) of the Act, 21 U.S.C. section 360bbb-3(b)(1), unless the authorization is terminated or revoked sooner.    Influenza A by PCR NEGATIVE NEGATIVE Final   Influenza B by PCR NEGATIVE NEGATIVE Final    Comment: (NOTE) The Xpert Xpress SARS-CoV-2/FLU/RSV assay is intended as an aid in  the diagnosis of influenza from Nasopharyngeal swab specimens and  should not be used as a sole basis for treatment. Nasal washings and  aspirates are unacceptable for Xpert Xpress SARS-CoV-2/FLU/RSV  testing. Fact Sheet for Patients: https://www.moore.com/ Fact Sheet for Healthcare Providers: https://www.young.biz/ This test is not yet approved or cleared by the [place] FDA and  has been authorized for detection and/or diagnosis of SARS-CoV-2 by  FDA under  an Emergency Use Authorization (EUA). This EUA will remain  in effect (meaning this test can be used) for the duration of the  Covid-19 declaration under Section 564(b)(1) of the Act, 21  U.S.C. section 360bbb-3(b)(1), unless the authorization is  terminated or revoked. Performed at [facility], [address], [place], [place] [address]   Blood Culture (routine x 2)     Status: None (Preliminary result)   Collection Time: 09/27/19  5:33 PM   Specimen: BLOOD  Result Value Ref Range Status   Specimen Description   Final    BLOOD RIGHT ANTECUBITAL Performed at [facility], [address] [place], [place] [address]    Special Requests   Final    BOTTLES DRAWN AEROBIC AND ANAEROBIC Blood Culture adequate volume Performed at [facility], [address], [place], [place] [address]    Culture   Final    NO GROWTH < 24 HOURS Performed at [facility], [address] [place], [place] [address]    Report Status PENDING  Incomplete      Studies: DG Chest Port 1 View  Result Date: 09/27/2019 CLINICAL DATA:  Sepsis. EXAM: PORTABLE CHEST 1 VIEW COMPARISON:  July 29, 2016. FINDINGS: Stable cardiomediastinal silhouette. Atherosclerosis of thoracic aorta is noted. No pneumothorax or pleural effusion is noted. Right lung is clear. Mild left basilar atelectasis or infiltrate is noted. Bony thorax is unremarkable. IMPRESSION: Aortic atherosclerosis. Mild left basilar atelectasis or infiltrate is noted. Aortic Atherosclerosis (ICD10-I70.0). Electronically Signed   By: Lupita Raider M.D.   On: 09/27/2019 18:29    Scheduled Meds: . aspirin  81 mg Oral Daily  . clopidogrel  75 mg Oral Q breakfast  . docusate sodium  100 mg Oral Daily  . donepezil  10 mg Oral QHS  . escitalopram  5 mg Oral Daily  . insulin aspart  0-9 Units Subcutaneous TID WC  . metoprolol succinate  25 mg Oral Daily  . mometasone-formoterol  2 puff Inhalation  BID  . rosuvastatin  5 mg Oral q1800  . sodium chloride flush  3 mL Intravenous Q12H    Continuous Infusions: . azithromycin    . cefTRIAXone (ROCEPHIN)  IV       LOS: 1 day     Darlin Drop, MD [facility] Pager [phone]  If 7PM-7AM, please contact night-coverage [email or web address] Password [facility] 09/28/2019, 3:16 PM

## 2019-09-28 NOTE — Evaluation (Signed)
Physical Therapy Evaluation Patient Details Name: [name] MRN: [id number] DOB: 10/23/1922 Today's Date: 09/28/2019   History of Present Illness  86 yo female admitted with Pna, weakness, sepsis. Hx of dementia, CVA, Dm, asthma, incontinence  Clinical Impression  On eval, pt required Min assist for mobility. She was able to stand and pivot, bed<>bsc. Pt is unsteady. A bit impulsive but she follows commands well. No family present during session. Per chart, pt is from an ALF. She should be able to return as long as facility can provide current level of care. Will follow during hospital stay.     Follow Up Recommendations Home health PT    Equipment Recommendations  None recommended by PT    Recommendations for Other Services       Precautions / Restrictions Precautions Precautions: Fall Restrictions Weight Bearing Restrictions: No      Mobility  Bed Mobility Overal bed mobility: Needs Assistance Bed Mobility: Supine to Sit;Sit to Supine     Supine to sit: Min assist;HOB elevated Sit to supine: Min guard   General bed mobility comments: Assist to get to EOB. Increased time. Less assist for pt to get back into bed. Cues for safety, lines.  Transfers Overall transfer level: Needs assistance Equipment used: 1 person hand held assist Transfers: Sit to/from [employer] Sit to Stand: Min assist Stand pivot transfers: Min assist       General transfer comment: Assist to rise, steady, control descent. Stand pivot x 2, bed<>bsc with 1 HHA/pt holding on to armrest or bedrail.  Ambulation/Gait                Stairs            Wheelchair Mobility    Modified Rankin (Stroke Patients Only)       Balance Overall balance assessment: Needs assistance         Standing balance support: Single extremity supported Standing balance-Leahy Scale: Poor                               Pertinent Vitals/Pain Pain Assessment:  Faces Faces Pain Scale: No hurt    Home Living Family/patient expects to be discharged to:: Assisted living       Home Access: Level entry     Home Layout: One level        Prior Function Level of Independence: Needs assistance               Hand Dominance        Extremity/Trunk Assessment   Upper Extremity Assessment Upper Extremity Assessment: Generalized weakness    Lower Extremity Assessment Lower Extremity Assessment: Generalized weakness    Cervical / Trunk Assessment Cervical / Trunk Assessment: Kyphotic  Communication   Communication: Prefers language other than English  Cognition Arousal/Alertness: Awake/alert Behavior During Therapy: WFL for tasks assessed/performed Overall Cognitive Status: History of cognitive impairments - at baseline                                        General Comments      Exercises     Assessment/Plan    PT Assessment Patient needs continued PT services  PT Problem List Decreased strength;Decreased mobility;Decreased balance;Decreased activity tolerance;Decreased knowledge of use of DME;Decreased cognition;Decreased safety awareness       PT Treatment Interventions  DME instruction;Gait training;Therapeutic activities;Therapeutic exercise;Patient/family education;Balance training;Functional mobility training    PT Goals (Current goals can be found in the Care Plan section)  Acute Rehab PT Goals Patient Stated Goal: none stated PT Goal Formulation: With patient Time For Goal Achievement: 10/12/19 Potential to Achieve Goals: Fair    Frequency Min 3X/week   Barriers to discharge        Co-evaluation               AM-PAC PT "6 Clicks" Mobility  Outcome Measure Help needed turning from your back to your side while in a flat bed without using bedrails?: A Little Help needed moving from lying on your back to sitting on the side of a flat bed without using bedrails?: A Little Help needed  moving to and from a bed to a chair (including a wheelchair)?: A Little Help needed standing up from a chair using your arms (e.g., wheelchair or bedside chair)?: A Little Help needed to walk in hospital room?: A Little Help needed climbing 3-5 steps with a railing? : A Lot 6 Click Score: 17    End of Session   Activity Tolerance: Patient tolerated treatment well Patient left: in bed;with call bell/phone within reach;with bed alarm set   PT Visit Diagnosis: Muscle weakness (generalized) (M62.81);Difficulty in walking, not elsewhere classified (R26.2)    Time: 5465-6812 PT Time Calculation (min) (ACUTE ONLY): 22 min   Charges:   PT Evaluation $PT Eval Moderate Complexity: 1 Mod           Landri Dorsainvil P, PT Acute Rehabilitation

## 2019-09-28 NOTE — Progress Notes (Signed)
RT Note: PRN Flutter not started/indicated, pt. speaks/understands little English, b/l b.s. few scattered crackles, ABG obtained, remains on 2 lpm n/c.

## 2019-09-28 NOTE — [place] FL2 (Signed)
[place] MEDICAID FL2 LEVEL OF CARE SCREENING TOOL     IDENTIFICATION  Patient Name: [name] Birthdate: October 07, 1922 Sex: female Admission Date (Current Location): 09/27/2019  [facility] [place] Number:  Herbalist and Address:  [facility],  [place], [place]      Provider Number: [id number]  Attending Physician Name and Address:  Kayleen Memos, DO  Relative Name and Phone Number:  Sidd Leeman [phone]    Current Level of Care: Hospital Recommended Level of Care: [place] Prior Approval Number:    Date Approved/Denied:   PASRR Number:    Discharge Plan: Other (Comment)([facility])    Current Diagnoses: Patient Active Problem List   Diagnosis Date Noted  . Severe sepsis ([place]) 09/28/2019  . Fever 09/28/2019  . Generalized weakness 09/28/2019  . Dehydration 09/28/2019  . LLL pneumonia 09/27/2019  . Mild cognitive impairment with memory loss 06/08/2014  . SORE THROAT 04/10/2010  . Dyspnea 10/25/2009  . COUGH 10/25/2009  . HYPERLIPIDEMIA 10/24/2009  . DEPRESSION 10/24/2009  . Essential hypertension 10/24/2009  . CAD 10/24/2009  . ALLERGIC RHINITIS, SEASONAL 10/24/2009  . Asthma 10/24/2009  . OSTEOPENIA 10/24/2009    Orientation RESPIRATION BLADDER Height & Weight     Self  Normal Continent Weight:   Height:     BEHAVIORAL SYMPTOMS/MOOD NEUROLOGICAL BOWEL NUTRITION STATUS      Continent Diet(CHO MOD)  AMBULATORY STATUS COMMUNICATION OF NEEDS Skin   Limited Assist Verbally(little Engish understanding) Normal                       Personal Care Assistance Level of Assistance  Bathing, Feeding, Dressing Bathing Assistance: Limited assistance Feeding assistance: Limited assistance Dressing Assistance: Limited assistance     Functional Limitations Info  Sight, Hearing, Speech Sight Info: Adequate Hearing Info: Adequate Speech Info: Adequate    SPECIAL CARE FACTORS  FREQUENCY  PT (By licensed PT), OT (By licensed OT)     PT Frequency: 5x week OT Frequency: 5x week            Contractures Contractures Info: Not present    Additional Factors Info  Code Status, Allergies Code Status Info: Full code Allergies Info: Sulfonamide Derivatives           Current Medications (09/28/2019):  This is the current hospital active medication list Current Facility-Administered Medications  Medication Dose Route Frequency Provider Last Rate Last Admin  . acetaminophen (TYLENOL) tablet 650 mg  650 mg Oral Q6H PRN Howerter, Justin B, DO   650 mg at 09/28/19 0901   Or  . acetaminophen (TYLENOL) suppository 650 mg  650 mg Rectal Q6H PRN Howerter, Justin B, DO      . albuterol (PROVENTIL) (2.5 MG/3ML) 0.083% nebulizer solution 2.5 mg  2.5 mg Inhalation Q4H PRN Howerter, Justin B, DO      . aspirin chewable tablet 81 mg  81 mg Oral Daily Howerter, Justin B, DO   81 mg at 09/28/19 0901  . azithromycin (ZITHROMAX) 500 mg in sodium chloride 0.9 % 250 mL IVPB  500 mg Intravenous Q24H Howerter, Justin B, DO      . cefTRIAXone (ROCEPHIN) 1 g in sodium chloride 0.9 % 100 mL IVPB  1 g Intravenous Q24H Howerter, Justin B, DO      . clopidogrel (PLAVIX) tablet 75 mg  75 mg Oral Q breakfast Howerter, Justin B, DO   75 mg at 09/28/19  0900  . docusate sodium (COLACE) capsule 100 mg  100 mg Oral Daily Howerter, Justin B, DO   100 mg at 09/28/19 0901  . donepezil (ARICEPT) tablet 10 mg  10 mg Oral QHS Howerter, Justin B, DO   10 mg at 09/28/19 0118  . escitalopram (LEXAPRO) tablet 5 mg  5 mg Oral Daily Howerter, Justin B, DO   5 mg at 09/28/19 0901  . insulin aspart (novoLOG) injection 0-9 Units  0-9 Units Subcutaneous TID WC Howerter, Justin B, DO      . metoprolol succinate (TOPROL-XL) 24 hr tablet 25 mg  25 mg Oral Daily Howerter, Justin B, DO   25 mg at 09/28/19 0901  . mometasone-formoterol (DULERA) 100-5 MCG/ACT inhaler 2 puff  2 puff Inhalation BID Howerter, Justin B, DO       . rosuvastatin (CRESTOR) tablet 5 mg  5 mg Oral q1800 Howerter, Justin B, DO      . sodium chloride flush (NS) 0.9 % injection 3 mL  3 mL Intravenous Q12H Howerter, Justin B, DO   3 mL at 09/28/19 0117     Discharge Medications: Please see discharge summary for a list of discharge medications.  Relevant Imaging Results:  Relevant Lab Results:   Additional Information ss[address][id number], [place], [place]

## 2019-09-29 LAB — BASIC METABOLIC PANEL
Anion gap: 6 (ref 5–15)
BUN: 22 mg/dL (ref 8–23)
CO2: 26 mmol/L (ref 22–32)
Calcium: 8.2 mg/dL — ABNORMAL LOW (ref 8.9–10.3)
Chloride: 106 mmol/L (ref 98–111)
Creatinine, Ser: 0.53 mg/dL (ref 0.44–1.00)
GFR calc Af Amer: >60 mL/min (ref >60)
GFR calc non Af Amer: >60 mL/min (ref >60)
Glucose, Bld: 142 mg/dL — ABNORMAL HIGH (ref 70–99)
Potassium: 3.5 mmol/L (ref 3.5–5.1)
Sodium: 138 mmol/L (ref 135–145)

## 2019-09-29 LAB — CBC WITH DIFFERENTIAL/PLATELET
Abs Immature Granulocytes: 0.35 10*3/uL — ABNORMAL HIGH (ref 0.00–0.07)
Basophils Absolute: 0.1 10*3/uL (ref 0.0–0.1)
Basophils Relative: 1 %
Eosinophils Absolute: 0 10*3/uL (ref 0.0–0.5)
Eosinophils Relative: 0 %
HCT: 36.8 % (ref 36.0–46.0)
Hemoglobin: 11.9 g/dL — ABNORMAL LOW (ref 12.0–15.0)
Immature Granulocytes: 2 %
Lymphocytes Relative: 6 %
Lymphs Abs: 1 10*3/uL (ref 0.7–4.0)
MCH: 29.4 pg (ref 26.0–34.0)
MCHC: 32.3 g/dL (ref 30.0–36.0)
MCV: 90.9 fL (ref 80.0–100.0)
Monocytes Absolute: 1 10*3/uL (ref 0.1–1.0)
Monocytes Relative: 6 %
Neutro Abs: 14.8 10*3/uL — ABNORMAL HIGH (ref 1.7–7.7)
Neutrophils Relative %: 85 %
Platelets: 238 10*3/uL (ref 150–400)
RBC: 4.05 MIL/uL (ref 3.87–5.11)
RDW: 14.6 % (ref 11.5–15.5)
WBC: 17.2 10*3/uL — ABNORMAL HIGH (ref 4.0–10.5)
nRBC: 0 % (ref 0.0–0.2)

## 2019-09-29 LAB — GLUCOSE, CAPILLARY
Glucose-Capillary: 129 mg/dL — ABNORMAL HIGH (ref 70–99)
Glucose-Capillary: 184 mg/dL — ABNORMAL HIGH (ref 70–99)
Glucose-Capillary: 118 mg/dL — ABNORMAL HIGH (ref 70–99)
Glucose-Capillary: 220 mg/dL — ABNORMAL HIGH (ref 70–99)

## 2019-09-29 LAB — MAGNESIUM: Magnesium: 2.4 mg/dL (ref 1.7–2.4)

## 2019-09-29 LAB — PHOSPHORUS: Phosphorus: 1.7 mg/dL — ABNORMAL LOW (ref 2.5–4.6)

## 2019-09-29 MED ORDER — AMLODIPINE BESYLATE 5 MG PO TABS
5.0000 mg | ORAL_TABLET | Freq: Once | ORAL | Status: AC
  Administered 2019-09-29: 14:00:00 5 mg via ORAL
  Filled 2019-09-29: qty 1

## 2019-09-29 MED ORDER — AMLODIPINE BESYLATE 10 MG PO TABS: 10.0000 mg | ORAL_TABLET | Freq: Every day | ORAL | Status: DC

## 2019-09-29 MED ORDER — SODIUM PHOSPHATES 45 MMOLE/15ML IV SOLN
20.0000 mmol | Freq: Once | INTRAVENOUS | Status: AC
  Administered 2019-09-29: 20 mmol via INTRAVENOUS
  Filled 2019-09-29 (×2): qty 6.67

## 2019-09-29 MED ORDER — LACTATED RINGERS IV SOLN: INTRAVENOUS | Status: DC

## 2019-09-29 NOTE — Progress Notes (Signed)
Physical Therapy Treatment Patient Details Name: [name] MRN: [id number] DOB: May 21, 1923 Today's Date: 09/29/2019    History of Present Illness 86 yo female admitted with Pna, weakness, sepsis. Hx of dementia, CVA, Dm, asthma, incontinence    PT Comments    Patient resting in bed at PT arrival and alert and agreeable to therapy. Pt required min assist for bed mob and to perform stand step with 1HHA to [facility]. Pt with good safety awareness for hand placement on RW for sit<>stand transfers and for gait. Pt able to ambulate short distance in room to recliner with Min assist to steady and manage RW. She will continue to benefit from skilled PT interventions and return to ALF with HHPT to further progress mobility.   Follow Up Recommendations  Home health PT     Equipment Recommendations  None recommended by PT    Recommendations for Other Services       Precautions / Restrictions Precautions Precautions: Fall Restrictions Weight Bearing Restrictions: No    Mobility  Bed Mobility Overal bed mobility: Needs Assistance Bed Mobility: Supine to Sit     Supine to sit: Min assist;HOB elevated     General bed mobility comments: verbal/tactile cues for sequencing reaching UE's and assist to bring LE's off EOB and press up to raise trunk and sit up to EOB.  Transfers Overall transfer level: Needs assistance Equipment used: 1 person hand held assist;Rolling walker (2 wheeled) Transfers: Sit to/from [employer] Sit to Stand: Min assist Stand pivot transfers: Min assist       General transfer comment: min assist for power up and to steady with rising to RW from EOB and BSC. 1HHA to stand step/pivot to [facility]. pt taking small steps with poor foot clearance.  Ambulation/Gait Ambulation/Gait assistance: Min assist Gait Distance (Feet): 12 Feet Assistive device: Rolling walker (2 wheeled) Gait Pattern/deviations: Step-to pattern;Decreased stride length;Trunk  flexed;Shuffle Gait velocity: slow   General Gait Details: pt with safe hand placement on RW. cues for safe step pattern and to maintain safe proximity to RW throughout and for pt to look up for improved posture. Min assist to steady. pt abel to sequence turn and take backwards steps towards chair with assist to manage RW.   Stairs             Wheelchair Mobility    Modified Rankin (Stroke Patients Only)       Balance Overall balance assessment: Needs assistance Sitting-balance support: Feet supported Sitting balance-Leahy Scale: Fair     Standing balance support: Single extremity supported;Bilateral upper extremity supported;During functional activity Standing balance-Leahy Scale: Poor           Cognition Arousal/Alertness: Awake/alert Behavior During Therapy: WFL for tasks assessed/performed Overall Cognitive Status: History of cognitive impairments - at baseline          General Comments: pt pleasantly confused. 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      Exercises      General Comments        Pertinent Vitals/Pain Pain Assessment: Faces Faces Pain Scale: No hurt           PT Goals (current goals can now be found in the care plan section) Acute Rehab PT Goals Patient Stated Goal: none stated PT Goal Formulation: With patient Time For Goal Achievement: 10/12/19 Potential to Achieve Goals: Fair Progress towards PT goals:  Progressing toward goals    Frequency    Min 3X/week      PT Plan Current plan remains appropriate       AM-PAC PT "6 Clicks" Mobility   Outcome Measure  Help needed turning from your back to your side while in a flat bed without using bedrails?: A Little Help needed moving from lying on your back to sitting on the side of a flat bed without using bedrails?: A Little Help needed moving to and from a bed to a chair (including a  wheelchair)?: A Little Help needed standing up from a chair using your arms (e.g., wheelchair or bedside chair)?: A Little Help needed to walk in hospital room?: A Little Help needed climbing 3-5 steps with a railing? : A Lot 6 Click Score: 17    End of Session Equipment Utilized During Treatment: Gait belt Activity Tolerance: Patient tolerated treatment well Patient left: in bed;with call bell/phone within reach;with bed alarm set Nurse Communication: Mobility status PT Visit Diagnosis: Muscle weakness (generalized) (M62.81);Difficulty in walking, not elsewhere classified (R26.2)     Time: 3893-7342 PT Time Calculation (min) (ACUTE ONLY): 25 min  Charges:  $Gait Training: 8-22 mins $Therapeutic Activity: 8-22 mins                    Verner Mould, DPT Physical Therapist with [facility] [phone]  09/29/2019 4:55 PM

## 2019-09-29 NOTE — Progress Notes (Signed)
PROGRESS NOTE  [name] OVF:[id number] DOB: February 17, 1923 DOA: 09/27/2019 PCP: Chilton Greathouse, MD  HPI/Recap of past 24 hours: [name] is a 86 y.o. female with medical history significant for dementia, type 2 diabetes mellitus, ischemic CVA, hypertension, urinary incontinence, asthma, who is admitted to [facility] on 09/27/2019 with severe sepsis due to left lower lobe pneumonia after presenting from SNF to [facility] Emergency Department for evaluation of fever.   09/29/19: Seen and examined.  More alert and interactive today.  She denies any pain and has no new complaints.   Updated the patient's son Mr. Ash via phone.  He requested that accomodation be made for his mother to receive her second dose of COVID-19 vaccine.  Unfortunately not available at our facility.  Discussed with our [facility] Mr. Rocco Pauls and ID Dr. Ninetta Lights.  Per our [facility] on 09/29/19: Was made aware that there are regulations on state level as to how many times we are allowd to move the vaccine in order to reduce the risk of warming and loosing doses that were not administered in the appropriate time. this would restrict [place] from transporting doses back to the hopital because they are not stored here.     Assessment/Plan: Principal Problem:   LLL pneumonia Active Problems:   Essential hypertension   Severe sepsis (HCC)   Fever   Generalized weakness   Dehydration   Sepsis 2/2 LLL CAP Improving clinically. Reviewed CXR personally infiltrates noted at LLL Continue IV abx empirically IV azithromycin and Rocephin day #2. Continue to maintain O2 sat>92% Elevated procalcitonin >4 Elevated lactic acid 2.9 Blood cx negative to date, follow cx Obtain procalcitonin in the morning  Acute hypoxic respiratory failure 2/2 to CAP No O2 supplementation at baseline Management as above Continue bronchodilators, IS flutter valve  Type 2 DM A1C 7.2 on 09/28/2019. Continue ISS  Resolved  post repletion: Hypomagnesemia  Mag 1.5>> 2.4  Improving with IV fluid hydration: Dehydration Hypovolemic on exam C/w IV fluid Continue to monitor UO  Hx ischemic CVA Continue ASA, plavix and crestor  Essential HTN Continue Norvasc 10 mg daily  Asthma C/ w home regimen  Physical debility/Generalized weakness PT to assess when more stable, likely tomorow Fall precautions  Chronic anxiety/depression Continue home meds   DVT prophylaxis: SCDs, sq lovenox daily Code Status: Full code Family Communication:None at bedside  Disposition Plan: From SNF; anticipate dc to SNF once respiratory status close to baseline likely in the next 24 to 48 hours. Barrier to dc ongoing tx for CAP with IV abx.   Consults called: None      Objective: Vitals:   09/29/19 0743 09/29/19 1317 09/29/19 1353 09/29/19 1425  BP:  (!) 159/74 (!) 160/70   Pulse:  87    Resp:  (!) 25    Temp:  99 F (37.2 C)    TempSrc:  Oral    SpO2: 94% 94%  91%  Weight:      Height:        Intake/Output Summary (Last 24 hours) at 09/29/2019 1630 Last data filed at 09/29/2019 0516 Gross per 24 hour  Intake 547.89 ml  Output 150 ml  Net 397.89 ml   Filed Weights   09/28/19 1654 09/29/19 0514  Weight: 55.7 kg 59.8 kg    Exam:  . General: 86 y.o. year-old female well-developed and nourished no acute distress.  Alert and interactive.  Pleasant and talkative.   . Cardiovascular: Regular rate and  rhythm no rubs or gallops. Marland Kitchen Respiratory: Mild rales at bases.  No wheezing noted.  Good respiratory effort. . Abdomen: Soft nontender normal bowel sounds present.   . Musculoskeletal: Trace lower extremity edema bilaterally.   Psychiatry: Mood is appropriate for condition and setting.  Data Reviewed: CBC: Recent Labs  Lab 09/27/19 1718 09/28/19 0520 09/29/19 0507  WBC 14.7* 14.2* 17.2*  NEUTROABS 13.4* 12.9* 14.8*  HGB 13.3 13.0 11.9*  HCT 40.7 40.2 36.8  MCV 90.0 91.4 90.9  PLT 277 252 619   Basic  Metabolic Panel: Recent Labs  Lab 09/27/19 1718 09/28/19 0520 09/29/19 0507  NA 134* 139 138  K 3.8 3.6 3.5  CL 98 105 106  CO2 27 23 26   GLUCOSE 308* 183* 142*  BUN 17 16 22   CREATININE 0.82 0.61 0.53  CALCIUM 8.9 8.2* 8.2*  MG 1.7 1.5* 2.4  PHOS  --   --  1.7*   GFR: Estimated Creatinine Clearance: 35.6 mL/min (by C-G formula based on SCr of 0.53 mg/dL). Liver Function Tests: Recent Labs  Lab 09/27/19 1718  AST 20  ALT 16  ALKPHOS 48  BILITOT 1.1  PROT 6.8  ALBUMIN 3.6   No results for input(s): LIPASE, AMYLASE in the last 168 hours. No results for input(s): AMMONIA in the last 168 hours. Coagulation Profile: Recent Labs  Lab 09/27/19 1718  INR 1.1   Cardiac Enzymes: No results for input(s): CKTOTAL, CKMB, CKMBINDEX, TROPONINI in the last 168 hours. BNP (last 3 results) No results for input(s): PROBNP in the last 8760 hours. HbA1C: Recent Labs    09/28/19 0520  HGBA1C 7.2*   CBG: Recent Labs  Lab 09/28/19 1713 09/28/19 2113 09/29/19 0727 09/29/19 1112 09/29/19 1605  GLUCAP 164* 171* 129* 184* 118*   Lipid Profile: No results for input(s): CHOL, HDL, LDLCALC, TRIG, CHOLHDL, LDLDIRECT in the last 72 hours. Thyroid Function Tests: No results for input(s): TSH, T4TOTAL, FREET4, T3FREE, THYROIDAB in the last 72 hours. Anemia Panel: No results for input(s): VITAMINB12, FOLATE, FERRITIN, TIBC, IRON, RETICCTPCT in the last 72 hours. Urine analysis:    Component Value Date/Time   COLORURINE YELLOW 09/27/2019 1924   APPEARANCEUR CLEAR 09/27/2019 1924   LABSPEC 1.017 09/27/2019 1924   PHURINE 6.0 09/27/2019 1924   GLUCOSEU >=500 (A) 09/27/2019 1924   HGBUR NEGATIVE 09/27/2019 [place] NEGATIVE 09/27/2019 1924   KETONESUR 5 (A) 09/27/2019 1924   PROTEINUR 30 (A) 09/27/2019 1924   NITRITE NEGATIVE 09/27/2019 1924   LEUKOCYTESUR NEGATIVE 09/27/2019 1924   Sepsis Labs: @LABRCNTIP (procalcitonin:4,lacticidven:4)  ) Recent Results (from the  past 240 hour(s))  Blood Culture (routine x 2)     Status: None (Preliminary result)   Collection Time: 09/27/19  5:18 PM   Specimen: BLOOD  Result Value Ref Range Status   Specimen Description   Final    BLOOD LEFT ANTECUBITAL Performed at [facility] [address], [place], [place] [address]    Special Requests   Final    BOTTLES DRAWN AEROBIC AND ANAEROBIC Blood Culture results may not be optimal due to an excessive volume of blood received in culture bottles Performed at [facility] [address], [place], [place] [address]    Culture   Final    NO GROWTH 2 DAYS Performed at [facility] [address] [place], [place] [address]    Report Status PENDING  Incomplete  Respiratory Panel by RT PCR (Flu A&B, Covid) - Nasopharyngeal  Swab     Status: None   Collection Time: 09/27/19  5:23 PM   Specimen: Nasopharyngeal Swab  Result Value Ref Range Status   SARS Coronavirus 2 by RT PCR NEGATIVE NEGATIVE Final    Comment: (NOTE) SARS-CoV-2 target nucleic acids are NOT DETECTED. The SARS-CoV-2 RNA is generally detectable in upper respiratoy specimens during the acute phase of infection. The lowest concentration of SARS-CoV-2 viral copies this assay can detect is 131 copies/mL. A negative result does not preclude SARS-Cov-2 infection and should not be used as the sole basis for treatment or other patient management decisions. A negative result may occur with  improper specimen collection/handling, submission of specimen other than nasopharyngeal swab, presence of viral mutation(s) within the areas targeted by this assay, and inadequate number of viral copies (<131 copies/mL). A negative result must be combined with clinical observations, patient history, and epidemiological information. The expected result is Negative. Fact Sheet for Patients:  https://www.moore.com/ Fact Sheet for Healthcare Providers:    https://www.young.biz/ This test is not yet ap proved or cleared by the [place] FDA and  has been authorized for detection and/or diagnosis of SARS-CoV-2 by FDA under an Emergency Use Authorization (EUA). This EUA will remain  in effect (meaning this test can be used) for the duration of the COVID-19 declaration under Section 564(b)(1) of the Act, 21 U.S.C. section 360bbb-3(b)(1), unless the authorization is terminated or revoked sooner.    Influenza A by PCR NEGATIVE NEGATIVE Final   Influenza B by PCR NEGATIVE NEGATIVE Final    Comment: (NOTE) The Xpert Xpress SARS-CoV-2/FLU/RSV assay is intended as an aid in  the diagnosis of influenza from Nasopharyngeal swab specimens and  should not be used as a sole basis for treatment. Nasal washings and  aspirates are unacceptable for Xpert Xpress SARS-CoV-2/FLU/RSV  testing. Fact Sheet for Patients: https://www.moore.com/ Fact Sheet for Healthcare Providers: https://www.young.biz/ This test is not yet approved or cleared by the [place] FDA and  has been authorized for detection and/or diagnosis of SARS-CoV-2 by  FDA under an Emergency Use Authorization (EUA). This EUA will remain  in effect (meaning this test can be used) for the duration of the  Covid-19 declaration under Section 564(b)(1) of the Act, 21  U.S.C. section 360bbb-3(b)(1), unless the authorization is  terminated or revoked. Performed at [facility], [address] [place], [place] [address]   Blood Culture (routine x 2)     Status: None (Preliminary result)   Collection Time: 09/27/19  5:33 PM   Specimen: BLOOD  Result Value Ref Range Status   Specimen Description   Final    BLOOD RIGHT ANTECUBITAL Performed at [facility], [address], [place], [place] [address]    Special Requests   Final    BOTTLES DRAWN AEROBIC AND ANAEROBIC Blood Culture adequate  volume Performed at [facility], [address], [place], [place] [address]    Culture   Final    NO GROWTH 2 DAYS Performed at [facility], [address], [place], [place] [address]    Report Status PENDING  Incomplete  Urine culture     Status: None   Collection Time: 09/27/19  7:24 PM   Specimen: In/Out Cath Urine  Result Value Ref Range Status   Specimen Description   Final    IN/OUT CATH URINE Performed at [facility], [address] [place], [place] [address]    Special Requests  Final    NONE Performed at [facility], [address], [place], [place] [address]    Culture   Final    NO GROWTH Performed at [facility], [address], [place], [place] [address]    Report Status 09/28/2019 FINAL  Final      Studies: No results found.  Scheduled Meds: . [START ON 09/30/2019] amLODipine  10 mg Oral Daily  . aspirin  81 mg Oral Daily  . clopidogrel  75 mg Oral Q breakfast  . docusate sodium  100 mg Oral Daily  . donepezil  10 mg Oral QHS  . enoxaparin (LOVENOX) injection  40 mg Subcutaneous Q24H  . escitalopram  5 mg Oral Daily  . insulin aspart  0-9 Units Subcutaneous TID WC  . ipratropium-albuterol  3 mL Nebulization TID  . metoprolol succinate  25 mg Oral Daily  . mometasone-formoterol  2 puff Inhalation BID  . rosuvastatin  5 mg Oral q1800  . sodium chloride flush  3 mL Intravenous Q12H    Continuous Infusions: . azithromycin Stopped (09/28/19 2313)  . cefTRIAXone (ROCEPHIN)  IV Stopped (09/28/19 1926)  . sodium phosphate  Dextrose 5% IVPB 20 mmol (09/29/19 1326)     LOS: 2 days     Darlin Drop, MD [facility] Pager [phone]  If 7PM-7AM, please contact night-coverage [email or web address] Password [email or web address] 09/29/2019, 4:30 PM

## 2019-09-30 ENCOUNTER — Inpatient Hospital Stay (HOSPITAL_COMMUNITY): Payer: Medicare Other

## 2019-09-30 DIAGNOSIS — M7989 Other specified soft tissue disorders: Secondary | ICD-10-CM

## 2019-09-30 LAB — PROCALCITONIN: Procalcitonin: 2.1 ng/mL

## 2019-09-30 LAB — BASIC METABOLIC PANEL
Anion gap: 6 (ref 5–15)
BUN: 16 mg/dL (ref 8–23)
CO2: 26 mmol/L (ref 22–32)
Calcium: 8.1 mg/dL — ABNORMAL LOW (ref 8.9–10.3)
Chloride: 108 mmol/L (ref 98–111)
Creatinine, Ser: 0.53 mg/dL (ref 0.44–1.00)
GFR calc Af Amer: >60 mL/min (ref >60)
GFR calc non Af Amer: >60 mL/min (ref >60)
Glucose, Bld: 145 mg/dL — ABNORMAL HIGH (ref 70–99)
Potassium: 3.1 mmol/L — ABNORMAL LOW (ref 3.5–5.1)
Sodium: 140 mmol/L (ref 135–145)

## 2019-09-30 LAB — CBC WITH DIFFERENTIAL/PLATELET
Abs Immature Granulocytes: 0.54 10*3/uL — ABNORMAL HIGH (ref 0.00–0.07)
Basophils Absolute: 0 10*3/uL (ref 0.0–0.1)
Basophils Relative: 0 %
Eosinophils Absolute: 0 10*3/uL (ref 0.0–0.5)
Eosinophils Relative: 0 %
HCT: 37.2 % (ref 36.0–46.0)
Hemoglobin: 12 g/dL (ref 12.0–15.0)
Immature Granulocytes: 4 %
Lymphocytes Relative: 8 %
Lymphs Abs: 1.1 10*3/uL (ref 0.7–4.0)
MCH: 29.3 pg (ref 26.0–34.0)
MCHC: 32.3 g/dL (ref 30.0–36.0)
MCV: 90.7 fL (ref 80.0–100.0)
Monocytes Absolute: 0.8 10*3/uL (ref 0.1–1.0)
Monocytes Relative: 6 %
Neutro Abs: 10.9 10*3/uL — ABNORMAL HIGH (ref 1.7–7.7)
Neutrophils Relative %: 82 %
Platelets: 256 10*3/uL (ref 150–400)
RBC: 4.1 MIL/uL (ref 3.87–5.11)
RDW: 14.6 % (ref 11.5–15.5)
WBC: 13.3 10*3/uL — ABNORMAL HIGH (ref 4.0–10.5)
nRBC: 0 % (ref 0.0–0.2)

## 2019-09-30 LAB — GLUCOSE, CAPILLARY
Glucose-Capillary: 134 mg/dL — ABNORMAL HIGH (ref 70–99)
Glucose-Capillary: 165 mg/dL — ABNORMAL HIGH (ref 70–99)
Glucose-Capillary: 134 mg/dL — ABNORMAL HIGH (ref 70–99)
Glucose-Capillary: 149 mg/dL — ABNORMAL HIGH (ref 70–99)

## 2019-09-30 LAB — POTASSIUM: Potassium: 3.7 mmol/L (ref 3.5–5.1)

## 2019-09-30 LAB — TROPONIN I (HIGH SENSITIVITY)
Troponin I (High Sensitivity): 125 ng/L (ref <18)
Troponin I (High Sensitivity): 100 ng/L (ref <18)
Troponin I (High Sensitivity): 105 ng/L (ref <18)

## 2019-09-30 LAB — FIBRINOGEN: Fibrinogen: 681 mg/dL — ABNORMAL HIGH (ref 210–475)

## 2019-09-30 LAB — D-DIMER, QUANTITATIVE: D-Dimer, Quant: 3 ug/mL-FEU — ABNORMAL HIGH (ref 0.00–0.50)

## 2019-09-30 MED ORDER — IRBESARTAN 300 MG PO TABS
300.0000 mg | ORAL_TABLET | Freq: Every day | ORAL | Status: DC
  Administered 2019-09-30: 10:00:00 300 mg via ORAL
  Filled 2019-09-30: qty 1

## 2019-09-30 MED ORDER — POTASSIUM CHLORIDE 10 MEQ/100ML IV SOLN
10.0000 meq | INTRAVENOUS | Status: AC
  Administered 2019-09-30 (×4): 10 meq via INTRAVENOUS
  Filled 2019-09-30 (×4): qty 100

## 2019-09-30 MED ORDER — KETOROLAC TROMETHAMINE 0.5 % OP SOLN
1.0000 [drp] | Freq: Every day | OPHTHALMIC | Status: DC
  Administered 2019-09-30 – 2019-10-04 (×5): 1 [drp] via OPHTHALMIC
  Filled 2019-09-30: qty 5

## 2019-09-30 MED ORDER — AMLODIPINE-OLMESARTAN 5-40 MG PO TABS: 1.0000 | ORAL_TABLET | Freq: Every day | ORAL | Status: DC

## 2019-09-30 MED ORDER — IOHEXOL 350 MG/ML SOLN
100.0000 mL | Freq: Once | INTRAVENOUS | Status: AC | PRN
  Administered 2019-09-30: 80 mL via INTRAVENOUS

## 2019-09-30 MED ORDER — SODIUM CHLORIDE 0.9 % IV SOLN: INTRAVENOUS | Status: DC

## 2019-09-30 MED ORDER — AMLODIPINE BESYLATE 5 MG PO TABS
5.0000 mg | ORAL_TABLET | Freq: Every day | ORAL | Status: DC
  Administered 2019-09-30: 5 mg via ORAL
  Filled 2019-09-30: qty 1

## 2019-09-30 MED ORDER — HYDRALAZINE HCL 20 MG/ML IJ SOLN
2.0000 mg | Freq: Four times a day (QID) | INTRAMUSCULAR | Status: DC | PRN
  Administered 2019-09-30 – 2019-10-02 (×3): 2 mg via INTRAVENOUS
  Filled 2019-09-30 (×3): qty 1

## 2019-09-30 NOTE — Progress Notes (Signed)
Bilateral lower extremity venous duplex completed. Refer to "CV Proc" under chart review to view preliminary results.  09/30/2019 1:47 PM Eula Fried., MHA, RVT, RDCS, RDMS

## 2019-09-30 NOTE — Progress Notes (Signed)
CRITICAL VALUE ALERT  Critical Value:  Trop 125  Date & Time Notied:  2/5 1345  Provider Notified: Margo Aye  Orders Received/Actions taken:

## 2019-09-30 NOTE — Progress Notes (Signed)
PROGRESS NOTE  [name] FHL:[id number] DOB: 05/13/1923 DOA: 09/27/2019 PCP: Chilton Greathouse, MD  HPI/Recap of past 24 hours: [name] is a 86 y.o. female with medical history significant for dementia, type 2 diabetes mellitus, ischemic CVA, hypertension, urinary incontinence, asthma, who is admitted to [facility] on 09/27/2019 with severe sepsis due to left lower lobe pneumonia after presenting from SNF to [facility] Emergency Department for evaluation of fever.   Hypoxic on presentation.  Responding well to empiric IV antibiotics.  Still hypoxic.  Repeat O2 desaturation this morning.  Work-up revealed elevated D-dimer 3.0 elevated fibrinogen greater than 680, elevated troponin.  Bilateral lower extremity Doppler ultrasound negative for DVT.  Will obtain a CTA to rule out PE.  Discussed with the patient's son [name].  09/30/19: Seen and examined this morning.  She denies any chest pain.  She does not have any new complaints.  Bibasilar rales noted on exam therefore IV fluid was cut off.  We will restart gentle IV fluids to avoid AKI in the setting of IV contrast for CTA PE.   Assessment/Plan: Principal Problem:   LLL pneumonia Active Problems:   Essential hypertension   Severe sepsis (HCC)   Fever   Generalized weakness   Dehydration   Sepsis 2/2 LLL CAP Improving clinically. Reviewed CXR personally infiltrates noted at LLL Continue IV abx empirically IV azithromycin and Rocephin day #3. Continue to maintain O2 sat>92% Procalcitonin is trending down to 2 from 4. Elevated lactic acid 2.9>> 2.7 Blood cx negative to date, continue to follow cx  Elevated troponin likely demand ischemia in the setting of hypoxia Denies any anginal symptoms Troponin peaked at 125 and trending down Personally reviewed twelve-lead EKG no sign of acute ischemia. Will obtain 2D echo.  Elevated D-dimer in the setting of hypoxia Bilateral lower extremity Doppler  ultrasound negative for DVT CTA PE ordered and pending Start gentle IV fluid hydration normal saline at 50 cc/h to avoid AKI in the setting of IV contrast  Acute hypoxic respiratory failure 2/2 to CAP No O2 supplementation at baseline Management as above Continue bronchodilators, IS flutter valve  History of asthma Continue home medications Continue to maintain O2 saturation greater than 92%.  Type 2 DM A1C 7.2 on 09/28/2019. Continue ISS  Resolved post repletion: Hypomagnesemia  Mag 1.5>> 2.4  Improving with IV fluid hydration: Dehydration Poor oral intake Restart gentle IV fluid hydration normal saline at 50 cc/h.  Hx ischemic CVA Continue ASA, plavix and crestor  Essential HTN Restart home medications IV hydralazine 2 mg every 6 hours as needed for SBP greater than 160 Avoid hypotension  Physical debility/Generalized weakness PT OT to assess. Out of bed to chair as tolerated  Chronic anxiety/depression Continue home meds   DVT prophylaxis: SCDs, sq lovenox daily Code Status: Full code Family Communication: Updated the patient's son Charmayne Sheer via phone.  Disposition Plan: From ALF; anticipate dc to ALF once respiratory status close to baseline likely in the next 24 to 48 hours. Barrier to dc ongoing work-up to rule out PE.  Consults called:  Dr. Sharyn Lull the patient's cardiologist will consult if there are any cardiac issues..      Objective: Vitals:   09/30/19 0811 09/30/19 0953 09/30/19 1210 09/30/19 1500  BP: (!) 158/66 (!) 143/61 (!) 165/70   Pulse: (!) 108 93 99   Resp: (!) 32 (!) 26 (!) 23   Temp: 99.5 F (37.5 C) 98.9 F (37.2 C) 98.4 F (  36.9 C)   TempSrc: Oral Oral    SpO2: 93% 94% 94% 94%  Weight:      Height:        Intake/Output Summary (Last 24 hours) at 09/30/2019 1534 Last data filed at 09/30/2019 1030 Gross per 24 hour  Intake 1045.91 ml  Output --  Net 1045.91 ml   Filed Weights   09/28/19 1654 09/29/19 0514 09/30/19 0500  Weight:  55.7 kg 59.8 kg 61.1 kg    Exam:  . General: 86 y.o. year-old female pleasant well-developed and nourished in no acute distress.  Alert and interactive. . Cardiovascular: Regular rate and rhythm no rubs or gallops.   Marland Kitchen Respiratory: Rales noted at bases.  No wheezing noted.  Poor inspiratory effort. . Abdomen: Soft nontender normal bowel sounds present.   Musculoskeletal: Trace lower extremity edema bilaterally.   Psychiatry: Mood is appropriate for condition and setting.    Data Reviewed: CBC: Recent Labs  Lab 09/27/19 1718 09/28/19 0520 09/29/19 0507 09/30/19 0409  WBC 14.7* 14.2* 17.2* 13.3*  NEUTROABS 13.4* 12.9* 14.8* 10.9*  HGB 13.3 13.0 11.9* 12.0  HCT 40.7 40.2 36.8 37.2  MCV 90.0 91.4 90.9 90.7  PLT 277 252 238 256   Basic Metabolic Panel: Recent Labs  Lab 09/27/19 1718 09/28/19 0520 09/29/19 0507 09/30/19 0409 09/30/19 1250  NA 134* 139 138 140  --   K 3.8 3.6 3.5 3.1* 3.7  CL 98 105 106 108  --   CO2 27 23 26 26   --   GLUCOSE 308* 183* 142* 145*  --   BUN 17 16 22 16   --   CREATININE 0.82 0.61 0.53 0.53  --   CALCIUM 8.9 8.2* 8.2* 8.1*  --   MG 1.7 1.5* 2.4  --   --   PHOS  --   --  1.7*  --   --    GFR: Estimated Creatinine Clearance: 35.6 mL/min (by C-G formula based on SCr of 0.53 mg/dL). Liver Function Tests: Recent Labs  Lab 09/27/19 1718  AST 20  ALT 16  ALKPHOS 48  BILITOT 1.1  PROT 6.8  ALBUMIN 3.6   No results for input(s): LIPASE, AMYLASE in the last 168 hours. No results for input(s): AMMONIA in the last 168 hours. Coagulation Profile: Recent Labs  Lab 09/27/19 1718  INR 1.1   Cardiac Enzymes: No results for input(s): CKTOTAL, CKMB, CKMBINDEX, TROPONINI in the last 168 hours. BNP (last 3 results) No results for input(s): PROBNP in the last 8760 hours. HbA1C: Recent Labs    09/28/19 0520  HGBA1C 7.2*   CBG: Recent Labs  Lab 09/29/19 1112 09/29/19 1605 09/29/19 2052 09/30/19 0727 09/30/19 1143  GLUCAP 184* 118*  220* 134* 165*   Lipid Profile: No results for input(s): CHOL, HDL, LDLCALC, TRIG, CHOLHDL, LDLDIRECT in the last 72 hours. Thyroid Function Tests: No results for input(s): TSH, T4TOTAL, FREET4, T3FREE, THYROIDAB in the last 72 hours. Anemia Panel: No results for input(s): VITAMINB12, FOLATE, FERRITIN, TIBC, IRON, RETICCTPCT in the last 72 hours. Urine analysis:    Component Value Date/Time   COLORURINE YELLOW 09/27/2019 1924   APPEARANCEUR CLEAR 09/27/2019 1924   LABSPEC 1.017 09/27/2019 1924   PHURINE 6.0 09/27/2019 1924   GLUCOSEU >=500 (A) 09/27/2019 1924   HGBUR NEGATIVE 09/27/2019 1924   BILIRUBINUR NEGATIVE 09/27/2019 1924   KETONESUR 5 (A) 09/27/2019 1924   PROTEINUR 30 (A) 09/27/2019 1924   NITRITE NEGATIVE 09/27/2019 1924   LEUKOCYTESUR NEGATIVE 09/27/2019 1924  Sepsis Labs: @LABRCNTIP (procalcitonin:4,lacticidven:4)  ) Recent Results (from the past 240 hour(s))  Blood Culture (routine x 2)     Status: None (Preliminary result)   Collection Time: 09/27/19  5:18 PM   Specimen: BLOOD  Result Value Ref Range Status   Specimen Description   Final    BLOOD LEFT ANTECUBITAL Performed at [facility] [address], [place], [place] [address]    Special Requests   Final    BOTTLES DRAWN AEROBIC AND ANAEROBIC Blood Culture results may not be optimal due to an excessive volume of blood received in culture bottles Performed at [facility] [address] [place], [place] [address]    Culture   Final    NO GROWTH 3 DAYS Performed at [facility], [place] [address], [place], [place] [address]    Report Status PENDING  Incomplete  Respiratory Panel by RT PCR (Flu A&B, Covid) - Nasopharyngeal Swab     Status: None   Collection Time: 09/27/19  5:23 PM   Specimen: Nasopharyngeal Swab  Result Value Ref Range Status   SARS Coronavirus 2 by RT PCR NEGATIVE NEGATIVE Final    Comment: (NOTE) SARS-CoV-2 target nucleic acids are NOT  DETECTED. The SARS-CoV-2 RNA is generally detectable in upper respiratoy specimens during the acute phase of infection. The lowest concentration of SARS-CoV-2 viral copies this assay can detect is 131 copies/mL. A negative result does not preclude SARS-Cov-2 infection and should not be used as the sole basis for treatment or other patient management decisions. A negative result may occur with  improper specimen collection/handling, submission of specimen other than nasopharyngeal swab, presence of viral mutation(s) within the areas targeted by this assay, and inadequate number of viral copies (<131 copies/mL). A negative result must be combined with clinical observations, patient history, and epidemiological information. The expected result is Negative. Fact Sheet for Patients:  PinkCheek.be Fact Sheet for Healthcare Providers:  GravelBags.it This test is not yet ap proved or cleared by the [place] FDA and  has been authorized for detection and/or diagnosis of SARS-CoV-2 by FDA under an Emergency Use Authorization (EUA). This EUA will remain  in effect (meaning this test can be used) for the duration of the COVID-19 declaration under Section 564(b)(1) of the Act, 21 U.S.C. section 360bbb-3(b)(1), unless the authorization is terminated or revoked sooner.    Influenza A by PCR NEGATIVE NEGATIVE Final   Influenza B by PCR NEGATIVE NEGATIVE Final    Comment: (NOTE) The Xpert Xpress SARS-CoV-2/FLU/RSV assay is intended as an aid in  the diagnosis of influenza from Nasopharyngeal swab specimens and  should not be used as a sole basis for treatment. Nasal washings and  aspirates are unacceptable for Xpert Xpress SARS-CoV-2/FLU/RSV  testing. Fact Sheet for Patients: PinkCheek.be Fact Sheet for Healthcare Providers: GravelBags.it This test is not yet approved or cleared  by the [place] FDA and  has been authorized for detection and/or diagnosis of SARS-CoV-2 by  FDA under an Emergency Use Authorization (EUA). This EUA will remain  in effect (meaning this test can be used) for the duration of the  Covid-19 declaration under Section 564(b)(1) of the Act, 21  U.S.C. section 360bbb-3(b)(1), unless the authorization is  terminated or revoked. Performed at [facility], [place] [address], [place], [place] [address]   Blood Culture (routine x 2)     Status: None (Preliminary result)   Collection Time: 09/27/19  5:33 PM   Specimen: BLOOD  Result Value Ref Range Status   Specimen Description   Final    BLOOD RIGHT ANTECUBITAL Performed at [facility], [address], [place], [place] [address]    Special Requests   Final    BOTTLES DRAWN AEROBIC AND ANAEROBIC Blood Culture adequate volume Performed at [facility], [address], [place], [place] [address]    Culture   Final    NO GROWTH 3 DAYS Performed at [facility], [address] [place], [place] [address]    Report Status PENDING  Incomplete  Urine culture     Status: None   Collection Time: 09/27/19  7:24 PM   Specimen: In/Out Cath Urine  Result Value Ref Range Status   Specimen Description   Final    IN/OUT CATH URINE Performed at [facility], [address], [place], [place] [address]    Special Requests   Final    NONE Performed at [facility], [address], [place], [place] [address]    Culture   Final    NO GROWTH Performed at [facility], [address], [place], [place] [address]    Report Status 09/28/2019 FINAL  Final      Studies: VAS [place] LOWER EXTREMITY VENOUS (DVT)  Result Date: 09/30/2019  Lower Venous DVTStudy Indications: Swelling.  Limitations: Body habitus and poor ultrasound/tissue interface. Comparison Study: No prior study Performing Technologist:  Gertie Fey MHA, RDMS, RVT, RDCS  Examination Guidelines: A complete evaluation includes B-mode imaging, spectral Doppler, color Doppler, and power Doppler as needed of all accessible portions of each vessel. Bilateral testing is considered an integral part of a complete examination. Limited examinations for reoccurring indications may be performed as noted. The reflux portion of the exam is performed with the patient in reverse Trendelenburg.  +---------+---------------+---------+-----------+----------+--------------+ RIGHT    CompressibilityPhasicitySpontaneityPropertiesThrombus Aging +---------+---------------+---------+-----------+----------+--------------+ CFV      Full           Yes      Yes                                 +---------+---------------+---------+-----------+----------+--------------+ SFJ      Full                                                        +---------+---------------+---------+-----------+----------+--------------+ FV Prox  Full                                                        +---------+---------------+---------+-----------+----------+--------------+ FV Mid   Full                                                        +---------+---------------+---------+-----------+----------+--------------+ FV DistalFull                                                        +---------+---------------+---------+-----------+----------+--------------+  PFV      Full                                                        +---------+---------------+---------+-----------+----------+--------------+ POP      Full           Yes      Yes                                 +---------+---------------+---------+-----------+----------+--------------+ PTV      Full                                                        +---------+---------------+---------+-----------+----------+--------------+ PERO     Full                                                         +---------+---------------+---------+-----------+----------+--------------+   +---------+---------------+---------+-----------+----------+--------------+ LEFT     CompressibilityPhasicitySpontaneityPropertiesThrombus Aging +---------+---------------+---------+-----------+----------+--------------+ CFV      Full           Yes      Yes                                 +---------+---------------+---------+-----------+----------+--------------+ SFJ      Full                                                        +---------+---------------+---------+-----------+----------+--------------+ FV Prox  Full                                                        +---------+---------------+---------+-----------+----------+--------------+ FV Mid   Full                                                        +---------+---------------+---------+-----------+----------+--------------+ FV DistalFull                                                        +---------+---------------+---------+-----------+----------+--------------+ PFV      Full                                                        +---------+---------------+---------+-----------+----------+--------------+  POP      Full           Yes      Yes                                 +---------+---------------+---------+-----------+----------+--------------+ PTV      Full                                                        +---------+---------------+---------+-----------+----------+--------------+ PERO     Full                                                        +---------+---------------+---------+-----------+----------+--------------+     Summary: RIGHT: - There is no evidence of deep vein thrombosis in the lower extremity.  - No cystic structure found in the popliteal fossa.  LEFT: - There is no evidence of deep vein thrombosis in the lower extremity.  - No cystic structure  found in the popliteal fossa.  *See table(s) above for measurements and observations.    Preliminary     Scheduled Meds: . amLODipine  5 mg Oral Daily   And  . irbesartan  300 mg Oral Daily  . aspirin  81 mg Oral Daily  . clopidogrel  75 mg Oral Q breakfast  . docusate sodium  100 mg Oral Daily  . donepezil  10 mg Oral QHS  . enoxaparin (LOVENOX) injection  40 mg Subcutaneous Q24H  . escitalopram  5 mg Oral Daily  . insulin aspart  0-9 Units Subcutaneous TID WC  . ipratropium-albuterol  3 mL Nebulization TID  . metoprolol succinate  25 mg Oral Daily  . mometasone-formoterol  2 puff Inhalation BID  . rosuvastatin  5 mg Oral q1800  . sodium chloride flush  3 mL Intravenous Q12H    Continuous Infusions: . azithromycin Stopped (09/29/19 2318)  . cefTRIAXone (ROCEPHIN)  IV Stopped (09/29/19 2210)     LOS: 3 days     Darlin Drop, MD [facility] Pager [phone]  If 7PM-7AM, please contact night-coverage [email or web address] Password [facility] 09/30/2019, 3:34 PM

## 2019-09-30 NOTE — Progress Notes (Signed)
   Vital Signs MEWS/VS Documentation      09/29/2019 2227 09/30/2019 0400 09/30/2019 0735 09/30/2019 0811   MEWS Score:  0  1  --  3   MEWS Score Color:  Green  Green  --  Yellow   Resp:  --  18  --  (!) 32   Pulse:  --  (!) 103  --  (!) 108   BP:  --  (!) 172/90  --  (!) 158/66   Temp:  --  98.3 F (36.8 C)  --  99.5 F (37.5 C)   O2 Device:  --  Nasal Cannula  Nasal Cannula  Nasal Cannula   O2 Flow Rate (L/min):  --  1 L/min  1 L/min  3 L/min   Level of Consciousness:  Alert  --  --  --     Acute change. MD notified. Restarted home BP medication. Yellow MEWS protocol initiated      Blondina Coderre 09/30/2019,8:51 AM

## 2019-10-01 ENCOUNTER — Inpatient Hospital Stay (HOSPITAL_COMMUNITY): Payer: Medicare Other

## 2019-10-01 DIAGNOSIS — A419 Sepsis, unspecified organism: Principal | ICD-10-CM

## 2019-10-01 DIAGNOSIS — J90 Pleural effusion, not elsewhere classified: Secondary | ICD-10-CM

## 2019-10-01 DIAGNOSIS — J189 Pneumonia, unspecified organism: Secondary | ICD-10-CM

## 2019-10-01 DIAGNOSIS — J9601 Acute respiratory failure with hypoxia: Secondary | ICD-10-CM

## 2019-10-01 DIAGNOSIS — R531 Weakness: Secondary | ICD-10-CM

## 2019-10-01 DIAGNOSIS — R6520 Severe sepsis without septic shock: Secondary | ICD-10-CM

## 2019-10-01 LAB — CBC WITH DIFFERENTIAL/PLATELET
Abs Immature Granulocytes: 0.65 10*3/uL — ABNORMAL HIGH (ref 0.00–0.07)
Basophils Absolute: 0 10*3/uL (ref 0.0–0.1)
Basophils Relative: 0 %
Eosinophils Absolute: 0 10*3/uL (ref 0.0–0.5)
Eosinophils Relative: 0 %
HCT: 38.5 % (ref 36.0–46.0)
Hemoglobin: 12.5 g/dL (ref 12.0–15.0)
Immature Granulocytes: 6 %
Lymphocytes Relative: 9 %
Lymphs Abs: 1 10*3/uL (ref 0.7–4.0)
MCH: 29.2 pg (ref 26.0–34.0)
MCHC: 32.5 g/dL (ref 30.0–36.0)
MCV: 90 fL (ref 80.0–100.0)
Monocytes Absolute: 0.8 10*3/uL (ref 0.1–1.0)
Monocytes Relative: 8 %
Neutro Abs: 8 10*3/uL — ABNORMAL HIGH (ref 1.7–7.7)
Neutrophils Relative %: 77 %
Platelets: 270 10*3/uL (ref 150–400)
RBC: 4.28 MIL/uL (ref 3.87–5.11)
RDW: 14.8 % (ref 11.5–15.5)
WBC: 10.5 10*3/uL (ref 4.0–10.5)
nRBC: 0 % (ref 0.0–0.2)

## 2019-10-01 LAB — BASIC METABOLIC PANEL
Anion gap: 15 (ref 5–15)
BUN: 11 mg/dL (ref 8–23)
CO2: 24 mmol/L (ref 22–32)
Calcium: 7.9 mg/dL — ABNORMAL LOW (ref 8.9–10.3)
Chloride: 102 mmol/L (ref 98–111)
Creatinine, Ser: 0.48 mg/dL (ref 0.44–1.00)
GFR calc Af Amer: >60 mL/min (ref >60)
GFR calc non Af Amer: >60 mL/min (ref >60)
Glucose, Bld: 133 mg/dL — ABNORMAL HIGH (ref 70–99)
Potassium: 3.4 mmol/L — ABNORMAL LOW (ref 3.5–5.1)
Sodium: 141 mmol/L (ref 135–145)

## 2019-10-01 LAB — ECHOCARDIOGRAM COMPLETE
Height: 63 in
Weight: 2208.13 oz

## 2019-10-01 LAB — GLUCOSE, CAPILLARY
Glucose-Capillary: 160 mg/dL — ABNORMAL HIGH (ref 70–99)
Glucose-Capillary: 229 mg/dL — ABNORMAL HIGH (ref 70–99)
Glucose-Capillary: 208 mg/dL — ABNORMAL HIGH (ref 70–99)
Glucose-Capillary: 186 mg/dL — ABNORMAL HIGH (ref 70–99)

## 2019-10-01 MED ORDER — AMLODIPINE BESYLATE 10 MG PO TABS
10.0000 mg | ORAL_TABLET | Freq: Every day | ORAL | Status: DC
  Administered 2019-10-01 – 2019-10-03 (×3): 10 mg via ORAL
  Filled 2019-10-01 (×5): qty 1

## 2019-10-01 MED ORDER — POTASSIUM CHLORIDE CRYS ER 20 MEQ PO TBCR
40.0000 meq | EXTENDED_RELEASE_TABLET | Freq: Two times a day (BID) | ORAL | Status: DC
  Administered 2019-10-01: 40 meq via ORAL
  Filled 2019-10-01: qty 2

## 2019-10-01 MED ORDER — POTASSIUM CHLORIDE 20 MEQ/15ML (10%) PO SOLN
40.0000 meq | Freq: Two times a day (BID) | ORAL | Status: AC
  Administered 2019-10-01 – 2019-10-02 (×4): 40 meq
  Filled 2019-10-01 (×4): qty 30

## 2019-10-01 MED ORDER — LIDOCAINE HCL 1 % IJ SOLN
INTRAMUSCULAR | Status: AC
  Filled 2019-10-01: qty 10

## 2019-10-01 MED ORDER — IPRATROPIUM BROMIDE 0.02 % IN SOLN
0.5000 mg | Freq: Four times a day (QID) | RESPIRATORY_TRACT | Status: DC
  Administered 2019-10-01 (×3): 0.5 mg via RESPIRATORY_TRACT
  Filled 2019-10-01 (×3): qty 2.5

## 2019-10-01 MED ORDER — GUAIFENESIN 100 MG/5ML PO SOLN: 10.0000 mL | Freq: Four times a day (QID) | ORAL | Status: DC | PRN

## 2019-10-01 MED ORDER — LEVALBUTEROL HCL 0.63 MG/3ML IN NEBU
0.6300 mg | INHALATION_SOLUTION | Freq: Four times a day (QID) | RESPIRATORY_TRACT | Status: DC
  Administered 2019-10-01 (×3): 0.63 mg via RESPIRATORY_TRACT
  Filled 2019-10-01 (×3): qty 3

## 2019-10-01 MED ORDER — SODIUM PHOSPHATES 45 MMOLE/15ML IV SOLN
10.0000 mmol | Freq: Once | INTRAVENOUS | Status: AC
  Administered 2019-10-01: 10 mmol via INTRAVENOUS
  Filled 2019-10-01: qty 3.33

## 2019-10-01 MED ORDER — SODIUM PHOSPHATES 45 MMOLE/15ML IV SOLN: 20.0000 mmol | Freq: Once | INTRAVENOUS | Status: DC

## 2019-10-01 MED ORDER — DM-GUAIFENESIN ER 30-600 MG PO TB12
1.0000 | ORAL_TABLET | Freq: Two times a day (BID) | ORAL | Status: DC
  Administered 2019-10-01 – 2019-10-05 (×9): 1 via ORAL
  Filled 2019-10-01 (×9): qty 1

## 2019-10-01 MED ORDER — LEVALBUTEROL HCL 0.63 MG/3ML IN NEBU
0.6300 mg | INHALATION_SOLUTION | Freq: Three times a day (TID) | RESPIRATORY_TRACT | Status: DC
  Administered 2019-10-02 – 2019-10-05 (×10): 0.63 mg via RESPIRATORY_TRACT
  Filled 2019-10-01 (×10): qty 3

## 2019-10-01 MED ORDER — FUROSEMIDE 10 MG/ML IJ SOLN
20.0000 mg | Freq: Two times a day (BID) | INTRAMUSCULAR | Status: DC
  Administered 2019-10-01: 20 mg via INTRAVENOUS
  Filled 2019-10-01: qty 2

## 2019-10-01 MED ORDER — IRBESARTAN 300 MG PO TABS
300.0000 mg | ORAL_TABLET | Freq: Every day | ORAL | Status: DC
  Administered 2019-10-01 – 2019-10-03 (×3): 300 mg via ORAL
  Filled 2019-10-01 (×4): qty 1

## 2019-10-01 MED ORDER — POTASSIUM CHLORIDE 10 MEQ/100ML IV SOLN: 10.0000 meq | INTRAVENOUS | Status: DC

## 2019-10-01 MED ORDER — METOPROLOL SUCCINATE ER 25 MG PO TB24
37.5000 mg | ORAL_TABLET | Freq: Every day | ORAL | Status: DC
  Administered 2019-10-01 – 2019-10-02 (×2): 37.5 mg via ORAL
  Filled 2019-10-01 (×2): qty 2

## 2019-10-01 MED ORDER — IPRATROPIUM BROMIDE 0.02 % IN SOLN
0.5000 mg | Freq: Three times a day (TID) | RESPIRATORY_TRACT | Status: DC
  Administered 2019-10-02 – 2019-10-05 (×10): 0.5 mg via RESPIRATORY_TRACT
  Filled 2019-10-01 (×10): qty 2.5

## 2019-10-01 MED ORDER — FUROSEMIDE 10 MG/ML IJ SOLN
40.0000 mg | Freq: Three times a day (TID) | INTRAMUSCULAR | Status: DC
  Administered 2019-10-01: 21:00:00 30 mg via INTRAVENOUS
  Administered 2019-10-01 – 2019-10-02 (×2): 40 mg via INTRAVENOUS
  Filled 2019-10-01 (×3): qty 4

## 2019-10-01 NOTE — Progress Notes (Signed)
  Echocardiogram 2D Echocardiogram has been performed.  Joren Rehm A Chamia Schmutz 10/01/2019, 9:48 AM

## 2019-10-01 NOTE — Consult Note (Signed)
   NAME:  [name], MRN:  [id number], DOB:  May 27, 1923, LOS: 4 ADMISSION DATE:  09/27/2019, CONSULTATION DATE:  10/01/2019 REFERRING MD:  Dr Margo Aye, CHIEF COMPLAINT: Pneumonia, pleural effusion  Brief History   86 year old lady presented with complaints of fever -temperature 101  currently being treated for pneumonia  History of nonproductive cough  Has bilateral pleural effusions No chills or rigors,  She does have a baseline of dementia, type 2 diabetes, ischemic CVA, hypertension, urinary incontinence, history of asthma  Resides in a skilled nursing facility  Past Medical History   Past Medical History:  Diagnosis Date  . Diabetes (HCC)   . Hearing loss   . Thalamic infarction (HCC)      Significant Hospital Events   Hypoxemia-on oxygen supplementation Consults:  PCCM 2/6  Procedures:  None   Significant Diagnostic Tests:  Chest x-ray-bilateral effusions  CT scan of the chest with extensive pneumonia on the left, bilateral pleural effusions-reviewed by myself, reviewed with Dr. Charmayne Sheer Reihl-patient's son  Micro Data:  2/2 blood cultures no growth 2/2 urine culture-no growth  Antimicrobials:  Azithromycin >> Rocephin>>   Interim history/subjective:  Cough, shortness of breath Feels exhausted  Objective   Blood pressure (!) 145/78, pulse 95, temperature (!) 97.5 F (36.4 C), temperature source Oral, resp. rate (!) 30, height 5\' 3"  (1.6 m), weight 62.6 kg, SpO2 94 %.        Intake/Output Summary (Last 24 hours) at 10/01/2019 1425 Last data filed at 10/01/2019 1041 Gross per 24 hour  Intake 1094.43 ml  Output 175 ml  Net 919.43 ml   Filed Weights   09/29/19 0514 09/30/19 0500 10/01/19 0457  Weight: 59.8 kg 61.1 kg 62.6 kg    Examination: General: Elderly lady, frail, intermittent coughing fits HENT: Dry oral mucosa Lungs: Decreased air entry at the bases bilaterally Cardiovascular: S1-S2 appreciated Abdomen: Soft, bowel sounds  appreciated Extremities: No clubbing, no edema Neuro: Awake and alert GU:   Resolved Hospital Problem list     Assessment & Plan:  Extensive left-sided pneumonia Sepsis secondary to pneumonia -Has been on azithromycin and Rocephin -Improvement in leukocytosis -Procalcitonin is improving -Negative blood cultures  Hypoxemic respiratory failure History of asthma -Continue oxygen supplementation   Bilateral pleural effusion -Likely related to decompensated diastolic heart failure -We will increase dose of Lasix to 40 every 8 -Trend electrolytes  Type 2 diabetes -Continue SSI  Electrolyte derangement -Replace potassium  Chronic anxiety/depression  We will repeat chest x-ray in a.m. Trend electrolytes Consider ultrasound-guided thoracentesis if patient not diuresing well and still short of breath  ABG    Component Value Date/Time   PHART 7.358 09/28/2019 0342   PCO2ART 47.1 09/28/2019 0342   PO2ART 92.9 09/28/2019 0342   HCO3 25.6 09/28/2019 0342   TCO2 24 08/15/2017 1300    Tiphanie Vo 08/17/2017, MD [place], PCCM Cell: [phone]

## 2019-10-01 NOTE — Progress Notes (Addendum)
PROGRESS NOTE  [name] YIR:[id number] DOB: 09-28-1922 DOA: 09/27/2019 PCP: Prince Solian, MD  HPI/Recap of past 24 hours: [name] is a 86 y.o. female with medical history significant for dementia, type 2 diabetes mellitus, ischemic CVA, hypertension, urinary incontinence, asthma, who is admitted to [facility] on 09/27/2019 with severe sepsis due to left lower lobe pneumonia after presenting from SNF to [facility] Emergency Department for evaluation of fever.   Hypoxic on presentation.  Responding well to empiric IV antibiotics.  Still hypoxic.  Per her son [name] who is MD/opthalmologist, he thinks she may have had a PE.  Work-up revealed elevated D-dimer 3.0 elevated fibrinogen greater than 680, elevated troponin.  Bilateral lower extremity Doppler ultrasound negative for DVT.  CTA PE negative for PE.  Patient was started on gentle IV fluid NS at 50 cc/hr to avoid contrast induced nephropathy.  Discussed with the patient's son [name].  10/01/19: Dyspneic early this morning CXR shows pulmonary edema with b/l pleural effusions.  IV fluid stopped.  IV lasix 20 mg ordered BID x 2 doses.  2D echo showed LVEF 70-75% with grade I DD.  Personally reviewed imaging.  Pulmonary edema, b/l pleural effusion worse on the left.  Patient's son [name] requested pulmonary consult.   Assessment/Plan: Principal Problem:   LLL pneumonia Active Problems:   Essential hypertension   Severe sepsis (HCC)   Fever   Generalized weakness   Dehydration   Sepsis 2/2 LLL CAP Reviewed CXR personally infiltrates noted at LLL Continue IV abx empirically IV azithromycin and Rocephin day #4. Continue to maintain O2 sat>92% Procalcitonin is trending down to 2 from 4. Elevated lactic acid 2.9>> 2.7 Blood cx negative to date, continue to follow cx  Acute hypoxic respiratory failure secondary to left lower lobe community-acquired pneumonia complicated by pulmonary edema and  bilateral pleural effusions left greater than right. Not on O2 supplementation at baseline Currently requiring 5 L to maintain O2 saturation greater than 92% Continue diuresing as recommended by PCCM Continue bronchodilators Continue incentive spirometer and flutter valve  Bilateral pleural effusion left greater than right likely in the setting of pneumonia superimposed by volume overload Continue diuresing and if no improvement thoracentesis by IR if family is agreeable  Elevated troponin likely demand ischemia in the setting of hypoxia Denies any anginal symptoms Troponin peaked at 125 and trending down Personally reviewed twelve-lead EKG no sign of acute ischemia.  Unspecified diastolic CHF Unclear if acute or chronic 2D echo done on 10/01/2019 showed LVEF 70 to 75% with grade 1 diastolic dysfunction Continue to monitor volume status Strict I's and O's and daily weights  Elevated D-dimer in the setting of hypoxia Bilateral lower extremity Doppler ultrasound negative for DVT CTA PE ordered and pending Start gentle IV fluid hydration normal saline at 50 cc/h to avoid AKI in the setting of IV contrast  History of asthma Continue home medications Continue to maintain O2 saturation greater than 92%.  Type 2 DM A1C 7.2 on 09/28/2019. Continue ISS  Resolved post repletion: Hypomagnesemia  Mag 1.5>> 2.4  Hx ischemic CVA Continue ASA, plavix and crestor  Essential HTN Continue home medications Continue diuresing IV hydralazine 2 mg every 6 hours as needed for SBP greater than 160 Avoid hypotension  Physical debility/Generalized weakness PT OT to assess when more stable. Out of bed to chair as tolerated  Chronic anxiety/depression Continue home meds   DVT prophylaxis: SCDs, sq lovenox daily Code Status: Full code Family  Communication: Updated the patient's son [name] via phone.  Disposition Plan: From ALF; anticipate dc to ALF once respiratory status close to  baseline likely in the next 48-72hours. Barrier to [employer] ongoing treatment for pneumonia and acute pulmonary edema   Consults called:   PCCM.   Cardiology: Dr. Sharyn Lull the patient's cardiologist will consult if there are any cardiac issues..      Objective: Vitals:   10/01/19 0550 10/01/19 1015 10/01/19 1118 10/01/19 1308  BP: (!) 153/70  140/73 (!) 145/78  Pulse: (!) 106  (!) 107 95  Resp:   (!) 31 (!) 30  Temp:   99.3 F (37.4 C) (!) 97.5 F (36.4 C)  TempSrc:   Oral Oral  SpO2: 98% 96% 96% 97%  Weight:      Height:        Intake/Output Summary (Last 24 hours) at 10/01/2019 1313 Last data filed at 10/01/2019 1041 Gross per 24 hour  Intake 1094.43 ml  Output 175 ml  Net 919.43 ml   Filed Weights   09/29/19 0514 09/30/19 0500 10/01/19 0457  Weight: 59.8 kg 61.1 kg 62.6 kg    Exam:  . General: 86 y.o. year-old female well-developed well-nourished appears uncomfortable due to dyspnea.   . Cardiovascular: Tachycardic no rubs or gallops. Marland Kitchen Respiratory: Rales bilaterally with poor inspiratory effort. . Abdomen: Soft Nontender Normal Bowel Sounds Present.   Musculoskeletal: Trace lower extremity edema bilaterally. Psychiatry: Mood is appropriate for condition and setting.   Data Reviewed: CBC: Recent Labs  Lab 09/27/19 1718 09/28/19 0520 09/29/19 0507 09/30/19 0409 10/01/19 0512  WBC 14.7* 14.2* 17.2* 13.3* 10.5  NEUTROABS 13.4* 12.9* 14.8* 10.9* 8.0*  HGB 13.3 13.0 11.9* 12.0 12.5  HCT 40.7 40.2 36.8 37.2 38.5  MCV 90.0 91.4 90.9 90.7 90.0  PLT 277 252 238 256 270   Basic Metabolic Panel: Recent Labs  Lab 09/27/19 1718 09/27/19 1718 09/28/19 0520 09/29/19 0507 09/30/19 0409 09/30/19 1250 10/01/19 0512  NA 134*  --  139 138 140  --  141  K 3.8   < > 3.6 3.5 3.1* 3.7 3.4*  CL 98  --  105 106 108  --  102  CO2 27  --  23 26 26   --  24  GLUCOSE 308*  --  183* 142* 145*  --  133*  BUN 17  --  16 22 16   --  11  CREATININE 0.82  --  0.61 0.53 0.53  --   0.48  CALCIUM 8.9  --  8.2* 8.2* 8.1*  --  7.9*  MG 1.7  --  1.5* 2.4  --   --   --   PHOS  --   --   --  1.7*  --   --   --    < > = values in this interval not displayed.   GFR: Estimated Creatinine Clearance: 35.6 mL/min (by C-G formula based on SCr of 0.48 mg/dL). Liver Function Tests: Recent Labs  Lab 09/27/19 1718  AST 20  ALT 16  ALKPHOS 48  BILITOT 1.1  PROT 6.8  ALBUMIN 3.6   No results for input(s): LIPASE, AMYLASE in the last 168 hours. No results for input(s): AMMONIA in the last 168 hours. Coagulation Profile: Recent Labs  Lab 09/27/19 1718  INR 1.1   Cardiac Enzymes: No results for input(s): CKTOTAL, CKMB, CKMBINDEX, TROPONINI in the last 168 hours. BNP (last 3 results) No results for input(s): PROBNP in the last 8760  hours. HbA1C: No results for input(s): HGBA1C in the last 72 hours. CBG: Recent Labs  Lab 09/30/19 1143 09/30/19 1558 09/30/19 2008 10/01/19 0749 10/01/19 1142  GLUCAP 165* 134* 149* 160* 229*   Lipid Profile: No results for input(s): CHOL, HDL, LDLCALC, TRIG, CHOLHDL, LDLDIRECT in the last 72 hours. Thyroid Function Tests: No results for input(s): TSH, T4TOTAL, FREET4, T3FREE, THYROIDAB in the last 72 hours. Anemia Panel: No results for input(s): VITAMINB12, FOLATE, FERRITIN, TIBC, IRON, RETICCTPCT in the last 72 hours. Urine analysis:    Component Value Date/Time   COLORURINE YELLOW 09/27/2019 1924   APPEARANCEUR CLEAR 09/27/2019 1924   LABSPEC 1.017 09/27/2019 1924   PHURINE 6.0 09/27/2019 1924   GLUCOSEU >=500 (A) 09/27/2019 1924   HGBUR NEGATIVE 09/27/2019 1924   BILIRUBINUR NEGATIVE 09/27/2019 1924   KETONESUR 5 (A) 09/27/2019 1924   PROTEINUR 30 (A) 09/27/2019 1924   NITRITE NEGATIVE 09/27/2019 1924   LEUKOCYTESUR NEGATIVE 09/27/2019 1924   Sepsis Labs: @LABRCNTIP (procalcitonin:4,lacticidven:4)  ) Recent Results (from the past 240 hour(s))  Blood Culture (routine x 2)     Status: None (Preliminary result)    Collection Time: 09/27/19  5:18 PM   Specimen: BLOOD  Result Value Ref Range Status   Specimen Description   Final    BLOOD LEFT ANTECUBITAL Performed at [facility], [address] [place], [place]    Special Requests   Final    BOTTLES DRAWN AEROBIC AND ANAEROBIC Blood Culture results may not be optimal due to an excessive volume of blood received in culture bottles Performed at [facility], [address], [place], [place]    Culture   Final    NO GROWTH 4 DAYS Performed at [facility], [address] [place], [place]    Report Status PENDING  Incomplete  Respiratory Panel by RT PCR (Flu A&B, Covid) - Nasopharyngeal Swab     Status: None   Collection Time: 09/27/19  5:23 PM   Specimen: Nasopharyngeal Swab  Result Value Ref Range Status   SARS Coronavirus 2 by RT PCR NEGATIVE NEGATIVE Final    Comment: (NOTE) SARS-CoV-2 target nucleic acids are NOT DETECTED. The SARS-CoV-2 RNA is generally detectable in upper respiratoy specimens during the acute phase of infection. The lowest concentration of SARS-CoV-2 viral copies this assay can detect is 131 copies/mL. A negative result does not preclude SARS-Cov-2 infection and should not be used as the sole basis for treatment or other patient management decisions. A negative result may occur with  improper specimen collection/handling, submission of specimen other than nasopharyngeal swab, presence of viral mutation(s) within the areas targeted by this assay, and inadequate number of viral copies (<131 copies/mL). A negative result must be combined with clinical observations, patient history, and epidemiological information. The expected result is Negative. Fact Sheet for Patients:  11/25/19 Fact Sheet for Healthcare Providers:  https://www.moore.com/ This test is not yet ap proved or cleared by the https://www.young.biz/ FDA and  has been authorized for detection and/or diagnosis of SARS-CoV-2 by FDA under an Emergency Use Authorization (EUA). This EUA will remain  in effect (meaning this test can be used) for the duration of the COVID-19 declaration under Section 564(b)(1) of the Act, 21 U.S.C. section 360bbb-3(b)(1), unless the authorization is terminated or revoked sooner.    Influenza A by PCR NEGATIVE NEGATIVE Final   Influenza B by PCR NEGATIVE NEGATIVE Final    Comment: (NOTE) The Xpert Xpress  SARS-CoV-2/FLU/RSV assay is intended as an aid in  the diagnosis of influenza from Nasopharyngeal swab specimens and  should not be used as a sole basis for treatment. Nasal washings and  aspirates are unacceptable for Xpert Xpress SARS-CoV-2/FLU/RSV  testing. Fact Sheet for Patients: https://www.moore.com/ Fact Sheet for Healthcare Providers: https://www.young.biz/ This test is not yet approved or cleared by the [place] FDA and  has been authorized for detection and/or diagnosis of SARS-CoV-2 by  FDA under an Emergency Use Authorization (EUA). This EUA will remain  in effect (meaning this test can be used) for the duration of the  Covid-19 declaration under Section 564(b)(1) of the Act, 21  U.S.C. section 360bbb-3(b)(1), unless the authorization is  terminated or revoked. Performed at [facility], [address] [place], [place] [address]   Blood Culture (routine x 2)     Status: None (Preliminary result)   Collection Time: 09/27/19  5:33 PM   Specimen: BLOOD  Result Value Ref Range Status   Specimen Description   Final    BLOOD RIGHT ANTECUBITAL Performed at [facility], [address], [place], [place] [address]    Special Requests   Final    BOTTLES DRAWN AEROBIC AND ANAEROBIC Blood Culture adequate volume Performed at [facility], [address], [place], [place] [address]     Culture   Final    NO GROWTH 4 DAYS Performed at [facility], [address] [place], [place] [address]    Report Status PENDING  Incomplete  Urine culture     Status: None   Collection Time: 09/27/19  7:24 PM   Specimen: In/Out Cath Urine  Result Value Ref Range Status   Specimen Description   Final    IN/OUT CATH URINE Performed at [facility], [address], [place], [place] [address]    Special Requests   Final    NONE Performed at [facility], [address] [place], [place] [address]    Culture   Final    NO GROWTH Performed at [facility], [address], [place], [place] [address]    Report Status 09/28/2019 FINAL  Final      Studies: CT ANGIO CHEST PE W OR WO CONTRAST  Result Date: 09/30/2019 CLINICAL DATA:  86 year old female with hypoxemia. EXAM: CT ANGIOGRAPHY CHEST WITH CONTRAST TECHNIQUE: Multidetector CT imaging of the chest was performed using the standard protocol during bolus administration of intravenous contrast. Multiplanar CT image reconstructions and MIPs were obtained to evaluate the vascular anatomy. CONTRAST:  45mL OMNIPAQUE IOHEXOL 350 MG/ML SOLN COMPARISON:  Chest radiograph dated 09/27/2019 FINDINGS: Cardiovascular: There is mild cardiomegaly. There is retrograde flow of contrast from the right atrium into the IVC in keeping with a degree of right heart dysfunction. No pericardial effusion. There is coronary vascular calcification. There is moderate atherosclerotic calcification of the thoracic aorta. No aneurysmal dilatation or evidence of dissection. Evaluation of the pulmonary arteries is somewhat limited due to respiratory motion artifact and suboptimal visualization of the peripheral branches. No central pulmonary artery embolus identified. Mediastinum/Nodes: No definite hilar or mediastinal adenopathy. Evaluation however is very limited due to consolidative changes of the left lung and respiratory  motion artifact. Lungs/Pleura: Moderate bilateral pleural effusions, left greater right. There is consolidative changes of the majority of the left lower lobe as well as partial consolidative changes of the right lower lobe. Multiple clusters of ground-glass opacity in the upper  lobes bilaterally noted. Findings most consistent with multifocal pneumonia. Clinical correlation and follow-up to resolution recommended. There is no pneumothorax. There is tracheal malacia. The central airways remain patent. Upper Abdomen: No acute abnormality. Musculoskeletal: Degenerative changes of the spine. No acute osseous pathology. Review of the MIP images confirms the above findings. IMPRESSION: 1. No large central pulmonary artery embolus. 2. Moderate bilateral pleural effusions with consolidative changes of the majority of the left lower lobe and findings of multifocal pneumonia. Clinical correlation and follow-up to resolution recommended. 3. Coronary vascular calcification and Aortic Atherosclerosis (ICD10-I70.0). Electronically Signed   By: Elgie Collard M.D.   On: 09/30/2019 18:34   DG CHEST PORT 1 VIEW  Result Date: 10/01/2019 CLINICAL DATA:  Acute shortness of breath EXAM: PORTABLE CHEST 1 VIEW COMPARISON:  09/27/2019 chest radiograph. 09/30/2019 chest CT FINDINGS: Moderate to large LEFT pleural effusion and small to moderate RIGHT pleural effusion again noted. Possible increased since pulmonary vascular congestion noted. RIGHT LOWER lung and diffuse LEFT lung airspace disease noted. LEFT mid and LOWER lung atelectasis/consolidation again noted. No evidence of pneumothorax. No acute bony abnormality noted. IMPRESSION: Question increased pulmonary vascular congestion. Little significant change in bilateral pleural effusions, RIGHT LOWER lung and diffuse LEFT lung airspace disease and LEFT mid and LOWER lung atelectasis/consolidation. Electronically Signed   By: Harmon Pier M.D.   On: 10/01/2019 07:42   VAS [place] LOWER  EXTREMITY VENOUS (DVT)  Result Date: 09/30/2019  Lower Venous DVTStudy Indications: Swelling.  Limitations: Body habitus and poor ultrasound/tissue interface. Comparison Study: No prior study Performing Technologist: Gertie Fey MHA, RDMS, RVT, RDCS  Examination Guidelines: A complete evaluation includes B-mode imaging, spectral Doppler, color Doppler, and power Doppler as needed of all accessible portions of each vessel. Bilateral testing is considered an integral part of a complete examination. Limited examinations for reoccurring indications may be performed as noted. The reflux portion of the exam is performed with the patient in reverse Trendelenburg.  +---------+---------------+---------+-----------+----------+--------------+ RIGHT    CompressibilityPhasicitySpontaneityPropertiesThrombus Aging +---------+---------------+---------+-----------+----------+--------------+ CFV      Full           Yes      Yes                                 +---------+---------------+---------+-----------+----------+--------------+ SFJ      Full                                                        +---------+---------------+---------+-----------+----------+--------------+ FV Prox  Full                                                        +---------+---------------+---------+-----------+----------+--------------+ FV Mid   Full                                                        +---------+---------------+---------+-----------+----------+--------------+ FV DistalFull                                                        +---------+---------------+---------+-----------+----------+--------------+  PFV      Full                                                        +---------+---------------+---------+-----------+----------+--------------+ POP      Full           Yes      Yes                                  +---------+---------------+---------+-----------+----------+--------------+ PTV      Full                                                        +---------+---------------+---------+-----------+----------+--------------+ PERO     Full                                                        +---------+---------------+---------+-----------+----------+--------------+   +---------+---------------+---------+-----------+----------+--------------+ LEFT     CompressibilityPhasicitySpontaneityPropertiesThrombus Aging +---------+---------------+---------+-----------+----------+--------------+ CFV      Full           Yes      Yes                                 +---------+---------------+---------+-----------+----------+--------------+ SFJ      Full                                                        +---------+---------------+---------+-----------+----------+--------------+ FV Prox  Full                                                        +---------+---------------+---------+-----------+----------+--------------+ FV Mid   Full                                                        +---------+---------------+---------+-----------+----------+--------------+ FV DistalFull                                                        +---------+---------------+---------+-----------+----------+--------------+ PFV      Full                                                        +---------+---------------+---------+-----------+----------+--------------+  POP      Full           Yes      Yes                                 +---------+---------------+---------+-----------+----------+--------------+ PTV      Full                                                        +---------+---------------+---------+-----------+----------+--------------+ PERO     Full                                                         +---------+---------------+---------+-----------+----------+--------------+     Summary: RIGHT: - There is no evidence of deep vein thrombosis in the lower extremity.  - No cystic structure found in the popliteal fossa.  LEFT: - There is no evidence of deep vein thrombosis in the lower extremity.  - No cystic structure found in the popliteal fossa.  *See table(s) above for measurements and observations.    Preliminary     Scheduled Meds: . amLODipine  10 mg Oral Daily   And  . irbesartan  300 mg Oral Daily  . aspirin  81 mg Oral Daily  . clopidogrel  75 mg Oral Q breakfast  . dextromethorphan-guaiFENesin  1 tablet Oral BID  . docusate sodium  100 mg Oral Daily  . donepezil  10 mg Oral QHS  . enoxaparin (LOVENOX) injection  40 mg Subcutaneous Q24H  . escitalopram  5 mg Oral Daily  . furosemide  20 mg Intravenous Q12H  . insulin aspart  0-9 Units Subcutaneous TID WC  . ipratropium  0.5 mg Nebulization Q6H  . ketorolac  1 drop Both Eyes Q2000  . levalbuterol  0.63 mg Nebulization Q6H  . metoprolol succinate  37.5 mg Oral Daily  . mometasone-formoterol  2 puff Inhalation BID  . potassium chloride  40 mEq Oral BID  . rosuvastatin  5 mg Oral q1800  . sodium chloride flush  3 mL Intravenous Q12H    Continuous Infusions: . azithromycin Stopped (09/30/19 2136)  . cefTRIAXone (ROCEPHIN)  IV Stopped (09/30/19 1946)     LOS: 4 days     Darlin Droparole N Kaiah Hosea, MD [facility] Pager [phone]  If 7PM-7AM, please contact night-coverage [email or web address] Password [email or web address] 10/01/2019, 1:13 PM

## 2019-10-01 NOTE — Plan of Care (Signed)

## 2019-10-01 NOTE — Progress Notes (Signed)
Physical Therapy Treatment Patient Details Name: [name] MRN: [id number] DOB: [birth year] Today's Date: 10/01/2019    History of Present Illness 86 yo female admitted with Pna, weakness, sepsis. Hx of dementia, CVA, Dm, asthma, incontinence    PT Comments    Pt in bed on 3 lts at 94%.  General bed mobility comments: verbal/tactile cues for sequencing reaching UE's and assist to bring LE's off EOB and press up to raise trunk and sit up to EOB. General transfer comment: min assist for power up and to steady with rising to RW from EOB and BSC. 1HHA to stand step/pivot to [employer]. pt taking small steps with poor foot clearance.General Gait Details: pt with safe hand placement on RW. cues for safe step pattern and to maintain safe proximity to RW throughout and for pt to look up for improved posture. Min assist to steady. pt abel to sequence turn and take backwards steps towards chair with assist to manage RW. Positioned in recliner to comfort.    Follow Up Recommendations  Home health PT     Equipment Recommendations  None recommended by PT    Recommendations for Other Services       Precautions / Restrictions Precautions Precautions: Fall    Mobility  Bed Mobility Overal bed mobility: Needs Assistance Bed Mobility: Supine to Sit     Supine to sit: Min assist;HOB elevated     General bed mobility comments: verbal/tactile cues for sequencing reaching UE's and assist to bring LE's off EOB and press up to raise trunk and sit up to EOB.  Transfers Overall transfer level: Needs assistance Equipment used: 1 person hand held assist;Rolling walker (2 wheeled) Transfers: Sit to/from [employer] Sit to Stand: Min assist Stand pivot transfers: Min assist       General transfer comment: min assist for power up and to steady with rising to RW from EOB and BSC. 1HHA to stand step/pivot to [facility]. pt taking small steps with poor foot  clearance.  Ambulation/Gait Ambulation/Gait assistance: Min assist Gait Distance (Feet): 8 Feet Assistive device: Rolling walker (2 wheeled) Gait Pattern/deviations: Step-to pattern;Decreased stride length;Trunk flexed;Shuffle Gait velocity: slow   General Gait Details: pt with safe hand placement on RW. cues for safe step pattern and to maintain safe proximity to RW throughout and for pt to look up for improved posture. Min assist to steady. pt abel to sequence turn and take backwards steps towards chair with assist to manage RW.   Stairs             Wheelchair Mobility    Modified Rankin (Stroke Patients Only)       Balance                                            Cognition Arousal/Alertness: Awake/alert Behavior During Therapy: WFL for tasks assessed/performed Overall Cognitive Status: History of cognitive impairments - at baseline                                 General Comments: following all commands and speaks enough [place].      Exercises      General Comments        Pertinent Vitals/Pain Pain Assessment: No/denies pain    Home Living  Prior Function            PT Goals (current goals can now be found in the care plan section) Progress towards PT goals: Progressing toward goals    Frequency    Min 3X/week      PT Plan Current plan remains appropriate    Co-evaluation              AM-PAC PT "6 Clicks" Mobility   Outcome Measure  Help needed turning from your back to your side while in a flat bed without using bedrails?: A Little Help needed moving from lying on your back to sitting on the side of a flat bed without using bedrails?: A Little Help needed moving to and from a bed to a chair (including a wheelchair)?: A Little Help needed standing up from a chair using your arms (e.g., wheelchair or bedside chair)?: A Little Help needed to walk in hospital room?: A  Little Help needed climbing 3-5 steps with a railing? : A Lot 6 Click Score: 17    End of Session Equipment Utilized During Treatment: Gait belt Activity Tolerance: Patient tolerated treatment well Patient left: in chair;with call bell/phone within reach;with chair alarm set Nurse Communication: Mobility status PT Visit Diagnosis: Muscle weakness (generalized) (M62.81);Difficulty in walking, not elsewhere classified (R26.2)     Time: 6629-4765 PT Time Calculation (min) (ACUTE ONLY): 27 min  Charges:  $Gait Training: 8-22 mins $Therapeutic Activity: 8-22 mins                     Rica Koyanagi  PTA Acute  Rehabilitation Services Pager      [phone] Office      [phone]

## 2019-10-01 NOTE — Progress Notes (Signed)
Pt has a baseline of dementia.  It would be beneficial to pt and staff if pt's son stayed overnight with pt. Son is at the bedside at this time.  Pt's son has had both doses of the covid vaccine.  This overnight stay has been approved by nurse, charge nurse and [facility].

## 2019-10-01 NOTE — Progress Notes (Signed)
OT Cancellation Note  Patient Details Name: [name] MRN: [id number] DOB: [birth year]   Cancelled Treatment:     Per RN and observation, pt presents with agitation limiting full participation for session. OT will follow up at a later time to complete OT evaluation.   Marquette Old, MSOT, OTR/L  Supplemental Rehabilitation Services  [phone]   [name] 10/01/2019, 12:25 PM

## 2019-10-02 ENCOUNTER — Inpatient Hospital Stay (HOSPITAL_COMMUNITY): Payer: Medicare Other

## 2019-10-02 DIAGNOSIS — I10 Essential (primary) hypertension: Secondary | ICD-10-CM

## 2019-10-02 LAB — MAGNESIUM: Magnesium: 1.9 mg/dL (ref 1.7–2.4)

## 2019-10-02 LAB — GLUCOSE, CAPILLARY
Glucose-Capillary: 208 mg/dL — ABNORMAL HIGH (ref 70–99)
Glucose-Capillary: 149 mg/dL — ABNORMAL HIGH (ref 70–99)
Glucose-Capillary: 215 mg/dL — ABNORMAL HIGH (ref 70–99)
Glucose-Capillary: 198 mg/dL — ABNORMAL HIGH (ref 70–99)

## 2019-10-02 LAB — CULTURE, BLOOD (ROUTINE X 2)
Culture: NO GROWTH
Culture: NO GROWTH
Special Requests: ADEQUATE

## 2019-10-02 LAB — BODY FLUID CELL COUNT WITH DIFFERENTIAL
Eos, Fluid: 0 %
Lymphs, Fluid: 56 %
Monocyte-Macrophage-Serous Fluid: 12 % — ABNORMAL LOW (ref 50–90)
Neutrophil Count, Fluid: 32 % — ABNORMAL HIGH (ref 0–25)
Total Nucleated Cell Count, Fluid: 344 cu mm (ref 0–1000)

## 2019-10-02 LAB — CBC WITH DIFFERENTIAL/PLATELET
Abs Immature Granulocytes: 0.61 10*3/uL — ABNORMAL HIGH (ref 0.00–0.07)
Basophils Absolute: 0.2 10*3/uL — ABNORMAL HIGH (ref 0.0–0.1)
Basophils Relative: 2 %
Eosinophils Absolute: 0.1 10*3/uL (ref 0.0–0.5)
Eosinophils Relative: 1 %
HCT: 39.5 % (ref 36.0–46.0)
Hemoglobin: 12.9 g/dL (ref 12.0–15.0)
Immature Granulocytes: 6 %
Lymphocytes Relative: 12 %
Lymphs Abs: 1.2 10*3/uL (ref 0.7–4.0)
MCH: 29.1 pg (ref 26.0–34.0)
MCHC: 32.7 g/dL (ref 30.0–36.0)
MCV: 89 fL (ref 80.0–100.0)
Monocytes Absolute: 1.3 10*3/uL — ABNORMAL HIGH (ref 0.1–1.0)
Monocytes Relative: 13 %
Neutro Abs: 6.6 10*3/uL (ref 1.7–7.7)
Neutrophils Relative %: 66 %
Platelets: 297 10*3/uL (ref 150–400)
RBC: 4.44 MIL/uL (ref 3.87–5.11)
RDW: 14.9 % (ref 11.5–15.5)
WBC: 10 10*3/uL (ref 4.0–10.5)
nRBC: 0 % (ref 0.0–0.2)

## 2019-10-02 LAB — BASIC METABOLIC PANEL
Anion gap: 9 (ref 5–15)
BUN: 13 mg/dL (ref 8–23)
CO2: 30 mmol/L (ref 22–32)
Calcium: 8.5 mg/dL — ABNORMAL LOW (ref 8.9–10.3)
Chloride: 102 mmol/L (ref 98–111)
Creatinine, Ser: 0.61 mg/dL (ref 0.44–1.00)
GFR calc Af Amer: >60 mL/min (ref >60)
GFR calc non Af Amer: >60 mL/min (ref >60)
Glucose, Bld: 143 mg/dL — ABNORMAL HIGH (ref 70–99)
Potassium: 3.9 mmol/L (ref 3.5–5.1)
Sodium: 141 mmol/L (ref 135–145)

## 2019-10-02 LAB — PHOSPHORUS: Phosphorus: 2.6 mg/dL (ref 2.5–4.6)

## 2019-10-02 LAB — GLUCOSE, PLEURAL OR PERITONEAL FLUID: Glucose, Fluid: 203 mg/dL

## 2019-10-02 LAB — ALBUMIN, PLEURAL OR PERITONEAL FLUID: Albumin, Fluid: 1.3 g/dL

## 2019-10-02 LAB — TROPONIN I (HIGH SENSITIVITY)
Troponin I (High Sensitivity): 50 ng/L — ABNORMAL HIGH (ref <18)
Troponin I (High Sensitivity): 42 ng/L — ABNORMAL HIGH (ref <18)

## 2019-10-02 LAB — LACTATE DEHYDROGENASE, PLEURAL OR PERITONEAL FLUID: LD, Fluid: 289 U/L — ABNORMAL HIGH (ref 3–23)

## 2019-10-02 MED ORDER — FUROSEMIDE 10 MG/ML IJ SOLN
40.0000 mg | Freq: Two times a day (BID) | INTRAMUSCULAR | Status: DC
  Administered 2019-10-02 – 2019-10-03 (×2): 40 mg via INTRAVENOUS
  Filled 2019-10-02 (×3): qty 4

## 2019-10-02 MED ORDER — LIDOCAINE HCL 1 % IJ SOLN
INTRAMUSCULAR | Status: AC
  Filled 2019-10-02: qty 20

## 2019-10-02 MED ORDER — HYDROCOD POLST-CPM POLST ER 10-8 MG/5ML PO SUER
5.0000 mL | Freq: Two times a day (BID) | ORAL | Status: DC
  Administered 2019-10-02 (×2): 5 mL via ORAL
  Administered 2019-10-03: 1 mL via ORAL
  Filled 2019-10-02 (×3): qty 5

## 2019-10-02 MED ORDER — GLUCERNA SHAKE PO LIQD
237.0000 mL | Freq: Three times a day (TID) | ORAL | Status: DC
  Administered 2019-10-02 – 2019-10-04 (×6): 237 mL via ORAL
  Filled 2019-10-02 (×8): qty 237

## 2019-10-02 NOTE — Procedures (Signed)
Interventional Radiology Procedure Note  Procedure: US guided left thoracentesis. ~570cc of amber fluid removed for sample  Complications: None Recommendations:  - CXR now - Ok to participate today with PT/OT - Do not submerge for 7 days - Routine care   Signed,  Yvone Neu. Loreta Ave, DO

## 2019-10-02 NOTE — Consult Note (Signed)
Referring Physician: Dow AdolphHall, Carole, MD/Harwani  Ruth D Scarlette ArChopra is an 86 y.o. female.                       Chief Complaint: Abnormal hs-troponin I and EKG  HPI: 86 years old asian female with PMH of type 2 DM, dementia, ischemic stroke, asthma, urinary incontinence had sepsis due to left lower lobe pneumonia. Her COVID test was negative. She had pulmonary edema and bilateral pleural effusion improved with diuresis. Her echocardiogram shows hyperdynamic LV systolic function and mild diastolic dysfunction. She had CT angiogram that was negative for PE. She had minimally elevated HS-Troponin I suggestive of demand ischemia. Her EKG shows sinus rhythm with LVH with repolarization abnormality and possible anterior and inferior infarcts. Her left lung pneumonia is improving and she has good oxygen saturation with 2 L/min oxygen by [place]. She had 570 cc of fluid recovered by left sided thoracentesis.  Past Medical History:  Diagnosis Date  . Diabetes (HCC)   . Hearing loss   . Thalamic infarction [facility](HCC)       Past Surgical History:  Procedure Laterality Date  . fractured rib      Family History  Problem Relation Age of Onset  . Cancer Mother   . Heart disease Sister   . Diabetes Other   . Stroke Other    Social History:  reports that she has never smoked. She has never used smokeless tobacco. She reports that she does not drink alcohol or use drugs.  Allergies:  Allergies  Allergen Reactions  . Sulfonamide Derivatives Other (See Comments)    Nervous, agitation    Medications Prior to Admission  Medication Sig Dispense Refill  . amLODipine-olmesartan (AZOR) 5-40 MG tablet Take 1 tablet by mouth daily.     Marland Kitchen. aspirin 81 MG chewable tablet Chew 81 mg by mouth daily.    . carbamide peroxide (GNP EARWAX REMOVAL DROPS) 6.5 % OTIC solution Place 5 drops into both ears 2 (two) times daily as needed (ear wax buildup).    . Cholecalciferol 25 MCG (1000 UT) capsule Take 1,000 Units by mouth  daily.     . clopidogrel (PLAVIX) 75 MG tablet TAKE 1 TABLET BY MOUTH DAILY (Patient taking differently: Take 75 mg by mouth daily. ) 30 tablet 0  . docusate sodium (COLACE) 100 MG capsule Take 100 mg by mouth daily.    Marland Kitchen. donepezil (ARICEPT) 5 MG tablet Take 10 mg by mouth at bedtime.     Marland Kitchen. escitalopram (LEXAPRO) 5 MG tablet Take 5 mg by mouth daily.    . Ferrous Sulfate (IRON) 325 (65 Fe) MG TABS Take 650 mg by mouth daily.    . Fluticasone Propionate, Inhal, (FLOVENT DISKUS) 250 MCG/BLIST AEPB Inhale 1 puff into the lungs 2 (two) times daily.    Marland Kitchen. loratadine (CLARITIN) 10 MG tablet Take 10 mg by mouth daily as needed for allergies.     . metoprolol succinate (TOPROL-XL) 25 MG 24 hr tablet Take 25 mg by mouth daily.     . Multiple Vitamin (MULTIVITAMIN) tablet Take 1 tablet by mouth daily.    Marland Kitchen. MYRBETRIQ 50 MG TB24 tablet Take 50 mg by mouth daily.     Marland Kitchen. PROAIR HFA 108 (90 BASE) MCG/ACT inhaler Inhale 2 puffs into the lungs every 4 (four) hours as needed for wheezing.   6  . Psyllium (KONSYL DAILY FIBER) 28.3 % PACK Take 1 each by mouth daily.    . rosuvastatin (  CRESTOR) 5 MG tablet Take 5 mg by mouth daily.    . mometasone-formoterol (DULERA) 100-5 MCG/ACT AERO Inhale 2 puffs into the lungs 2 (two) times daily. (Patient not taking: Reported on 08/15/2017) 1 Inhaler 0  . Spacer/Aero-Holding Chambers (AEROCHAMBER MV) inhaler Use as instructed 1 each 0    Results for orders placed or performed during the hospital encounter of 09/27/19 (from the past 48 hour(s))  CBC with Differential/Platelet     Status: Abnormal   Collection Time: 10/01/19  5:12 AM  Result Value Ref Range   WBC 10.5 4.0 - 10.5 K/uL   RBC 4.28 3.87 - 5.11 MIL/uL   Hemoglobin 12.5 12.0 - 15.0 g/dL   HCT 50.5 39.7 - 67.3 %   MCV 90.0 80.0 - 100.0 fL   MCH 29.2 26.0 - 34.0 pg   MCHC 32.5 30.0 - 36.0 g/dL   RDW 41.9 37.9 - 02.4 %   Platelets 270 150 - 400 K/uL   nRBC 0.0 0.0 - 0.2 %   Neutrophils Relative % 77 %   Neutro  Abs 8.0 (H) 1.7 - 7.7 K/uL   Lymphocytes Relative 9 %   Lymphs Abs 1.0 0.7 - 4.0 K/uL   Monocytes Relative 8 %   Monocytes Absolute 0.8 0.1 - 1.0 K/uL   Eosinophils Relative 0 %   Eosinophils Absolute 0.0 0.0 - 0.5 K/uL   Basophils Relative 0 %   Basophils Absolute 0.0 0.0 - 0.1 K/uL   Immature Granulocytes 6 %   Abs Immature Granulocytes 0.65 (H) 0.00 - 0.07 K/uL   Reactive, Benign Lymphocytes PRESENT    Polychromasia PRESENT     Comment: Performed at [facility], [address], [place], [place] [address]  Basic metabolic panel     Status: Abnormal   Collection Time: 10/01/19  5:12 AM  Result Value Ref Range   Sodium 141 135 - 145 mmol/L   Potassium 3.4 (L) 3.5 - 5.1 mmol/L   Chloride 102 98 - 111 mmol/L   CO2 24 22 - 32 mmol/L   Glucose, Bld 133 (H) 70 - 99 mg/dL   BUN 11 8 - 23 mg/dL   Creatinine, Ser 3.29 0.44 - 1.00 mg/dL   Calcium 7.9 (L) 8.9 - 10.3 mg/dL   GFR calc non Af Amer >60 >60 mL/min   GFR calc Af Amer >60 >60 mL/min   Anion gap 15 5 - 15    Comment: Performed at [facility], [address], [place], [place] [address]  Glucose, capillary     Status: Abnormal   Collection Time: 10/01/19  7:49 AM  Result Value Ref Range   Glucose-Capillary 160 (H) 70 - 99 mg/dL  Glucose, capillary     Status: Abnormal   Collection Time: 10/01/19 11:42 AM  Result Value Ref Range   Glucose-Capillary 229 (H) 70 - 99 mg/dL  Glucose, capillary     Status: Abnormal   Collection Time: 10/01/19  4:41 PM  Result Value Ref Range   Glucose-Capillary 208 (H) 70 - 99 mg/dL  Glucose, capillary     Status: Abnormal   Collection Time: 10/01/19  8:54 PM  Result Value Ref Range   Glucose-Capillary 186 (H) 70 - 99 mg/dL  Basic metabolic panel     Status: Abnormal   Collection Time: 10/02/19  5:49 AM  Result Value Ref Range   Sodium 141 135 - 145 mmol/L   Potassium 3.9 3.5 - 5.1 mmol/L   Chloride 102  98 - 111 mmol/L   CO2 30 22 - 32 mmol/L    Glucose, Bld 143 (H) 70 - 99 mg/dL   BUN 13 8 - 23 mg/dL   Creatinine, Ser 0.17 0.44 - 1.00 mg/dL   Calcium 8.5 (L) 8.9 - 10.3 mg/dL   GFR calc non Af Amer >60 >60 mL/min   GFR calc Af Amer >60 >60 mL/min   Anion gap 9 5 - 15    Comment: Performed at [facility], [address], [place], [place] [address]  CBC with Differential/Platelet     Status: Abnormal   Collection Time: 10/02/19  5:49 AM  Result Value Ref Range   WBC 10.0 4.0 - 10.5 K/uL   RBC 4.44 3.87 - 5.11 MIL/uL   Hemoglobin 12.9 12.0 - 15.0 g/dL   HCT 85.2 77.8 - 24.2 %   MCV 89.0 80.0 - 100.0 fL   MCH 29.1 26.0 - 34.0 pg   MCHC 32.7 30.0 - 36.0 g/dL   RDW 35.3 61.4 - 43.1 %   Platelets 297 150 - 400 K/uL   nRBC 0.0 0.0 - 0.2 %   Neutrophils Relative % 66 %   Neutro Abs 6.6 1.7 - 7.7 K/uL   Lymphocytes Relative 12 %   Lymphs Abs 1.2 0.7 - 4.0 K/uL   Monocytes Relative 13 %   Monocytes Absolute 1.3 (H) 0.1 - 1.0 K/uL   Eosinophils Relative 1 %   Eosinophils Absolute 0.1 0.0 - 0.5 K/uL   Basophils Relative 2 %   Basophils Absolute 0.2 (H) 0.0 - 0.1 K/uL   WBC Morphology MILD LEFT SHIFT (1-5% METAS, OCC MYELO, OCC BANDS)    Immature Granulocytes 6 %   Abs Immature Granulocytes 0.61 (H) 0.00 - 0.07 K/uL   Reactive, Benign Lymphocytes PRESENT     Comment: Performed at [facility], [address], [place], [place] [address]  Magnesium     Status: None   Collection Time: 10/02/19  5:49 AM  Result Value Ref Range   Magnesium 1.9 1.7 - 2.4 mg/dL    Comment: Performed at [facility], [address] [place], [place] [address]  Phosphorus     Status: None   Collection Time: 10/02/19  5:49 AM  Result Value Ref Range   Phosphorus 2.6 2.5 - 4.6 mg/dL    Comment: Performed at [facility], [address], [place], [place] [address]  Glucose, capillary     Status: Abnormal   Collection Time: 10/02/19  7:45 AM  Result Value Ref Range    Glucose-Capillary 208 (H) 70 - 99 mg/dL  Lactate dehydrogenase (pleural or peritoneal fluid)     Status: Abnormal   Collection Time: 10/02/19 10:39 AM  Result Value Ref Range   LD, Fluid 289 (H) 3 - 23 U/L    Comment: (NOTE) Results should be evaluated in conjunction with serum values    Fluid Type-FLDH CYTO PLEU     Comment: Performed at [facility], [address], [place], [place] [address]  Body fluid cell count with differential     Status: Abnormal   Collection Time: 10/02/19 10:39 AM  Result Value Ref Range   Fluid Type-FCT CYTO PLEU    Color, Fluid STRAW (A) YELLOW   Appearance, Fluid HAZY (A) CLEAR   Total Nucleated Cell Count, Fluid 344 0 - 1,000 cu mm   Neutrophil Count, Fluid 32 (H) 0 - 25 %   Lymphs,  Fluid 56 %   Monocyte-Macrophage-Serous Fluid 12 (L) 50 - 90 %   Eos, Fluid 0 %   Other Cells, Fluid OTHER CELLS IDENTIFIED AS MESOTHELIAL CELLS %    Comment: CORRELATE WITH CYTOLOGY. Performed at [facility], [address] [place], [place] [address]   Albumin, pleural or peritoneal fluid     Status: None   Collection Time: 10/02/19 10:39 AM  Result Value Ref Range   Albumin, Fluid 1.3 g/dL    Comment: (NOTE) No normal range established for this test Results should be evaluated in conjunction with serum values    Fluid Type-FALB CYTO PLEU     Comment: Performed at [facility], [address] [place], [place] [address]  Glucose, pleural or peritoneal fluid     Status: None   Collection Time: 10/02/19 10:39 AM  Result Value Ref Range   Glucose, Fluid 203 mg/dL    Comment: (NOTE) No normal range established for this test Results should be evaluated in conjunction with serum values    Fluid Type-FGLU CYTO PLEU     Comment: Performed at [facility], [address] [place], [place] [address]  Glucose, capillary     Status: Abnormal   Collection Time: 10/02/19 12:00 PM  Result Value  Ref Range   Glucose-Capillary 149 (H) 70 - 99 mg/dL  Glucose, capillary     Status: Abnormal   Collection Time: 10/02/19  4:37 PM  Result Value Ref Range   Glucose-Capillary 215 (H) 70 - 99 mg/dL  Troponin I (High Sensitivity)     Status: Abnormal   Collection Time: 10/02/19  6:49 PM  Result Value Ref Range   Troponin I (High Sensitivity) 50 (H) <18 ng/L    Comment: (NOTE) Elevated high sensitivity troponin I (hsTnI) values and significant  changes across serial measurements may suggest ACS but many other  chronic and acute conditions are known to elevate hsTnI results.  Refer to the "Links" section for chest pain algorithms and additional  guidance. Performed at [facility], [address] [place], [place] [address]    DG Chest Port 1 View  Result Date: 10/02/2019 CLINICAL DATA:  Status post left thoracentesis EXAM: PORTABLE CHEST 1 VIEW COMPARISON:  Earlier same day FINDINGS: 10:31 a.m. lungs appear hyperexpanded. Interstitial markings are diffusely coarsened with chronic features. Bibasilar collapse/consolidative change noted with bilateral pleural effusions, left greater than right. Left pleural effusion has certainly decreased since the film earlier today and there is no evidence for pneumothorax status post thoracentesis. Bones are diffusely demineralized. Telemetry leads overlie the chest. IMPRESSION: Interval decrease in left pleural effusion status post thoracentesis. No evidence for pneumothorax. Electronically Signed   By: Kennith CenterEric  Mansell M.D.   On: 10/02/2019 12:54   DG Chest Port 1 View  Result Date: 10/02/2019 CLINICAL DATA:  Pleural effusion EXAM: PORTABLE CHEST 1 VIEW COMPARISON:  10/01/2019 and prior exams FINDINGS: Cardiomediastinal silhouette is unchanged. Slightly improved lung volumes noted. Bilateral pleural effusions and bilateral airspace opacities, LEFT-greater-than-RIGHT are again noted. Little significant change from the prior study. IMPRESSION:  Unchanged appearance of the chest with bilateral pleural effusions and airspace opacities. Electronically Signed   By: Harmon PierJeffrey  Hu M.D.   On: 10/02/2019 05:58   DG CHEST PORT 1 VIEW  Result Date: 10/01/2019 CLINICAL DATA:  Acute shortness of breath EXAM: PORTABLE CHEST 1 VIEW COMPARISON:  09/27/2019 chest radiograph. 09/30/2019 chest CT FINDINGS: Moderate to large LEFT pleural effusion and small to  moderate RIGHT pleural effusion again noted. Possible increased since pulmonary vascular congestion noted. RIGHT LOWER lung and diffuse LEFT lung airspace disease noted. LEFT mid and LOWER lung atelectasis/consolidation again noted. No evidence of pneumothorax. No acute bony abnormality noted. IMPRESSION: Question increased pulmonary vascular congestion. Little significant change in bilateral pleural effusions, RIGHT LOWER lung and diffuse LEFT lung airspace disease and LEFT mid and LOWER lung atelectasis/consolidation. Electronically Signed   By: Harmon Pier M.D.   On: 10/01/2019 07:42   ECHOCARDIOGRAM COMPLETE  Result Date: 10/01/2019   ECHOCARDIOGRAM REPORT   Patient Name:   [name] Date of Exam: 10/01/2019 Medical Rec #:  [id number]        Height:       63.0 in Accession #:    [id number]       Weight:       138.0 lb Date of Birth:  June 08, 1923        BSA:          1.65 m Patient Age:    86 years         BP:           153/70 mmHg Patient Gender: F                HR:           106 bpm. Exam Location:  Inpatient Procedure: 2D Echo Indications:    Elevated Troponin  History:        Patient has prior history of Echocardiogram examinations, most                 recent 09/27/2010. CAD; Risk Factors:Hypertension and                 Dyslipidemia. Severe sepsis.  Sonographer:    Leeroy Bock Turrentine Referring Phys: [id number] Darlin Drop  Sonographer Comments: Image acquisition challenging due to uncooperative patient and Image acquisition challenging due to respiratory motion. IMPRESSIONS  1. Left ventricular ejection  fraction, by visual estimation, is 70 to 75%. The left ventricle has hyperdynamic function. There is no left ventricular hypertrophy.  2. Left ventricular diastolic parameters are consistent with Grade I diastolic dysfunction (impaired relaxation).  3. The left ventricle has no regional wall motion abnormalities.  4. Global right ventricle has normal systolic function.The right ventricular size is normal. No increase in right ventricular wall thickness.  5. Left atrial size was normal.  6. Right atrial size was normal.  7. Moderate mitral annular calcification.  8. The mitral valve is grossly normal. No evidence of mitral valve regurgitation.  9. The tricuspid valve is grossly normal. 10. The tricuspid valve is grossly normal. Tricuspid valve regurgitation is trivial. 11. The aortic valve was not well visualized. Aortic valve regurgitation is mild. 12. The pulmonic valve was not well visualized. Pulmonic valve regurgitation is not visualized. 13. The inferior vena cava is normal in size with greater than 50% respiratory variability, suggesting right atrial pressure of 3 mmHg. 14. The interatrial septum was not well visualized. FINDINGS  Left Ventricle: Left ventricular ejection fraction, by visual estimation, is 70 to 75%. The left ventricle has hyperdynamic function. The left ventricle has no regional wall motion abnormalities. The left ventricular internal cavity size was the left ventricle is normal in size. There is no left ventricular hypertrophy. Left ventricular diastolic parameters are consistent with Grade I diastolic dysfunction (impaired relaxation). Right Ventricle: The right ventricular size is normal. No increase in right ventricular wall thickness. Global RV  systolic function is has normal systolic function. Left Atrium: Left atrial size was normal in size. Right Atrium: Right atrial size was normal in size Pericardium: There is no evidence of pericardial effusion. Mitral Valve: The mitral valve is  grossly normal. Moderate mitral annular calcification. No evidence of mitral valve regurgitation. Tricuspid Valve: The tricuspid valve is grossly normal. Tricuspid valve regurgitation is trivial. Aortic Valve: The aortic valve was not well visualized. Aortic valve regurgitation is mild. Aortic valve mean gradient measures 6.0 mmHg. Aortic valve peak gradient measures 10.1 mmHg. Pulmonic Valve: The pulmonic valve was not well visualized. Pulmonic valve regurgitation is not visualized. Pulmonic regurgitation is not visualized. Aorta: The aortic root is normal in size and structure. Venous: The inferior vena cava is normal in size with greater than 50% respiratory variability, suggesting right atrial pressure of 3 mmHg. IAS/Shunts: The interatrial septum was not well visualized.  LEFT VENTRICLE PLAX 2D LVIDd:         2.80 cm LVIDs:         1.60 cm LV PW:         0.80 cm LV IVS:        0.90 cm LVOT diam:     1.70 cm LV SV:         22 ml LV SV Index:   13.34 LVOT Area:     2.27 cm  RIGHT VENTRICLE RV S prime:     14.30 cm/s TAPSE (M-mode): 1.5 cm LEFT ATRIUM           Index       RIGHT ATRIUM           Index LA diam:      3.40 cm 2.06 cm/m  RA Area:     14.20 cm LA Vol (A4C): 48.7 ml 29.49 ml/m RA Volume:   36.10 ml  21.86 ml/m  AORTIC VALVE AV Vmax:      159.00 cm/s AV Vmean:     120.000 cm/s AV VTI:       0.282 m AV Peak Grad: 10.1 mmHg AV Mean Grad: 6.0 mmHg  AORTA Ao Root diam: 2.50 cm  SHUNTS Systemic Diam: 1.70 cm  Kate Sable MD Electronically signed by Kate Sable MD Signature Date/Time: 10/01/2019/1:45:15 PM    Final    US THORACENTESIS ASP PLEURAL SPACE W/IMG GUIDE  Result Date: 10/02/2019 INDICATION: 86 year old female with left-sided pleural effusion and pneumonia EXAM: ULTRASOUND GUIDED LEFT THORACENTESIS MEDICATIONS: None. COMPLICATIONS: None PROCEDURE: An ultrasound guided thoracentesis was thoroughly discussed with the patient and questions answered. The benefits, risks, alternatives and  complications were also discussed. The patient understands and wishes to proceed with the procedure. Written consent was obtained. Ultrasound was performed to localize and mark an adequate pocket of fluid in the left chest. The area was then prepped and draped in the normal sterile fashion. 1% Lidocaine was used for local anesthesia. Under ultrasound guidance a 8 Fr Safe-T-Centesis catheter was introduced. Thoracentesis was performed. The catheter was removed and a dressing applied. FINDINGS: A total of approximately 570 cc of thin amber fluid was removed. Samples were sent to the laboratory as requested by the clinical team. IMPRESSION: Status post ultrasound-guided left-sided thoracentesis. Signed, Dulcy Fanny. Dellia Nims, RPVI [facility] Electronically Signed   By: Corrie Mckusick D.O.   On: 10/02/2019 11:36    Review Of Systems Constitutional: Positive fever, chills, weight loss or gain. Eyes: No vision change, wears glasses. No discharge or pain.  Ears: Positive hearing loss, No tinnitus. Respiratory: No asthma, COPD, pneumonias. No shortness of breath. No hemoptysis. Cardiovascular: Positive chest pain, palpitation, leg edema. Gastrointestinal: No nausea, vomiting, diarrhea, constipation. No GI bleed. No hepatitis. Genitourinary: No dysuria, hematuria, kidney stone. No incontinance. Neurological: Positive headache, stroke, no seizures.  Psychiatry: No psych facility admission for anxiety, depression, suicide. No detox. Skin: No rash. Musculoskeletal: Positive joint pain, no fibromyalgia. Positive neck pain, back pain. Lymphadenopathy: No lymphadenopathy. Hematology: No anemia or easy bruising.   Blood pressure 140/66, pulse 85, temperature 97.8 F (36.6 C), temperature source Oral, resp. rate 19, height 5\' 3"  (1.6 m), weight 55.7 kg, SpO2 100 %. Body mass index is 21.75 kg/m. General appearance: alert, cooperative, appears stated age  and mild respiratory distress Head: Normocephalic, atraumatic. Eyes: Brown eyes, pink conjunctiva, corneas clear. PERRL, EOM's intact. Neck: No adenopathy, no carotid bruit, no JVD, supple, symmetrical, trachea midline and thyroid not enlarged. Resp: Basal crackles L more than right to auscultation bilaterally. Cardio: Regular rate and rhythm, S1, S2 normal, II/VI systolic murmur, no click, rub or gallop GI: Soft, non-tender; bowel sounds normal; no organomegaly. Extremities: 1 + edema, no cyanosis or clubbing. Skin: Warm and dry.  Neurologic: Alert and oriented X 2, normal strength. Normal coordination.  Assessment/Plan Abnormal HS-Troponin I from demand ischemia Abnormal EKG due to LVH with repolarization abnormality and lead placement. Acute diastolic left heart failure, compensated Left lower lobe pneumonia Bilateral pleural effusions S/P left sided thoracentesis Type 2 DM with hyperglycemia S/P stroke Senile dementia Bilateral hearing loss Asthma Generalized weakness  Discussed with Son, who agrees to pursue medical treatment in this frail woman with multiple medical conditions and advanced age who recently recovered form sepsis and pneumonia. She is high risk for cardiac interventions. Patient and family to discuss code status with Dr. Charmayne Sheer in AM. Continue medical treatment of pneumonia.  Continue Aspirin, Clopidogrel, Metoprolol, Rosuvastatin, potassium and furosemide. Increase activity as tolerated.  Time spent: Review of old records, Lab, x-rays, EKG, other cardiac tests, examination, discussion with patient over 70 minutes.  Sharyn Lull, MD  10/02/2019, 8:31 PM

## 2019-10-02 NOTE — Progress Notes (Signed)
   NAME:  [name], MRN:  [id number], DOB:  [birth year], LOS: 5 ADMISSION DATE:  09/27/2019, CONSULTATION DATE:  10/01/2019 REFERRING MD:  Dr Margo Aye, CHIEF COMPLAINT: Pneumonia, pleural effusion  Brief History   86 year old lady presented with complaints of fever -temperature 101  currently being treated for pneumonia  History of nonproductive cough  Has bilateral pleural effusions No chills or rigors,  She does have a baseline of dementia, type 2 diabetes, ischemic CVA, hypertension, urinary incontinence, history of asthma  Resides in a skilled nursing facility   Past Medical History   Past Medical History:  Diagnosis Date  . Diabetes (HCC)   . Hearing loss   . Thalamic infarction (HCC)      Significant Hospital Events   Thoracentesis 2/7-570 cc of amber-colored fluid from left  Consults:  PCCM 2/6 IR 2/7  Procedures:  Thoracentesis 2/7  Significant Diagnostic Tests:  Hypoxemia-on oxygen supplementation  Micro Data:    Antimicrobials:  Azithromycin >> Rocephin>>  Interim history/subjective:  Cough, shortness of breath Feels a little bit better  Objective   Blood pressure 130/65, pulse 83, temperature 100.3 F (37.9 C), temperature source Oral, resp. rate 20, height 5\' 3"  (1.6 m), weight 55.7 kg, SpO2 99 %.        Intake/Output Summary (Last 24 hours) at 10/02/2019 1204 Last data filed at 10/02/2019 1144 Gross per 24 hour  Intake 598.23 ml  Output 2375 ml  Net -1776.77 ml   Filed Weights   09/30/19 0500 10/01/19 0457 10/02/19 0500  Weight: 61.1 kg 62.6 kg 55.7 kg    Examination: General: Elderly lady, frail, occasional cough HENT: Dry oral mucosa Lungs: Decreased air entry at the bases bilaterally Cardiovascular: S1-S2 appreciated Abdomen: Bowel sounds appreciated Extremities: No clubbing, no edema Neuro: Awake and alert GU: Good output  Resolved Hospital Problem list     Assessment & Plan:  Multilobar pneumonia Sepsis secondary to  pneumonia -On antibiotics azithromycin/Rocephin -Improvement in leukocytosis -Negative blood cultures  Hypoxemic respiratory failure History of asthma -Continue oxygen supplementations -Bronchodilators as needed  Bilateral pleural effusion -Responding well to diuretics -S/p left-sided thoracentesis  Type 2 diabetes -Continue SSI  Electrolyte derangement Hypokalemia-being repleted  Chronic anxiety/depression  Chest x-ray postthoracentesis reviewed-improvement in findings  Decreased dose of Lasix from 40 every 8 to 40 twice a day  As she improves, Lasix may be decreased to once a day and then transition to oral Lasix  Discussed with son at bedside  11/30/19, MD [place], PCCM Cell: [phone]

## 2019-10-02 NOTE — Progress Notes (Signed)
PROGRESS NOTE  ALLANAH MCFARLAND TKP:[id number] DOB: [birth year] DOA: 09/27/2019 PCP: Chilton Greathouse, MD  HPI/Recap of past 24 hours: [name] is a 86 y.o. female with medical history significant for dementia, type 2 diabetes mellitus, ischemic CVA, hypertension, urinary incontinence, asthma, who is admitted to [facility] on 09/27/2019 with severe sepsis due to left lower lobe pneumonia after presenting from ALF to [facility] Emergency Department for evaluation of fever.   Hypoxic on presentation.  Responding well to empiric IV antibiotics.  CTA negative for PE.  Hospital course complicated by pulmonary edema and bilateral pleural effusion in the setting of IV fluid hydration.  Improved with diuresing.  2D echo showed LVEF 70-75% with grade I DD.  Post left thoracentesis with 570 cc of amber fluid removed by interventional radiology.  PCCM following.  Appreciate specialists assistance.  10/02/19: Seen and examined this morning prior to thoracentesis with her son [name] at her bedside.  She was uncomfortable and reported dyspnea while laying in bed.  Also pleuritic pain worse with coughing.  Ordered Tussionex.  Assessment/Plan: Principal Problem:   LLL pneumonia Active Problems:   Essential hypertension   Severe sepsis (HCC)   Fever   Generalized weakness   Dehydration   Sepsis 2/2 LLL CAP Presented with leukocytosis, tachycardia, chest x-ray with findings suggestive of left lower lobe pneumonia.   Reviewed CXR personally infiltrates noted at LLL Continue IV abx empirically IV azithromycin and Rocephin day #5. Continue to maintain O2 sat>92% Procalcitonin is trending down to 2 from 4. Elevated lactic acid 2.9>> 2.7 Blood cx negative to date, continue to follow cx Obtain procalcitonin and lactic acid in the morning.  Acute hypoxic respiratory failure secondary to left lower lobe community-acquired pneumonia complicated by pulmonary edema and bilateral  pleural effusions left greater than right post diuresing and left thoracentesis with improvement. Not on O2 supplementation at baseline Ultrasound-guided left thoracentesis done by interventional radiology Dr. Loreta Ave with 570 cc of amber fluid removed. O2 saturation improved 100% on 2 L. Continue bronchodilators Continue incentive spirometer and flutter valve.  Bilateral pleural effusion left greater than right likely in the setting of pneumonia superimposed by volume overload, iatrogenic from IV fluid hydration Post ultrasound-guided left thoracentesis Management as stated above Responded well to diuresis with good urine output IV Lasix, dose cut down to 40 mg twice daily.  Acute pulmonary edema, likely cardiogenic in the setting of volume overload Continue to closely monitor volume status Currently on IV Lasix 40 mg twice daily and diuresing well No evidence of renal failure  Pleuritic pain in the setting of intractable cough Started Tussionex twice daily x3 days  Elevated troponin likely demand ischemia in the setting of hypoxia Denies any anginal symptoms Troponin peaked at 125 and trended down No sign of acute ischemia on twelve-lead EKG.    Chronic diastolic CHF 2D echo done on 10/01/2019 showed LVEF 70 to 75% with grade 1 diastolic dysfunction Continue to monitor volume status Continue strict I's and O's and daily weights  Elevated D-dimer in the setting of hypoxia CTA negative for PE. Bilateral lower extremity Doppler ultrasound negative for DVT.  History of asthma Continue home medications Continue to maintain O2 saturation greater than 92%.  Type 2 DM A1C 7.2 on 09/28/2019. Continue ISS  Resolved post repletion: Hypomagnesemia  Mag 1.5>> 2.4  Hx ischemic CVA Continue ASA, plavix and crestor  Essential HTN Continue home medications Also on IV Lasix 40 mg twice daily. IV  hydralazine 2 mg every 6 hours as needed for SBP greater than 160 Avoid  hypotension  Physical debility/Generalized weakness PT OT to assess. Out of bed to chair as tolerated with assistance and fall precautions.  Chronic anxiety/depression Continue home meds   DVT prophylaxis: SCDs, sq lovenox daily Code Status: Full code Family Communication: Updated the patient's son [name] at bedside.  Disposition Plan: From ALF; anticipate dc to ALF once respiratory status close to baseline likely in the next 48-72hours. Barrier to [employer] ongoing treatment for pneumonia and acute pulmonary edema.   Consults called:   PCCM.        Objective: Vitals:   10/02/19 1037 10/02/19 1128 10/02/19 1249 10/02/19 1317  BP: 140/63 130/65 140/66   Pulse:  83 85   Resp:  20 19   Temp:  100.3 F (37.9 C) 97.8 F (36.6 C)   TempSrc:  Oral Oral   SpO2:  99% 100% 100%  Weight:      Height:        Intake/Output Summary (Last 24 hours) at 10/02/2019 1446 Last data filed at 10/02/2019 1144 Gross per 24 hour  Intake 598.23 ml  Output 2375 ml  Net -1776.77 ml   Filed Weights   09/30/19 0500 10/01/19 0457 10/02/19 0500  Weight: 61.1 kg 62.6 kg 55.7 kg    Exam:  . General: 86 y.o. year-old female pleasant.  Well-Developed Well-Nourished appears uncomfortable due to dyspnea.   . Cardiovascular: Tachycardic with no rubs or gallops.   Marland Kitchen Respiratory: Rales noted at bases with no wheezing noted.  Poor inspiratory effort. . Abdomen: Soft nontender normal bowel sounds present.   Musculoskeletal: Trace lower extremity edema bilaterally. Psychiatry: Mood is appropriate for condition and setting.   Data Reviewed: CBC: Recent Labs  Lab 09/28/19 0520 09/29/19 0507 09/30/19 0409 10/01/19 0512 10/02/19 0549  WBC 14.2* 17.2* 13.3* 10.5 10.0  NEUTROABS 12.9* 14.8* 10.9* 8.0* 6.6  HGB 13.0 11.9* 12.0 12.5 12.9  HCT 40.2 36.8 37.2 38.5 39.5  MCV 91.4 90.9 90.7 90.0 89.0  PLT 252 238 256 270 297   Basic Metabolic Panel: Recent Labs  Lab 09/27/19 1718  09/27/19 1718 09/28/19 0520 09/28/19 0520 09/29/19 0507 09/30/19 0409 09/30/19 1250 10/01/19 0512 10/02/19 0549  NA 134*   < > 139  --  138 140  --  141 141  K 3.8   < > 3.6   < > 3.5 3.1* 3.7 3.4* 3.9  CL 98   < > 105  --  106 108  --  102 102  CO2 27   < > 23  --  26 26  --  24 30  GLUCOSE 308*   < > 183*  --  142* 145*  --  133* 143*  BUN 17   < > 16  --  22 16  --  11 13  CREATININE 0.82   < > 0.61  --  0.53 0.53  --  0.48 0.61  CALCIUM 8.9   < > 8.2*  --  8.2* 8.1*  --  7.9* 8.5*  MG 1.7  --  1.5*  --  2.4  --   --   --  1.9  PHOS  --   --   --   --  1.7*  --   --   --  2.6   < > = values in this interval not displayed.   GFR: Estimated Creatinine Clearance: 35.6 mL/min (by C-G formula based  on SCr of 0.61 mg/dL). Liver Function Tests: Recent Labs  Lab 09/27/19 1718  AST 20  ALT 16  ALKPHOS 48  BILITOT 1.1  PROT 6.8  ALBUMIN 3.6   No results for input(s): LIPASE, AMYLASE in the last 168 hours. No results for input(s): AMMONIA in the last 168 hours. Coagulation Profile: Recent Labs  Lab 09/27/19 1718  INR 1.1   Cardiac Enzymes: No results for input(s): CKTOTAL, CKMB, CKMBINDEX, TROPONINI in the last 168 hours. BNP (last 3 results) No results for input(s): PROBNP in the last 8760 hours. HbA1C: No results for input(s): HGBA1C in the last 72 hours. CBG: Recent Labs  Lab 10/01/19 1142 10/01/19 1641 10/01/19 2054 10/02/19 0745 10/02/19 1200  GLUCAP 229* 208* 186* 208* 149*   Lipid Profile: No results for input(s): CHOL, HDL, LDLCALC, TRIG, CHOLHDL, LDLDIRECT in the last 72 hours. Thyroid Function Tests: No results for input(s): TSH, T4TOTAL, FREET4, T3FREE, THYROIDAB in the last 72 hours. Anemia Panel: No results for input(s): VITAMINB12, FOLATE, FERRITIN, TIBC, IRON, RETICCTPCT in the last 72 hours. Urine analysis:    Component Value Date/Time   COLORURINE YELLOW 09/27/2019 1924   APPEARANCEUR CLEAR 09/27/2019 1924   LABSPEC 1.017 09/27/2019 1924    PHURINE 6.0 09/27/2019 1924   GLUCOSEU >=500 (A) 09/27/2019 1924   HGBUR NEGATIVE 09/27/2019 [place] NEGATIVE 09/27/2019 1924   KETONESUR 5 (A) 09/27/2019 1924   PROTEINUR 30 (A) 09/27/2019 1924   NITRITE NEGATIVE 09/27/2019 1924   LEUKOCYTESUR NEGATIVE 09/27/2019 1924   Sepsis Labs: @LABRCNTIP (procalcitonin:4,lacticidven:4)  ) Recent Results (from the past 240 hour(s))  Blood Culture (routine x 2)     Status: None   Collection Time: 09/27/19  5:18 PM   Specimen: BLOOD  Result Value Ref Range Status   Specimen Description   Final    BLOOD LEFT ANTECUBITAL Performed at [facility] [address] [place], [place] [address]    Special Requests   Final    BOTTLES DRAWN AEROBIC AND ANAEROBIC Blood Culture results may not be optimal due to an excessive volume of blood received in culture bottles Performed at [facility] [address] [place], [place]  [address]    Culture   Final    NO GROWTH 5 DAYS Performed at [facility], [place] [address], [place], [place] [address]    Report Status 10/02/2019 FINAL  Final  Respiratory Panel by RT PCR (Flu A&B, Covid) - Nasopharyngeal Swab     Status: None   Collection Time: 09/27/19  5:23 PM   Specimen: Nasopharyngeal Swab  Result Value Ref Range Status   SARS Coronavirus 2 by RT PCR NEGATIVE NEGATIVE Final    Comment: (NOTE) SARS-CoV-2 target nucleic acids are NOT DETECTED. The SARS-CoV-2 RNA is generally detectable in upper respiratoy specimens during the acute phase of infection. The lowest concentration of SARS-CoV-2 viral copies this assay can detect is 131 copies/mL. A negative result does not preclude SARS-Cov-2 infection and should not be used as the sole basis for treatment or other patient management decisions. A negative result may occur with  improper specimen collection/handling, submission of specimen other than nasopharyngeal swab, presence of viral mutation(s)  within the areas targeted by this assay, and inadequate number of viral copies (<131 copies/mL). A negative result must be combined with clinical observations, patient history, and epidemiological information. The expected result is Negative. Fact Sheet for Patients:  PinkCheek.be Fact Sheet for Healthcare Providers:  GravelBags.it This test is  not yet ap proved or cleared by the [place] and  has been authorized for detection and/or diagnosis of SARS-CoV-2 by FDA under an Emergency Use Authorization (EUA). This EUA will remain  in effect (meaning this test can be used) for the duration of the COVID-19 declaration under Section 564(b)(1) of the Act, 21 U.S.C. section 360bbb-3(b)(1), unless the authorization is terminated or revoked sooner.    Influenza A by PCR NEGATIVE NEGATIVE Final   Influenza B by PCR NEGATIVE NEGATIVE Final    Comment: (NOTE) The Xpert Xpress SARS-CoV-2/FLU/RSV assay is intended as an aid in  the diagnosis of influenza from Nasopharyngeal swab specimens and  should not be used as a sole basis for treatment. Nasal washings and  aspirates are unacceptable for Xpert Xpress SARS-CoV-2/FLU/RSV  testing. Fact Sheet for Patients: https://www.moore.com/ Fact Sheet for Healthcare Providers: https://www.young.biz/ This test is not yet approved or cleared by the [place] FDA and  has been authorized for detection and/or diagnosis of SARS-CoV-2 by  FDA under an Emergency Use Authorization (EUA). This EUA will remain  in effect (meaning this test can be used) for the duration of the  Covid-19 declaration under Section 564(b)(1) of the Act, 21  U.S.C. section 360bbb-3(b)(1), unless the authorization is  terminated or revoked. Performed at [facility], [address], [place], [place] [address]   Blood Culture (routine x 2)     Status: None    Collection Time: 09/27/19  5:33 PM   Specimen: BLOOD  Result Value Ref Range Status   Specimen Description   Final    BLOOD RIGHT ANTECUBITAL Performed at [facility], [address] [place], [place] [address]    Special Requests   Final    BOTTLES DRAWN AEROBIC AND ANAEROBIC Blood Culture adequate volume Performed at [facility], [address], [place], [place] [address]    Culture   Final    NO GROWTH 5 DAYS Performed at [facility], [address] [place], [place] [address]    Report Status 10/02/2019 FINAL  Final  Urine culture     Status: None   Collection Time: 09/27/19  7:24 PM   Specimen: In/Out Cath Urine  Result Value Ref Range Status   Specimen Description   Final    IN/OUT CATH URINE Performed at [facility], [address] [place], [place] [address]    Special Requests   Final    NONE Performed at [facility], [address], [place], [place] [address]    Culture   Final    NO GROWTH Performed at [facility], [address] [place], [place] [address]    Report Status 09/28/2019 FINAL  Final      Studies: DG Chest Port 1 View  Result Date: 10/02/2019 CLINICAL DATA:  Status post left thoracentesis EXAM: PORTABLE CHEST 1 VIEW COMPARISON:  Earlier same day FINDINGS: 10:31 a.m. lungs appear hyperexpanded. Interstitial markings are diffusely coarsened with chronic features. Bibasilar collapse/consolidative change noted with bilateral pleural effusions, left greater than right. Left pleural effusion has certainly decreased since the film earlier today and there is no evidence for pneumothorax status post thoracentesis. Bones are diffusely demineralized. Telemetry leads overlie the chest. IMPRESSION: Interval decrease in left pleural effusion status post thoracentesis. No evidence for pneumothorax. Electronically Signed   By: Kennith Center M.D.   On: 10/02/2019 12:54   DG  Chest [facility]  Result Date: 10/02/2019 CLINICAL DATA:  Pleural effusion EXAM: PORTABLE CHEST 1 VIEW COMPARISON:  10/01/2019 and prior exams FINDINGS: Cardiomediastinal silhouette is unchanged. Slightly improved lung volumes noted. Bilateral pleural effusions and bilateral airspace opacities, LEFT-greater-than-RIGHT are again noted. Little significant change from the prior study. IMPRESSION: Unchanged appearance of the chest with bilateral pleural effusions and airspace opacities. Electronically Signed   By: Harmon Pier M.D.   On: 10/02/2019 05:58   US THORACENTESIS ASP PLEURAL SPACE W/IMG GUIDE  Result Date: 10/02/2019 INDICATION: 86 year old female with left-sided pleural effusion and pneumonia EXAM: ULTRASOUND GUIDED LEFT THORACENTESIS MEDICATIONS: None. COMPLICATIONS: None PROCEDURE: An ultrasound guided thoracentesis was thoroughly discussed with the patient and questions answered. The benefits, risks, alternatives and complications were also discussed. The patient understands and wishes to proceed with the procedure. Written consent was obtained. Ultrasound was performed to localize and mark an adequate pocket of fluid in the left chest. The area was then prepped and draped in the normal sterile fashion. 1% Lidocaine was used for local anesthesia. Under ultrasound guidance a 8 Fr Safe-T-Centesis catheter was introduced. Thoracentesis was performed. The catheter was removed and a dressing applied. FINDINGS: A total of approximately 570 cc of thin amber fluid was removed. Samples were sent to the laboratory as requested by the clinical team. IMPRESSION: Status post ultrasound-guided left-sided thoracentesis. Signed, Yvone Neu. Reyne Dumas, RPVI [facility] Electronically Signed   By: Gilmer Mor D.O.   On: 10/02/2019 11:36    Scheduled Meds: . amLODipine  10 mg Oral Daily   And  . irbesartan  300 mg Oral Daily  . aspirin  81 mg Oral Daily   . chlorpheniramine-HYDROcodone  5 mL Oral Q12H  . clopidogrel  75 mg Oral Q breakfast  . dextromethorphan-guaiFENesin  1 tablet Oral BID  . docusate sodium  100 mg Oral Daily  . donepezil  10 mg Oral QHS  . enoxaparin (LOVENOX) injection  40 mg Subcutaneous Q24H  . escitalopram  5 mg Oral Daily  . feeding supplement (GLUCERNA SHAKE)  237 mL Oral TID BM  . furosemide  40 mg Intravenous Q12H  . insulin aspart  0-9 Units Subcutaneous TID WC  . ipratropium  0.5 mg Nebulization TID  . ketorolac  1 drop Both Eyes Q2000  . levalbuterol  0.63 mg Nebulization TID  . lidocaine      . metoprolol succinate  37.5 mg Oral Daily  . mometasone-formoterol  2 puff Inhalation BID  . potassium chloride  40 mEq Per Tube BID  . rosuvastatin  5 mg Oral q1800  . sodium chloride flush  3 mL Intravenous Q12H    Continuous Infusions: . cefTRIAXone (ROCEPHIN)  IV Stopped (10/01/19 1812)     LOS: 5 days     Darlin Drop, MD [facility] Pager [phone]  If 7PM-7AM, please contact night-coverage [email or web address] Password [facility] 10/02/2019, 2:46 PM

## 2019-10-03 ENCOUNTER — Inpatient Hospital Stay (HOSPITAL_COMMUNITY): Payer: Medicare Other

## 2019-10-03 DIAGNOSIS — J9601 Acute respiratory failure with hypoxia: Secondary | ICD-10-CM

## 2019-10-03 DIAGNOSIS — J90 Pleural effusion, not elsewhere classified: Secondary | ICD-10-CM | POA: Diagnosis present

## 2019-10-03 LAB — CBC WITH DIFFERENTIAL/PLATELET
Abs Immature Granulocytes: 0.57 10*3/uL — ABNORMAL HIGH (ref 0.00–0.07)
Basophils Absolute: 0.1 10*3/uL (ref 0.0–0.1)
Basophils Relative: 1 %
Eosinophils Absolute: 0.1 10*3/uL (ref 0.0–0.5)
Eosinophils Relative: 1 %
HCT: 35 % — ABNORMAL LOW (ref 36.0–46.0)
Hemoglobin: 11.2 g/dL — ABNORMAL LOW (ref 12.0–15.0)
Immature Granulocytes: 5 %
Lymphocytes Relative: 15 %
Lymphs Abs: 1.6 10*3/uL (ref 0.7–4.0)
MCH: 28.9 pg (ref 26.0–34.0)
MCHC: 32 g/dL (ref 30.0–36.0)
MCV: 90.2 fL (ref 80.0–100.0)
Monocytes Absolute: 1.4 10*3/uL — ABNORMAL HIGH (ref 0.1–1.0)
Monocytes Relative: 13 %
Neutro Abs: 7.2 10*3/uL (ref 1.7–7.7)
Neutrophils Relative %: 65 %
Platelets: 262 10*3/uL (ref 150–400)
RBC: 3.88 MIL/uL (ref 3.87–5.11)
RDW: 15 % (ref 11.5–15.5)
WBC: 10.9 10*3/uL — ABNORMAL HIGH (ref 4.0–10.5)
nRBC: 0 % (ref 0.0–0.2)

## 2019-10-03 LAB — BASIC METABOLIC PANEL
Anion gap: 7 (ref 5–15)
BUN: 18 mg/dL (ref 8–23)
CO2: 31 mmol/L (ref 22–32)
Calcium: 8.3 mg/dL — ABNORMAL LOW (ref 8.9–10.3)
Chloride: 100 mmol/L (ref 98–111)
Creatinine, Ser: 0.68 mg/dL (ref 0.44–1.00)
GFR calc Af Amer: >60 mL/min (ref >60)
GFR calc non Af Amer: >60 mL/min (ref >60)
Glucose, Bld: 112 mg/dL — ABNORMAL HIGH (ref 70–99)
Potassium: 4.8 mmol/L (ref 3.5–5.1)
Sodium: 138 mmol/L (ref 135–145)

## 2019-10-03 LAB — GLUCOSE, CAPILLARY
Glucose-Capillary: 130 mg/dL — ABNORMAL HIGH (ref 70–99)
Glucose-Capillary: 198 mg/dL — ABNORMAL HIGH (ref 70–99)
Glucose-Capillary: 177 mg/dL — ABNORMAL HIGH (ref 70–99)
Glucose-Capillary: 179 mg/dL — ABNORMAL HIGH (ref 70–99)

## 2019-10-03 LAB — MAGNESIUM
Magnesium: 1.7 mg/dL (ref 1.7–2.4)
Magnesium: 2.3 mg/dL (ref 1.7–2.4)

## 2019-10-03 LAB — PROCALCITONIN: Procalcitonin: 0.4 ng/mL

## 2019-10-03 LAB — PHOSPHORUS: Phosphorus: 4.4 mg/dL (ref 2.5–4.6)

## 2019-10-03 LAB — LACTIC ACID, PLASMA: Lactic Acid, Venous: 0.9 mmol/L (ref 0.5–1.9)

## 2019-10-03 LAB — POTASSIUM: Potassium: 4.7 mmol/L (ref 3.5–5.1)

## 2019-10-03 MED ORDER — CEFDINIR 300 MG PO CAPS
300.0000 mg | ORAL_CAPSULE | Freq: Two times a day (BID) | ORAL | Status: DC
  Administered 2019-10-03 – 2019-10-05 (×5): 300 mg via ORAL
  Filled 2019-10-03 (×6): qty 1

## 2019-10-03 MED ORDER — ESCITALOPRAM OXALATE 10 MG PO TABS: 5.0000 mg | ORAL_TABLET | Freq: Every day | ORAL | Status: DC | PRN

## 2019-10-03 MED ORDER — NALOXONE HCL 0.4 MG/ML IJ SOLN
0.4000 mg | Freq: Once | INTRAMUSCULAR | Status: AC
  Administered 2019-10-03: 18:00:00 0.4 mg via INTRAVENOUS
  Filled 2019-10-03: qty 1

## 2019-10-03 MED ORDER — PANTOPRAZOLE SODIUM 40 MG PO TBEC
40.0000 mg | DELAYED_RELEASE_TABLET | Freq: Two times a day (BID) | ORAL | Status: DC
  Administered 2019-10-03 – 2019-10-05 (×4): 40 mg via ORAL
  Filled 2019-10-03 (×4): qty 1

## 2019-10-03 MED ORDER — NALOXONE HCL 0.4 MG/ML IJ SOLN
0.4000 mg | INTRAMUSCULAR | Status: DC | PRN
  Administered 2019-10-03 – 2019-10-04 (×2): 0.4 mg via INTRAVENOUS
  Filled 2019-10-03 (×2): qty 1

## 2019-10-03 MED ORDER — MAGNESIUM SULFATE IN D5W 1-5 GM/100ML-% IV SOLN
1.0000 g | Freq: Once | INTRAVENOUS | Status: AC
  Administered 2019-10-03: 1 g via INTRAVENOUS
  Filled 2019-10-03: qty 100

## 2019-10-03 MED ORDER — METOPROLOL SUCCINATE ER 25 MG PO TB24
25.0000 mg | ORAL_TABLET | Freq: Every day | ORAL | Status: DC
  Administered 2019-10-03 – 2019-10-05 (×3): 25 mg via ORAL
  Filled 2019-10-03 (×3): qty 1

## 2019-10-03 NOTE — Progress Notes (Signed)
   NAME:  [name], MRN:  [id number], DOB:  25-Sep-1922, LOS: 6 ADMISSION DATE:  09/27/2019, CONSULTATION DATE:  10/01/2019 REFERRING MD:  Dr Margo Aye, CHIEF COMPLAINT: Pneumonia, pleural effusion  Brief History   86 year old lady presented with complaints of fever -temperature 101 rx as CAP  History of nonproductive cough Has bilateral pleural effusions No chills or rigors, She does have a baseline of dementia, type 2 diabetes, ischemic CVA, hypertension, urinary incontinence, history of asthma Resides in a skilled nursing facility   Past Medical History   Past Medical History:  Diagnosis Date  . Diabetes (HCC)   . Hearing loss   . Thalamic infarction (HCC)      Significant Hospital Events   Thoracentesis 2/7 see below  Consults:  PCCM 2/6 IR 2/7  Procedures:  Thoracentesis 2/7 see below  Significant Diagnostic Tests:  Echo 2/6 :  Grade 1 diastolic dysfunction  L Thoracentesis 2/7 x 570 cc  Alb 1.3  LDH 289  Cell count 344 with 32% Polys cyt >>>    Micro Data:  BC x 2  2/2  Neg  UC  2/2  Neg Urine Strep  2/2 Neg  Resp viral PCR  2/2  Neg for covid/influenza  PCT  2/3 = 4.11 PCT  2/5 = 2.10  PCT 2/8  =  0.40    Antimicrobials:  Azithromycin >> 2/6  Rocephin>> 2/7  Omnicef  2/8 >>>  Interim history/subjective:  Grinding upper airway pattern cough / uncomfortable lying flat, better at 45 degrees hob   Objective   Blood pressure (!) 147/69, pulse 96, temperature 98.5 F (36.9 C), temperature source Oral, resp. rate (!) 26, height 5\' 3"  (1.6 m), weight 55.7 kg, SpO2 95 %.        Intake/Output Summary (Last 24 hours) at 10/03/2019 0905 Last data filed at 10/03/2019 0700 Gross per 24 hour  Intake 120 ml  Output 1450 ml  Net -1330 ml   Filed Weights   09/30/19 0500 10/01/19 0457 10/02/19 0500  Weight: 61.1 kg 62.6 kg 55.7 kg    Examination: sats 95% on 1lpm  Frail elderly 11/30/19 female gring dry upper airway pattern cough  No jvd Neck supple Lungs  with a few scattered crackles both bases worse on L with dullness L base  RRR no s3 or or sign murmur Abd soft with nl excursion  Extr warm with no edema or clubbing noted Neuro  Sensorium minimal verbal interaction/ no apparent motor deficits       Resolved Hospital Problem list     Assessment & Plan:  Multilobar pneumonia Sepsis secondary to pneumonia - see micro/abx flow sheet above / PCT now normalized and on po abx    Hypoxemic respiratory failure History of asthma -- Adjust 02 for sats > 90%     Bilateral pleural effusion L > R  Probably transudative - s/p L  Thoracentesis 2/7 x 570 cc  Alb 1.3  LDH 289  Cell count 344 with 32% Polys cyt >>> recheck cxr today   Excessive upper airway coughing fits  - rx ppi/ hard rock candies but no cough drops/mints or methanol products   Son MD at bedside, updated    4/7, MD Pulmonary and Critical Care Medicine [facility] Cell [phone] After 5:30 PM or weekends, use Beeper [phone]

## 2019-10-03 NOTE — Progress Notes (Signed)
Subjective:  Seen by Dr. Doylene Canard last night because of EKG changes and minimally elevated troponin I.  She denies any anginal chest pain.  States breathing is improved.  Objective:  Vital Signs in the last 24 hours: Temp:  [97.8 F (36.6 C)-100.3 F (37.9 C)] 98.5 F (36.9 C) (02/08 0502) Pulse Rate:  [83-96] 96 (02/08 0502) Resp:  [19-28] 26 (02/08 0804) BP: (107-147)/(51-69) 147/69 (02/08 0502) SpO2:  [95 %-100 %] 95 % (02/08 0749)  Intake/Output from previous day: 02/07 0701 - 02/08 0700 In: 120 [P.O.:120] Out: 1450 [Urine:1450] Intake/Output from this shift: No intake/output data recorded.  Physical Exam: Neck: no adenopathy, no carotid bruit, no JVD and supple, symmetrical, trachea midline Lungs: decreased breath sounds at bases with occasional rhonchi Heart: regular rate and rhythm, S1, S2 normal and soft systolic murmur noted Abdomen: soft, non-tender; bowel sounds normal; no masses,  no organomegaly Extremities: extremities normal, atraumatic, no cyanosis or edema  Lab Results: Recent Labs    10/02/19 0549 10/03/19 0458  WBC 10.0 10.9*  HGB 12.9 11.2*  PLT 297 262   Recent Labs    10/02/19 0549 10/03/19 0458  NA 141 138  K 3.9 4.8  CL 102 100  CO2 30 31  GLUCOSE 143* 112*  BUN 13 18  CREATININE 0.61 0.68   No results for input(s): TROPONINI in the last 72 hours.  Invalid input(s): CK, MB Hepatic Function Panel No results for input(s): PROT, ALBUMIN, AST, ALT, ALKPHOS, BILITOT, BILIDIR, IBILI in the last 72 hours. No results for input(s): CHOL in the last 72 hours. No results for input(s): PROTIME in the last 72 hours.  Imaging: Imaging results have been reviewed and DG Chest Port 1 View  Result Date: 10/02/2019 CLINICAL DATA:  Status post left thoracentesis EXAM: PORTABLE CHEST 1 VIEW COMPARISON:  Earlier same day FINDINGS: 10:31 a.m. lungs appear hyperexpanded. Interstitial markings are diffusely coarsened with chronic features. Bibasilar  collapse/consolidative change noted with bilateral pleural effusions, left greater than right. Left pleural effusion has certainly decreased since the film earlier today and there is no evidence for pneumothorax status post thoracentesis. Bones are diffusely demineralized. Telemetry leads overlie the chest. IMPRESSION: Interval decrease in left pleural effusion status post thoracentesis. No evidence for pneumothorax. Electronically Signed   By: Misty Stanley M.D.   On: 10/02/2019 12:54   DG Chest Port 1 View  Result Date: 10/02/2019 CLINICAL DATA:  Pleural effusion EXAM: PORTABLE CHEST 1 VIEW COMPARISON:  10/01/2019 and prior exams FINDINGS: Cardiomediastinal silhouette is unchanged. Slightly improved lung volumes noted. Bilateral pleural effusions and bilateral airspace opacities, LEFT-greater-than-RIGHT are again noted. Little significant change from the prior study. IMPRESSION: Unchanged appearance of the chest with bilateral pleural effusions and airspace opacities. Electronically Signed   By: Margarette Canada M.D.   On: 10/02/2019 05:58   ECHOCARDIOGRAM COMPLETE  Result Date: 10/01/2019   ECHOCARDIOGRAM REPORT   Patient Name:   [name] Date of Exam: 10/01/2019 Medical Rec #:  [id number]        Height:       63.0 in Accession #:    [id number]       Weight:       138.0 lb Date of Birth:  22-Aug-1923        BSA:          1.65 m Patient Age:    86 years         BP:  153/70 mmHg Patient Gender: F                HR:           106 bpm. Exam Location:  Inpatient Procedure: 2D Echo Indications:    Elevated Troponin  History:        Patient has prior history of Echocardiogram examinations, most                 recent 09/27/2010. CAD; Risk Factors:Hypertension and                 Dyslipidemia. Severe sepsis.  Sonographer:    Leeroy Bock Turrentine Referring Phys: [id number] Darlin Drop  Sonographer Comments: Image acquisition challenging due to uncooperative patient and Image acquisition challenging due to  respiratory motion. IMPRESSIONS  1. Left ventricular ejection fraction, by visual estimation, is 70 to 75%. The left ventricle has hyperdynamic function. There is no left ventricular hypertrophy.  2. Left ventricular diastolic parameters are consistent with Grade I diastolic dysfunction (impaired relaxation).  3. The left ventricle has no regional wall motion abnormalities.  4. Global right ventricle has normal systolic function.The right ventricular size is normal. No increase in right ventricular wall thickness.  5. Left atrial size was normal.  6. Right atrial size was normal.  7. Moderate mitral annular calcification.  8. The mitral valve is grossly normal. No evidence of mitral valve regurgitation.  9. The tricuspid valve is grossly normal. 10. The tricuspid valve is grossly normal. Tricuspid valve regurgitation is trivial. 11. The aortic valve was not well visualized. Aortic valve regurgitation is mild. 12. The pulmonic valve was not well visualized. Pulmonic valve regurgitation is not visualized. 13. The inferior vena cava is normal in size with greater than 50% respiratory variability, suggesting right atrial pressure of 3 mmHg. 14. The interatrial septum was not well visualized. FINDINGS  Left Ventricle: Left ventricular ejection fraction, by visual estimation, is 70 to 75%. The left ventricle has hyperdynamic function. The left ventricle has no regional wall motion abnormalities. The left ventricular internal cavity size was the left ventricle is normal in size. There is no left ventricular hypertrophy. Left ventricular diastolic parameters are consistent with Grade I diastolic dysfunction (impaired relaxation). Right Ventricle: The right ventricular size is normal. No increase in right ventricular wall thickness. Global RV systolic function is has normal systolic function. Left Atrium: Left atrial size was normal in size. Right Atrium: Right atrial size was normal in size Pericardium: There is no evidence  of pericardial effusion. Mitral Valve: The mitral valve is grossly normal. Moderate mitral annular calcification. No evidence of mitral valve regurgitation. Tricuspid Valve: The tricuspid valve is grossly normal. Tricuspid valve regurgitation is trivial. Aortic Valve: The aortic valve was not well visualized. Aortic valve regurgitation is mild. Aortic valve mean gradient measures 6.0 mmHg. Aortic valve peak gradient measures 10.1 mmHg. Pulmonic Valve: The pulmonic valve was not well visualized. Pulmonic valve regurgitation is not visualized. Pulmonic regurgitation is not visualized. Aorta: The aortic root is normal in size and structure. Venous: The inferior vena cava is normal in size with greater than 50% respiratory variability, suggesting right atrial pressure of 3 mmHg. IAS/Shunts: The interatrial septum was not well visualized.  LEFT VENTRICLE PLAX 2D LVIDd:         2.80 cm LVIDs:         1.60 cm LV PW:         0.80 cm LV IVS:  0.90 cm LVOT diam:     1.70 cm LV SV:         22 ml LV SV Index:   13.34 LVOT Area:     2.27 cm  RIGHT VENTRICLE RV S prime:     14.30 cm/s TAPSE (M-mode): 1.5 cm LEFT ATRIUM           Index       RIGHT ATRIUM           Index LA diam:      3.40 cm 2.06 cm/m  RA Area:     14.20 cm LA Vol (A4C): 48.7 ml 29.49 ml/m RA Volume:   36.10 ml  21.86 ml/m  AORTIC VALVE AV Vmax:      159.00 cm/s AV Vmean:     120.000 cm/s AV VTI:       0.282 m AV Peak Grad: 10.1 mmHg AV Mean Grad: 6.0 mmHg  AORTA Ao Root diam: 2.50 cm  SHUNTS Systemic Diam: 1.70 cm  Prentice Docker MD Electronically signed by Prentice Docker MD Signature Date/Time: 10/01/2019/1:45:15 PM    Final    US THORACENTESIS ASP PLEURAL SPACE W/IMG GUIDE  Result Date: 10/02/2019 INDICATION: 86 year old female with left-sided pleural effusion and pneumonia EXAM: ULTRASOUND GUIDED LEFT THORACENTESIS MEDICATIONS: None. COMPLICATIONS: None PROCEDURE: An ultrasound guided thoracentesis was thoroughly discussed with the patient  and questions answered. The benefits, risks, alternatives and complications were also discussed. The patient understands and wishes to proceed with the procedure. Written consent was obtained. Ultrasound was performed to localize and mark an adequate pocket of fluid in the left chest. The area was then prepped and draped in the normal sterile fashion. 1% Lidocaine was used for local anesthesia. Under ultrasound guidance a 8 Fr Safe-T-Centesis catheter was introduced. Thoracentesis was performed. The catheter was removed and a dressing applied. FINDINGS: A total of approximately 570 cc of thin amber fluid was removed. Samples were sent to the laboratory as requested by the clinical team. IMPRESSION: Status post ultrasound-guided left-sided thoracentesis. Signed, Yvone Neu. Reyne Dumas, RPVI [facility] Electronically Signed   By: Gilmer Mor D.O.   On: 10/02/2019 11:36    Cardiac Studies:  Assessment/Plan:  Status post atypical chest pain. Minimally elevated high sensitivity troponin due to demand ischemia doubt significant MI Acute diastolic left heart failure, compensated Left lower lobe pneumonia Bilateral pleural effusions S/P left sided thoracentesis Type 2 DM with hyperglycemia S/P stroke Senile dementia Bilateral hearing loss Asthma Generalized weakness  Plan Continue present management. Out of bed to chair. Encouraged ambulation  LOS: 6 days    Rinaldo Cloud 10/03/2019, 9:11 AM

## 2019-10-03 NOTE — Progress Notes (Signed)
Patient is more alert after second dose of 0.4 mg of Narcan. Sitting up and eating dinner.

## 2019-10-03 NOTE — Progress Notes (Addendum)
Mrs. Tep became drowsy this afternoon after taking Tussionex at 9 this morning for cough associated pleuritic pain.  The hydrocodone in the solution was reversed with Narcan 0.4 mg x 2.  Tussionex has been added to her list of allergies.  Soft BP this afternoon but maintaining MAP >65.  Holding off IV lasix.  Will continue to closely monitor.

## 2019-10-03 NOTE — Progress Notes (Signed)
OT Cancellation Note  Patient Details Name: SKILYNN DURNEY MRN: [id number] DOB: 03-11-1923   Cancelled Treatment:    Reason Eval/Treat Not Completed: Fatigue/lethargy limiting ability to participate  Lise Auer, OT Acute Rehabilitation Services [phone] Office- [phone]     Draden Cottingham, [place] 10/03/2019, 5:18 PM

## 2019-10-03 NOTE — Progress Notes (Signed)
PROGRESS NOTE  [name]:[id number] DOB: 05/29/1923 DOA: 09/27/2019 PCP: Molly Solian, MD  HPI/Recap of past 24 hours: [name] is a 86 y.o. female with medical history significant for dementia, type 2 diabetes mellitus, ischemic CVA, hypertension, urinary incontinence, asthma, who is admitted to [facility] on 09/27/2019 with severe sepsis due to left lower lobe pneumonia after presenting from ALF for evaluation of fever.   Hypoxic on presentation.  Responded well to empiric IV antibiotics.  CTA negative for PE.  Hospital course complicated by pulmonary edema and bilateral pleural effusion in the setting of IV fluid hydration.  Improved with diuresing.  2D echo showed LVEF 70-75% with grade I DD.  Post left thoracentesis by IR [name] on 10/02/19 with 570 cc of amber fluid removed.  PCCM following.  Abnormal 12 lead EKG with mildly elevated troponin.  Cardiology consulted on 10/02/19.  Appreciate specialists assistance.  10/03/19: Seen and examined this morning with her son [name] at her bedside.  Resting comfortably.  In no acute distress.  No new reported symptoms.   Assessment/Plan: Principal Problem:   LLL pneumonia Active Problems:   Essential hypertension   Cough   Severe sepsis (HCC)   Fever   Generalized weakness   Dehydration   Bilateral pleural effusion   Acute respiratory failure with hypoxemia (HCC)   Resolving sepsis 2/2 LLL CAP Presented with leukocytosis, tachycardia, chest x-ray with findings suggestive of left lower lobe pneumonia.   Reviewed CXR personally, infiltrates noted at LLL Completed 6 days of IV abx empirically IV azithromycin and Rocephin. Switched to po abx cefdinir 300 mg BID x 4 days. Procalcitonin and lactic acid normalized on 10/03/2019. Afebrile with no leukocytosis Nontoxic-appearing Obtain Home O2 evaluation for dc planning Encourage use of incentive spirometer and flutter valve. Blood cultures and urine  culture negative final.   Mobilize as tolerated  Acute hypoxic respiratory failure secondary to left lower lobe community-acquired pneumonia complicated by pulmonary edema and bilateral pleural effusions left greater than right post ongoing diuresing and left thoracentesis 2/7 by IR. Not on O2 supplementation at baseline Ultrasound-guided left thoracentesis done by interventional radiology [name] with 570 cc of amber fluid removed. O2 saturation improved 100% on 2.5 L. Continue bronchodilators Continue incentive spirometer and flutter valve. PCCM following.  Bilateral pleural effusion left greater than right likely in the setting of pneumonia superimposed by volume overload, iatrogenic from IV fluid hydration Post ultrasound-guided left thoracentesis Management as stated above Responded well to diuresis with good urine output IV Lasix, dose cut down to 40 mg twice daily. Defer to PCCM to adjust diuretics.  Acute pulmonary edema, likely cardiogenic in the setting of volume overload Continue to closely monitor volume status Currently on IV Lasix 40 mg twice daily and diuresing well No evidence of renal failure  Hyperkalemia Potassium 4.8 Currently on diuretics IV Lasix 40 mg twice daily Repeat potassium level at 1700 Continue to closely monitor and treat if indicated  Pleuritic pain in the setting of intractable cough Received Tussionex twice daily x 1-2 day, discontinued upon family's request. Continue to closely monitor  Elevated troponin likely demand ischemia in the setting of hypoxia Denies any anginal symptoms Troponin peaked at 125 and trended down to 42. No sign of acute ischemia on twelve-lead EKG.    Chronic diastolic CHF 2D echo done on 10/01/2019 showed LVEF 70 to 75% with grade 1 diastolic dysfunction Continue to monitor volume status Continue strict I's and O's  and daily weights  Moderate protein calorie malnutrition Albumin 3.6 BMI 21 Loss of muscle  mass Dietitian consulted for nutritional assistance Continue Glucerna/oral supplement  Elevated D-dimer in the setting of hypoxia CTA negative for PE. Bilateral lower extremity Doppler ultrasound negative for DVT.  History of asthma Continue home medications Continue to maintain O2 saturation greater than 92%.  Type 2 DM A1C 7.2 on 09/28/2019. Continue ISS  Resolved post repletion: Hypomagnesemia  Mag 1.5>> 2.4>>1.7 Received 1 g of IV magnesium x1 Repeat magnesium level at 1700  Hx ischemic CVA Continue ASA, plavix and crestor  Essential HTN Continue home medications Also on IV Lasix 40 mg twice daily. IV hydralazine 2 mg every 6 hours as needed for SBP greater than 160 Avoid hypotension  Physical debility/Generalized weakness PT OT to assess. Out of bed to chair as tolerated with assistance and fall precautions.  Chronic anxiety/depression Continue home meds   DVT prophylaxis: SCDs, sq lovenox daily Code Status: Full code Family Communication: Updated the patient's son [name] at bedside on 10/03/2019.  Disposition Plan: From ALF; anticipate dc to ALF once respiratory status close to baseline likely tomorrow 10/04/2019. Barrier to [employer] ongoing treatment for pneumonia and acute pulmonary edema.   Consults called:   PCCM.        Objective: Vitals:   10/03/19 0749 10/03/19 0804 10/03/19 0929 10/03/19 1259  BP:   (!) 137/50 (!) 120/54  Pulse:   (!) 109 78  Resp:  (!) 26  18  Temp:    (!) 100.7 F (38.2 C)  TempSrc:    Oral  SpO2: 95%  92% 100%  Weight:      Height:        Intake/Output Summary (Last 24 hours) at 10/03/2019 1303 Last data filed at 10/03/2019 0700 Gross per 24 hour  Intake 120 ml  Output 1150 ml  Net -1030 ml   Filed Weights   09/30/19 0500 10/01/19 0457 10/02/19 0500  Weight: 61.1 kg 62.6 kg 55.7 kg    Exam:  . General: 86 y.o. year-old female Pleasant, resting comfortably no acute distress.   . Cardiovascular: Regular rate  and rhythm no rubs or gallops. Marland Kitchen Respiratory: Diffuse rales anteriorly no wheezing noted.  Poor inspiratory effort.   . Abdomen: Soft Nontender Normal Bowel Sounds Present.   . Musculoskeletal: Trace lower extremity edema bilaterally.   Marland Kitchen Psychiatry: Mood is appropriate for condition and setting.  Data Reviewed: CBC: Recent Labs  Lab 09/29/19 0507 09/30/19 0409 10/01/19 0512 10/02/19 0549 10/03/19 0458  WBC 17.2* 13.3* 10.5 10.0 10.9*  NEUTROABS 14.8* 10.9* 8.0* 6.6 7.2  HGB 11.9* 12.0 12.5 12.9 11.2*  HCT 36.8 37.2 38.5 39.5 35.0*  MCV 90.9 90.7 90.0 89.0 90.2  PLT 238 256 270 297 262   Basic Metabolic Panel: Recent Labs  Lab 09/27/19 1718 09/27/19 1718 09/28/19 0520 09/28/19 0520 09/29/19 0507 09/29/19 0507 09/30/19 0409 09/30/19 1250 10/01/19 0512 10/02/19 0549 10/03/19 0458  NA 134*   < > 139   < > 138  --  140  --  141 141 138  K 3.8   < > 3.6   < > 3.5   < > 3.1* 3.7 3.4* 3.9 4.8  CL 98   < > 105   < > 106  --  108  --  102 102 100  CO2 27   < > 23   < > 26  --  26  --  24 30 31  GLUCOSE 308*   < > 183*   < > 142*  --  145*  --  133* 143* 112*  BUN 17   < > 16   < > 22  --  16  --  11 13 18   CREATININE 0.82   < > 0.61   < > 0.53  --  0.53  --  0.48 0.61 0.68  CALCIUM 8.9   < > 8.2*   < > 8.2*  --  8.1*  --  7.9* 8.5* 8.3*  MG 1.7  --  1.5*  --  2.4  --   --   --   --  1.9 1.7  PHOS  --   --   --   --  1.7*  --   --   --   --  2.6  --    < > = values in this interval not displayed.   GFR: Estimated Creatinine Clearance: 35.6 mL/min (by C-G formula based on SCr of 0.68 mg/dL). Liver Function Tests: Recent Labs  Lab 09/27/19 1718  AST 20  ALT 16  ALKPHOS 48  BILITOT 1.1  PROT 6.8  ALBUMIN 3.6   No results for input(s): LIPASE, AMYLASE in the last 168 hours. No results for input(s): AMMONIA in the last 168 hours. Coagulation Profile: Recent Labs  Lab 09/27/19 1718  INR 1.1   Cardiac Enzymes: No results for input(s): CKTOTAL, CKMB, CKMBINDEX,  TROPONINI in the last 168 hours. BNP (last 3 results) No results for input(s): PROBNP in the last 8760 hours. HbA1C: No results for input(s): HGBA1C in the last 72 hours. CBG: Recent Labs  Lab 10/02/19 1200 10/02/19 1637 10/02/19 2114 10/03/19 0801 10/03/19 1151  GLUCAP 149* 215* 198* 130* 198*   Lipid Profile: No results for input(s): CHOL, HDL, LDLCALC, TRIG, CHOLHDL, LDLDIRECT in the last 72 hours. Thyroid Function Tests: No results for input(s): TSH, T4TOTAL, FREET4, T3FREE, THYROIDAB in the last 72 hours. Anemia Panel: No results for input(s): VITAMINB12, FOLATE, FERRITIN, TIBC, IRON, RETICCTPCT in the last 72 hours. Urine analysis:    Component Value Date/Time   COLORURINE YELLOW 09/27/2019 1924   APPEARANCEUR CLEAR 09/27/2019 1924   LABSPEC 1.017 09/27/2019 1924   PHURINE 6.0 09/27/2019 1924   GLUCOSEU >=500 (A) 09/27/2019 1924   HGBUR NEGATIVE 09/27/2019 1924   BILIRUBINUR NEGATIVE 09/27/2019 1924   KETONESUR 5 (A) 09/27/2019 1924   PROTEINUR 30 (A) 09/27/2019 1924   NITRITE NEGATIVE 09/27/2019 1924   LEUKOCYTESUR NEGATIVE 09/27/2019 1924   Sepsis Labs: @LABRCNTIP (procalcitonin:4,lacticidven:4)  ) Recent Results (from the past 240 hour(s))  Blood Culture (routine x 2)     Status: None   Collection Time: 09/27/19  5:18 PM   Specimen: BLOOD  Result Value Ref Range Status   Specimen Description   Final    BLOOD LEFT ANTECUBITAL Performed at [facility], [address] [place], [place]    Special Requests   Final    BOTTLES DRAWN AEROBIC AND ANAEROBIC Blood Culture results may not be optimal due to an excessive volume of blood received in culture bottles Performed at [facility], [address] [place], [place]    Culture   Final    NO GROWTH 5 DAYS Performed at [facility], [address] [place], [address] [place]    Report Status 10/02/2019 FINAL  Final  Respiratory Panel by RT PCR  (Flu A&B, Covid) - Nasopharyngeal Swab  Status: None   Collection Time: 09/27/19  5:23 PM   Specimen: Nasopharyngeal Swab  Result Value Ref Range Status   SARS Coronavirus 2 by RT PCR NEGATIVE NEGATIVE Final    Comment: (NOTE) SARS-CoV-2 target nucleic acids are NOT DETECTED. The SARS-CoV-2 RNA is generally detectable in upper respiratoy specimens during the acute phase of infection. The lowest concentration of SARS-CoV-2 viral copies this assay can detect is 131 copies/mL. A negative result does not preclude SARS-Cov-2 infection and should not be used as the sole basis for treatment or other patient management decisions. A negative result may occur with  improper specimen collection/handling, submission of specimen other than nasopharyngeal swab, presence of viral mutation(s) within the areas targeted by this assay, and inadequate number of viral copies (<131 copies/mL). A negative result must be combined with clinical observations, patient history, and epidemiological information. The expected result is Negative. Fact Sheet for Patients:  https://www.moore.com/ Fact Sheet for Healthcare Providers:  https://www.young.biz/ This test is not yet ap proved or cleared by the [place] FDA and  has been authorized for detection and/or diagnosis of SARS-CoV-2 by FDA under an Emergency Use Authorization (EUA). This EUA will remain  in effect (meaning this test can be used) for the duration of the COVID-19 declaration under Section 564(b)(1) of the Act, 21 U.S.C. section 360bbb-3(b)(1), unless the authorization is terminated or revoked sooner.    Influenza A by PCR NEGATIVE NEGATIVE Final   Influenza B by PCR NEGATIVE NEGATIVE Final    Comment: (NOTE) The Xpert Xpress SARS-CoV-2/FLU/RSV assay is intended as an aid in  the diagnosis of influenza from Nasopharyngeal swab specimens and  should not be used as a sole basis for treatment. Nasal  washings and  aspirates are unacceptable for Xpert Xpress SARS-CoV-2/FLU/RSV  testing. Fact Sheet for Patients: https://www.moore.com/ Fact Sheet for Healthcare Providers: https://www.young.biz/ This test is not yet approved or cleared by the [place] FDA and  has been authorized for detection and/or diagnosis of SARS-CoV-2 by  FDA under an Emergency Use Authorization (EUA). This EUA will remain  in effect (meaning this test can be used) for the duration of the  Covid-19 declaration under Section 564(b)(1) of the Act, 21  U.S.C. section 360bbb-3(b)(1), unless the authorization is  terminated or revoked. Performed at [facility], [address], [place], [place] [address]   Blood Culture (routine x 2)     Status: None   Collection Time: 09/27/19  5:33 PM   Specimen: BLOOD  Result Value Ref Range Status   Specimen Description   Final    BLOOD RIGHT ANTECUBITAL Performed at [facility], [address] [place], [place] [address]    Special Requests   Final    BOTTLES DRAWN AEROBIC AND ANAEROBIC Blood Culture adequate volume Performed at [facility], [address], [place], [place] [address]    Culture   Final    NO GROWTH 5 DAYS Performed at [facility], [address], [place], [place] [address]    Report Status 10/02/2019 FINAL  Final  Urine culture     Status: None   Collection Time: 09/27/19  7:24 PM   Specimen: In/Out Cath Urine  Result Value Ref Range Status   Specimen Description   Final    IN/OUT CATH URINE Performed at [facility], [address], [place], [place] [address]    Special Requests   Final    NONE Performed at [facility], [address], [place], [place] [address]    Culture   Final    NO GROWTH Performed at [facility], [address] [place], [place] [address]    Report Status 09/28/2019  FINAL  Final      Studies: DG Chest 2 View  Result Date: 10/03/2019 CLINICAL DATA:  Status post thoracentesis EXAM: CHEST - 2 VIEW COMPARISON:  10/02/2019 FINDINGS: Cardiac shadow is mildly enlarged but stable. Aortic calcifications are seen. Small pleural effusions are noted similar to that seen on the prior exam. Mild vascular congestion is noted. IMPRESSION: Small effusions similar to that noted on the prior exam. Mild vascular congestion. Electronically Signed   By: Alcide Clever M.D.   On: 10/03/2019 12:06    Scheduled Meds: . amLODipine  10 mg Oral Daily   And  . irbesartan  300 mg Oral Daily  . aspirin  81 mg Oral Daily  . cefdinir  300 mg Oral Q12H  . clopidogrel  75 mg Oral Q breakfast  . dextromethorphan-guaiFENesin  1 tablet Oral BID  . docusate sodium  100 mg Oral Daily  . donepezil  10 mg Oral QHS  . enoxaparin (LOVENOX) injection  40 mg Subcutaneous Q24H  . escitalopram  5 mg Oral Daily  . feeding supplement (GLUCERNA SHAKE)  237 mL Oral TID BM  . furosemide  40 mg Intravenous Q12H  . insulin aspart  0-9 Units Subcutaneous TID WC  . ipratropium  0.5 mg Nebulization TID  . ketorolac  1 drop Both Eyes Q2000  . levalbuterol  0.63 mg Nebulization TID  . metoprolol succinate  25 mg Oral Daily  . mometasone-formoterol  2 puff Inhalation BID  . pantoprazole  40 mg Oral BID AC  . rosuvastatin  5 mg Oral q1800  . sodium chloride flush  3 mL Intravenous Q12H    Continuous Infusions:    LOS: 6 days     Darlin Drop, MD [facility] Pager [phone]  If 7PM-7AM, please contact night-coverage [email or web address] Password [email or web address] 10/03/2019, 1:03 PM

## 2019-10-03 NOTE — Progress Notes (Signed)
Received report from Dietitian at [facility] [employer].

## 2019-10-03 NOTE — Progress Notes (Signed)
PT Cancellation Note  Patient Details Name: [name] MRN: [id number] DOB: 03-19-1923   Cancelled Treatment:    Reason Eval/Treat Not Completed: Fatigue/lethargy limiting ability to participate--attempted PT tx session-pt unable to keep eyes open at this time. Will check back later today.    Faye Ramsay, PT Acute Rehabilitation

## 2019-10-03 NOTE — Progress Notes (Signed)
Physical Therapy Treatment Patient Details Name: [name] MRN: [id number] DOB: May 21, 1923 Today's Date: 10/03/2019    History of Present Illness 86 yo female admitted with Pna, weakness, sepsis. Hx of dementia, CVA, Dm, asthma, incontinence    PT Comments    Progressing slowly with mobility. Pt more alert this afternoon (after being assisted to chair and RN administering narcan). She was able to walk ~25 feet. She required Min assist for mobility, total assist for toilet hygiene. O2 sat: 93% on RA at rest, 88% on RA during ambulation. Pt was very fatigued after walking that short distance. Replaced [place] O2 end of session-sats 97%. Son had left room by end of PT visit. May need to consider ST rehab placement unless ALF can provide increased level of supervision/assist.     Follow Up Recommendations  SNF;Supervision/Assistance - 24 hour (HHPT at ALF if facility is able to provide increased supervision/assist)     Equipment Recommendations  None recommended by PT    Recommendations for Other Services      Precautions / Restrictions Precautions Precautions: Fall Precaution Comments: monitor O2 Restrictions Weight Bearing Restrictions: No    Mobility  Bed Mobility               General bed mobility comments: oob in recliner  Transfers Overall transfer level: Needs assistance Equipment used: Rolling walker (2 wheeled) Transfers: Sit to/from [employer] Sit to Stand: Min assist Stand pivot transfers: Min assist       General transfer comment: Assist to rise, stabilize, control descent, maneuver RW.  Ambulation/Gait Ambulation/Gait assistance: Min assist Gait Distance (Feet): 25 Feet Assistive device: Rolling walker (2 wheeled) Gait Pattern/deviations: Decreased stride length;Step-to pattern;Trunk flexed     General Gait Details: Assist to stabilize pt and maneuver with RW. Repeated cueing for proper step length and proper walker positioning. O2  88% on RA, dyspnea 2/4. Pt fatigued after short walk. Unsteady.   Stairs             Wheelchair Mobility    Modified Rankin (Stroke Patients Only)       Balance Overall balance assessment: Needs assistance         Standing balance support: Bilateral upper extremity supported Standing balance-Leahy Scale: Poor                              Cognition Arousal/Alertness: Awake/alert Behavior During Therapy: WFL for tasks assessed/performed Overall Cognitive Status: History of cognitive impairments - at baseline                                 General Comments: following all commands and speaks enough [place].      Exercises      General Comments        Pertinent Vitals/Pain Pain Assessment: No/denies pain    Home Living                      Prior Function            PT Goals (current goals can now be found in the care plan section) Progress towards PT goals: Progressing toward goals    Frequency    Min 3X/week      PT Plan Current plan remains appropriate    Co-evaluation              AM-PAC  PT "6 Clicks" Mobility   Outcome Measure  Help needed turning from your back to your side while in a flat bed without using bedrails?: A Little Help needed moving from lying on your back to sitting on the side of a flat bed without using bedrails?: A Little Help needed moving to and from a bed to a chair (including a wheelchair)?: A Little Help needed standing up from a chair using your arms (e.g., wheelchair or bedside chair)?: A Little Help needed to walk in hospital room?: A Little Help needed climbing 3-5 steps with a railing? : A Lot 6 Click Score: 17    End of Session Equipment Utilized During Treatment: Gait belt;Oxygen Activity Tolerance: Patient limited by fatigue Patient left: in chair;with call bell/phone within reach;with chair alarm set   PT Visit Diagnosis: Muscle weakness (generalized)  (M62.81);Difficulty in walking, not elsewhere classified (R26.2)     Time: 1601-0932 PT Time Calculation (min) (ACUTE ONLY): 39 min  Charges:  $Gait Training: 8-22 mins $Therapeutic Activity: 23-37 mins                        Doreatha Massed, PT Acute Rehabilitation

## 2019-10-04 ENCOUNTER — Inpatient Hospital Stay (HOSPITAL_COMMUNITY): Payer: Medicare Other

## 2019-10-04 LAB — CBC
HCT: 34.4 % — ABNORMAL LOW (ref 36.0–46.0)
Hemoglobin: 10.8 g/dL — ABNORMAL LOW (ref 12.0–15.0)
MCH: 28.6 pg (ref 26.0–34.0)
MCHC: 31.4 g/dL (ref 30.0–36.0)
MCV: 91 fL (ref 80.0–100.0)
Platelets: 264 10*3/uL (ref 150–400)
RBC: 3.78 MIL/uL — ABNORMAL LOW (ref 3.87–5.11)
RDW: 14.6 % (ref 11.5–15.5)
WBC: 8 10*3/uL (ref 4.0–10.5)
nRBC: 0 % (ref 0.0–0.2)

## 2019-10-04 LAB — BASIC METABOLIC PANEL
Anion gap: 7 (ref 5–15)
BUN: 25 mg/dL — ABNORMAL HIGH (ref 8–23)
CO2: 31 mmol/L (ref 22–32)
Calcium: 8.3 mg/dL — ABNORMAL LOW (ref 8.9–10.3)
Chloride: 98 mmol/L (ref 98–111)
Creatinine, Ser: 0.7 mg/dL (ref 0.44–1.00)
GFR calc Af Amer: >60 mL/min (ref >60)
GFR calc non Af Amer: >60 mL/min (ref >60)
Glucose, Bld: 128 mg/dL — ABNORMAL HIGH (ref 70–99)
Potassium: 4.4 mmol/L (ref 3.5–5.1)
Sodium: 136 mmol/L (ref 135–145)

## 2019-10-04 LAB — SARS CORONAVIRUS 2 (TAT 6-24 HRS): SARS Coronavirus 2: NEGATIVE

## 2019-10-04 LAB — GLUCOSE, CAPILLARY
Glucose-Capillary: 121 mg/dL — ABNORMAL HIGH (ref 70–99)
Glucose-Capillary: 219 mg/dL — ABNORMAL HIGH (ref 70–99)
Glucose-Capillary: 194 mg/dL — ABNORMAL HIGH (ref 70–99)
Glucose-Capillary: 123 mg/dL — ABNORMAL HIGH (ref 70–99)

## 2019-10-04 LAB — CYTOLOGY - NON PAP

## 2019-10-04 MED ORDER — ESCITALOPRAM OXALATE 10 MG PO TABS
5.0000 mg | ORAL_TABLET | Freq: Every day | ORAL | Status: DC
  Administered 2019-10-04 – 2019-10-05 (×2): 5 mg via ORAL
  Filled 2019-10-04 (×2): qty 1

## 2019-10-04 MED ORDER — DICLOFENAC SODIUM 1 % EX GEL
2.0000 g | Freq: Four times a day (QID) | CUTANEOUS | Status: DC
  Administered 2019-10-04: 2 g via TOPICAL
  Filled 2019-10-04: qty 100

## 2019-10-04 MED ORDER — PRO-STAT SUGAR FREE PO LIQD
30.0000 mL | Freq: Three times a day (TID) | ORAL | Status: DC
  Administered 2019-10-04 – 2019-10-05 (×2): 30 mL via ORAL
  Filled 2019-10-04 (×2): qty 30

## 2019-10-04 MED ORDER — IRBESARTAN 300 MG PO TABS
300.0000 mg | ORAL_TABLET | Freq: Every day | ORAL | Status: DC
  Administered 2019-10-04 – 2019-10-05 (×2): 300 mg via ORAL
  Filled 2019-10-04: qty 1

## 2019-10-04 MED ORDER — ESCITALOPRAM OXALATE 10 MG PO TABS: 5.0000 mg | ORAL_TABLET | Freq: Every day | ORAL | Status: DC

## 2019-10-04 MED ORDER — AMLODIPINE BESYLATE 5 MG PO TABS
5.0000 mg | ORAL_TABLET | Freq: Every day | ORAL | Status: DC
  Administered 2019-10-04 – 2019-10-05 (×2): 5 mg via ORAL
  Filled 2019-10-04: qty 1

## 2019-10-04 MED ORDER — RESOURCE INSTANT PROTEIN PO PWD PACKET
1.0000 | Freq: Three times a day (TID) | ORAL | Status: DC
  Administered 2019-10-04 – 2019-10-05 (×3): 6 g via ORAL
  Filled 2019-10-04 (×4): qty 6

## 2019-10-04 MED ORDER — ADULT MULTIVITAMIN W/MINERALS CH
1.0000 | ORAL_TABLET | Freq: Every day | ORAL | Status: DC
  Administered 2019-10-04 – 2019-10-05 (×2): 1 via ORAL
  Filled 2019-10-04 (×2): qty 1

## 2019-10-04 MED ORDER — FUROSEMIDE 20 MG PO TABS: 20.0000 mg | ORAL_TABLET | Freq: Every day | ORAL | Status: DC

## 2019-10-04 NOTE — Plan of Care (Signed)

## 2019-10-04 NOTE — TOC Progression Note (Signed)
Transition of Care [facility]) - Progression Note    Patient Details  Name: [name] MRN: [id number] Date of Birth: 25-Apr-1923  Transition of Care [facility]) CM/SW Contact  Camala Talwar, Olegario Messier, RN Phone Number: 10/04/2019, 2:57 PM  Clinical Narrative: updated fl2,PT/OT notes faxed to [phone] [facility] will [facility] following.      Expected Discharge Plan: Home w Home Health Services Barriers to Discharge: Continued Medical Work up  Expected Discharge Plan and Services Expected Discharge Plan: Home w Home Health Services   Discharge Planning Services: CM Consult   Living arrangements for the past 2 months: Assisted Living Facility                           HH Arranged: Nurse's Aide [facility] Agency: [facility] Date HH Agency Contacted: 10/04/19 Time HH Agency Contacted: 1317 Representative spoke with at [facility] Agency: Kathlene November   Social Determinants of Health (SDOH) Interventions    Readmission Risk Interventions No flowsheet data found.

## 2019-10-04 NOTE — Progress Notes (Addendum)
Initial Nutrition Assessment  DOCUMENTATION CODES:   Non-severe (moderate) malnutrition in context of chronic illness  INTERVENTION:  - will d/c Glucerna Shake, each supplement provides 220 kcal and 10 grams protein. - will order Hormel Shake BID, each supplement provides 500 kcal and 22 grams protein. - will order 1 scoop Beneprotein per meal, each scoop provides 25 kcal and 6 grams protein. - will order 30 ml prostat TID, each packet provides 100 kcal and 15 grams protein.   - will liberalize diet from Carb Modified to Regular (approved by MD). - will order daily multivitamin with minerals.    NUTRITION DIAGNOSIS:   Moderate Malnutrition related to chronic illness as evidenced by moderate fat depletion, moderate muscle depletion.  GOAL:   Patient will meet greater than or equal to 90% of their needs  MONITOR:   PO intake, Supplement acceptance, Labs, Weight trends  REASON FOR ASSESSMENT:   Consult Assessment of nutrition requirement/status  ASSESSMENT:   86 y.o. female with medical history of dementia, type 2 DM, ischemic CVA, HTN, urinary incontinence, and asthma. She was admitted on 2/2 with severe sepsis d/t LLL PNA. She had presented from ALF due to fever. Hospital course complicated by pulmonary edema and bilateral PE. Cardiology consulted on 2/7.  Per flow sheet documentation, patient consumed 10% of breakfast and 50% of lunch on 2/5; 75% of lunch on 2/7. No other intakes documented since admission on 2/2. Patient sitting in the chair. She is fairly HOH. Son, who lives in [place], is at bedside. He has been bringing some food in from a local restaurant: mainly lentils and a yogurt dish. Patient has mainly preferred softer items such as yogurt, omelets/scrambled eggs, soft fruit (such as banana), and liquids.  She has has been sipping on [employer], but son does not think she finishes even a full one/day. Talked with him about Hormel Shake and he is open to this  switch. Son is also interested in prostat and plans to add it and beneprotein to ginger ale as patient drinks ginger ale frequently throughout the day. SLP saw patient earlier today and recommended regular texture foods and thin liquids. Son reports that patient "doesn't have the best teeth" and confirms she prefers items that require little to no chewing.   Son and patient report that appetite has been fair to poor. Patient is unable to provide information about appetite and intakes PTA. Patient was unable to provide any information about weight trends; son feels that weight has been fairly stable for the past few months other than fluctuations in the hospital which are attributable to fluid.  Per chart review, weight on admission was 123 lb and most recently recorded weight, on 2/7, was again 123 lb. PTA, the most recent weight was on 11/06/16 when she weighed 111 lb.    Labs reviewed; CBGs: 121 and 219 mg/dl, BUN: 25 mg/dl, Ca: 8.3 mg/dl. Medications reviewed; 100 mg colace/day, sliding scale novolog, sliding scale novolog, 40 mg oral protonix BID.      NUTRITION - FOCUSED PHYSICAL EXAM:    Most Recent Value  Orbital Region  Moderate depletion  Upper Arm Region  Moderate depletion  Thoracic and Lumbar Region  Unable to assess  Buccal Region  Moderate depletion  Temple Region  Mild depletion  Clavicle Bone Region  Moderate depletion  Clavicle and Acromion Bone Region  Severe depletion  Scapular Bone Region  Unable to assess  Dorsal Hand  Severe depletion  Patellar Region  Unable to assess  Anterior Thigh Region  Unable to assess  Posterior Calf Region  Unable to assess  Edema (RD Assessment)  Unable to assess  Hair  Reviewed  Eyes  Reviewed  Mouth  Reviewed  Skin  Reviewed  Nails  Reviewed       Diet Order:   Diet Order            Diet regular Room service appropriate? Yes; Fluid consistency: Thin  Diet effective now              EDUCATION NEEDS:   No education needs  have been identified at this time  Skin:  Skin Assessment: Reviewed RN Assessment  Last BM:  2/9  Height:   Ht Readings from Last 1 Encounters:  09/28/19 5\' 3"  (1.6 m)    Weight:   Wt Readings from Last 1 Encounters:  10/02/19 55.7 kg    Ideal Body Weight:  52.3 kg  BMI:  Body mass index is 21.75 kg/m.  Estimated Nutritional Needs:   Kcal:  1670-1840 kcal  Protein:  75-90 grams  Fluid:  >/= 1.8 L/day     11/30/19, MS, RD, LDN, CNSC Inpatient Clinical Dietitian RD pager # available in [facility]  After hours/weekend pager # available in [facility]

## 2019-10-04 NOTE — [place] FL2 (Signed)
[place] MEDICAID FL2 LEVEL OF CARE SCREENING TOOL     IDENTIFICATION  Patient Name: [name] Birthdate: [birth year] Sex: female Admission Date (Current Location): 09/27/2019  [facility] and [place] Number:  Producer, television/film/video and Address:  [facility],  [address] [place], [place] [address]      Provider Number: [id number]  Attending Physician Name and Address:  Darlin Drop, DO  Relative Name and Phone Number:  Sidd Pecore [phone]    Current Level of Care: Hospital Recommended Level of Care: Assisted Living Facility Prior Approval Number:    Date Approved/Denied:   PASRR Number:    Discharge Plan: Other (Comment)([facility])    Current Diagnoses: Patient Active Problem List   Diagnosis Date Noted  . Bilateral pleural effusion 10/03/2019  . Acute respiratory failure with hypoxemia (HCC) 10/03/2019  . Severe sepsis (HCC) 09/28/2019  . Fever 09/28/2019  . Generalized weakness 09/28/2019  . Dehydration 09/28/2019  . LLL pneumonia 09/27/2019  . Mild cognitive impairment with memory loss 06/08/2014  . SORE THROAT 04/10/2010  . Dyspnea 10/25/2009  . Cough 10/25/2009  . HYPERLIPIDEMIA 10/24/2009  . DEPRESSION 10/24/2009  . Essential hypertension 10/24/2009  . CAD 10/24/2009  . ALLERGIC RHINITIS, SEASONAL 10/24/2009  . Asthma 10/24/2009  . OSTEOPENIA 10/24/2009    Orientation RESPIRATION BLADDER Height & Weight     Self  Normal Continent Weight: 55.7 kg Height:  5\' 3"  (160 cm)  BEHAVIORAL SYMPTOMS/MOOD NEUROLOGICAL BOWEL NUTRITION STATUS      Continent Diet(regular;thin liquids.)  AMBULATORY STATUS COMMUNICATION OF NEEDS Skin   Limited Assist Verbally(little Engish understanding) Normal                       Personal Care Assistance Level of Assistance  Bathing, Feeding, Dressing Bathing Assistance: Limited assistance Feeding assistance: Limited assistance Dressing Assistance: Limited assistance     Functional  Limitations Info  Sight, Hearing, Speech Sight Info: Adequate Hearing Info: Adequate Speech Info: Adequate    SPECIAL CARE FACTORS FREQUENCY  Speech therapy(HH nursing-assessment;disease management)     PT Frequency: 5x week OT Frequency: 5x week     Speech Therapy Frequency: 1x week      Contractures Contractures Info: Not present    Additional Factors Info  Code Status, Allergies Code Status Info: Full code Allergies Info: Sulfonamide Derivatives           Current Medications (10/04/2019):  This is the current hospital active medication list Current Facility-Administered Medications  Medication Dose Route Frequency Provider Last Rate Last Admin  . acetaminophen (TYLENOL) tablet 650 mg  650 mg Oral Q6H PRN Howerter, Justin B, DO   650 mg at 10/03/19 1301   Or  . acetaminophen (TYLENOL) suppository 650 mg  650 mg Rectal Q6H PRN Howerter, Justin B, DO      . albuterol (PROVENTIL) (2.5 MG/3ML) 0.083% nebulizer solution 2.5 mg  2.5 mg Inhalation Q4H PRN Howerter, Justin B, DO   2.5 mg at 10/01/19 0528  . amLODipine (NORVASC) tablet 5 mg  5 mg Oral Daily 11/29/19, MD   5 mg at 10/04/19 1034   And  . irbesartan (AVAPRO) tablet 300 mg  300 mg Oral Daily 12/02/19, MD   300 mg at 10/04/19 1034  . aspirin chewable tablet 81 mg  81 mg Oral Daily Howerter, Justin B, DO   81 mg at 10/04/19 1034  . cefdinir (OMNICEF) capsule 300 mg  300 mg Oral  Q12H Irene Pap N, DO   300 mg at 10/04/19 1041  . clopidogrel (PLAVIX) tablet 75 mg  75 mg Oral Q breakfast Howerter, Justin B, DO   75 mg at 10/04/19 1034  . dextromethorphan-guaiFENesin (MUCINEX DM) 30-600 MG per 12 hr tablet 1 tablet  1 tablet Oral BID Lovey Newcomer T, NP   1 tablet at 10/04/19 1034  . diclofenac Sodium (VOLTAREN) 1 % topical gel 2 g  2 g Topical QID Hall, Carole N, DO      . docusate sodium (COLACE) capsule 100 mg  100 mg Oral Daily Howerter, Justin B, DO   100 mg at 10/04/19 1035  . donepezil (ARICEPT) tablet  10 mg  10 mg Oral QHS Howerter, Justin B, DO   10 mg at 10/03/19 2147  . enoxaparin (LOVENOX) injection 40 mg  40 mg Subcutaneous Q24H Hall, Carole N, DO   40 mg at 10/03/19 1731  . escitalopram (LEXAPRO) tablet 5 mg  5 mg Oral Daily Hall, Carole N, DO      . feeding supplement (GLUCERNA SHAKE) (GLUCERNA SHAKE) liquid 237 mL  237 mL Oral TID BM Hall, Carole N, DO   237 mL at 10/04/19 1035  . furosemide (LASIX) tablet 20 mg  20 mg Oral Daily Charolette Forward, MD      . guaiFENesin (ROBITUSSIN) 100 MG/5ML solution 200 mg  10 mL Oral Q6H PRN Olalere, Adewale A, MD      . insulin aspart (novoLOG) injection 0-9 Units  0-9 Units Subcutaneous TID WC Howerter, Justin B, DO   2 Units at 10/03/19 1732  . ipratropium (ATROVENT) nebulizer solution 0.5 mg  0.5 mg Nebulization TID Irene Pap N, DO   0.5 mg at 10/04/19 0752  . ketorolac (ACULAR) 0.5 % ophthalmic solution 1 drop  1 drop Both Eyes Q2000 Blount, Xenia T, NP   1 drop at 10/03/19 2148  . levalbuterol (XOPENEX) nebulizer solution 0.63 mg  0.63 mg Nebulization TID Irene Pap N, DO   0.63 mg at 10/04/19 8341  . metoprolol succinate (TOPROL-XL) 24 hr tablet 25 mg  25 mg Oral Daily Irene Pap N, DO   25 mg at 10/04/19 1034  . mometasone-formoterol (DULERA) 100-5 MCG/ACT inhaler 2 puff  2 puff Inhalation BID Howerter, Justin B, DO   2 puff at 10/04/19 0752  . naloxone [facility]) injection 0.4 mg  0.4 mg Intravenous PRN Irene Pap N, DO   0.4 mg at 10/04/19 1214  . pantoprazole (PROTONIX) EC tablet 40 mg  40 mg Oral BID AC Tanda Rockers, MD   40 mg at 10/04/19 1035  . rosuvastatin (CRESTOR) tablet 5 mg  5 mg Oral q1800 Howerter, Justin B, DO   5 mg at 10/03/19 1731  . sodium chloride flush (NS) 0.9 % injection 3 mL  3 mL Intravenous Q12H Howerter, Justin B, DO   3 mL at 10/04/19 1044     Discharge Medications: Please see discharge summary for a list of discharge medications.  Relevant Imaging Results:  Relevant Lab Results:   Additional  Information [id number], [place], [place]

## 2019-10-04 NOTE — TOC Progression Note (Signed)
Transition of Care [facility]) - Progression Note    Patient Details  Name: [name] MRN: [id number] Date of Birth: 01-Feb-1923  [facility] CM/SW Contact  Jaliel Deavers, Olegario Messier, RN Phone Number: 10/04/2019, 1:17 PM  Clinical Narrative: Faxed updated fl2 to [facility]-rep Imane-awating outcome if able to accept back-[facility] to provide HHRN/PT/OT/ST-able to accept.May need 02-will await 02 sats & dx documented,& orders if qualify.[facility] already following if needed.      Expected Discharge Plan: Home w Home Health Services Barriers to Discharge: Continued Medical Work up  Expected Discharge Plan and Services Expected Discharge Plan: Home w Home Health Services   Discharge Planning Services: CM Consult   Living arrangements for the past 2 months: Assisted Living Facility                           HH Arranged: RN, Disease Management, PT, OT, Speech Therapy HH Agency: [facility] ([facility]) Date HH Agency Contacted: 10/04/19 Time HH Agency Contacted: 1317 Representative spoke with at [facility] Agency: Kathlene November   Social Determinants of Health (SDOH) Interventions    Readmission Risk Interventions No flowsheet data found.

## 2019-10-04 NOTE — Progress Notes (Signed)
PROGRESS NOTE  BRANDON SCARBROUGH HGD:[id number] DOB: 1923/02/10 DOA: 09/27/2019 PCP: Chilton Greathouse, MD  HPI/Recap of past 24 hours: [name] is a 86 y.o. female with medical history significant for dementia, type 2 diabetes mellitus, ischemic CVA, hypertension, urinary incontinence, asthma, who is admitted to [facility] on 09/27/2019 with severe sepsis due to left lower lobe pneumonia after presenting from ALF for evaluation of fever.   Hypoxic on presentation.  Responded well to empiric IV antibiotics.  CTA negative for PE.  Hospital course complicated by pulmonary edema and bilateral pleural effusion in the setting of IV fluid hydration.  Improved with diuresing.  2D echo showed LVEF 70-75% with grade I DD.  Post left thoracentesis by IR Dr. Loreta Ave on 10/02/19 with 570 cc of amber fluid removed.  PCCM following.  Abnormal 12 lead EKG with mildly elevated troponin.  Cardiology consulted on 10/02/19.  Appreciate specialists assistance.  Drowsy on 2/8 after taking Tussionex for cough associated pleuritic pain.  The hydrocodone in the solution was reversed with Narcan 0.4 mg x 2 with improvement.  Tussionex was added to her list of allergies.  10/04/19:  Seen and examined.  Looks improved this morning but still weak and appetite is not back to her baseline.  Dietitian/PT/OT assisting.   Assessment/Plan: Principal Problem:   LLL pneumonia Active Problems:   Essential hypertension   Cough   Severe sepsis (HCC)   Fever   Generalized weakness   Dehydration   Bilateral pleural effusion   Acute respiratory failure with hypoxemia (HCC)   Resolved sepsis 2/2 LLL CAP Presented with leukocytosis, tachycardia, chest x-ray with findings suggestive of left lower lobe pneumonia.   Completed 6 days of IV abx empirically IV azithromycin and Rocephin. Switched to po abx cefdinir 300 mg BID x 4 days (day 2/4). Procalcitonin and lactic acid normalized on 10/03/2019. Afebrile with no  leukocytosis Nontoxic-appearing Home O2 evaluation prior to dc Encourage use of incentive spirometer and flutter valve. Blood cultures and urine culture negative final.   Mobilize as tolerated  Acute hypoxic respiratory failure secondary to left lower lobe community-acquired pneumonia complicated by pulmonary edema and bilateral pleural effusions left greater than right post diuresing and left thoracentesis 2/7 by IR. Not on O2 supplementation at baseline Ultrasound-guided left thoracentesis done by interventional radiology Dr. Loreta Ave with 570 cc of amber fluid removed.  FINAL MICROSCOPIC DIAGNOSIS:  - Reactive mesothelial cells present. Continue bronchodilators Continue incentive spirometer and flutter valve. PCCM following.  Bilateral pleural effusion left greater than right likely in the setting of pneumonia superimposed by volume overload, iatrogenic from IV fluid hydration Post ultrasound-guided left thoracentesis Management as stated above Responded well to diuresis with good urine output IV Lasix dced  Acute pulmonary edema in the setting of volume overload Continue to closely monitor volume status No evidence of renal failure  Resolved Hyperkalemia Potassium 4.8>> 4.4 Resolved post diuresing. Continue to monitor electrolytes and adjust as needed  Pleuritic pain in the setting of intractable cough Received Tussionex which was reversed with Narcan 0.4 mg x 2 doses Added Tussionex in her list of allergies Avoid opiates  Elevated troponin likely demand ischemia in the setting of hypoxia Denies any anginal symptoms Troponin peaked at 125 and trended down to 42. No sign of acute ischemia on twelve-lead EKG.    Chronic diastolic CHF 2D echo done on 10/01/2019 showed LVEF 70 to 75% with grade 1 diastolic dysfunction Continue to monitor volume status Continue strict I's and O's  and daily weights Cardiology following  Moderate protein calorie malnutrition Albumin 3.6 BMI  21 Loss of muscle mass Dietitian consulted for nutritional assistance Continue Glucerna/oral supplement  Remote history of dysphagia Speech consulted to assess Recommendation per speech therapy Aspirations precautions  Elevated D-dimer in the setting of hypoxia CTA negative for PE. Bilateral lower extremity Doppler ultrasound negative for DVT.  History of asthma Continue home medications Continue to maintain O2 saturation greater than 92%.  Type 2 DM A1C 7.2 on 09/28/2019. Continue ISS  Resolved post repletion: Hypomagnesemia  Mag 1.5>> 2.4>>1.7>> 2.3  Hx ischemic CVA Continue ASA, plavix and crestor  Essential HTN BP is at goal Continue home medications IV hydralazine 2 mg every 6 hours as needed for SBP greater than 160 Avoid hypotension  Physical debility/Generalized weakness Continue PT OT with assistance and fall precautions. Out of bed to chair as tolerated with assistance and fall precautions.  Chronic anxiety/depression Discussed with her PCP Dr. Felipa Eth via phone on 2/9.  [name] has been on Lexapro for about 5 years. Dr. Felipa Eth will address it at her next follow up appointment.   DVT prophylaxis: SCDs, sq lovenox daily Code Status: Full code Family Communication: Updated the patient's son [name] at bedside on 10/04/2019.  Disposition Plan: From ALF; anticipate dc to ALF once respiratory status close to baseline likely 10/06/2019. Barrier to dc ongoing weakness and poor oral intake.   Consults called:   PCCM Cardiology       Objective: Vitals:   10/04/19 1034 10/04/19 1325 10/04/19 1330 10/04/19 1429  BP: (!) 145/62  127/60   Pulse: 90  79   Resp:      Temp:   99.2 F (37.3 C)   TempSrc:   Oral   SpO2:  (!) 88%  95%  Weight:      Height:        Intake/Output Summary (Last 24 hours) at 10/04/2019 1624 Last data filed at 10/04/2019 1400 Gross per 24 hour  Intake 253 ml  Output 150 ml  Net 103 ml   Filed Weights   09/30/19 0500  10/01/19 0457 10/02/19 0500  Weight: 61.1 kg 62.6 kg 55.7 kg    Exam:  . General: 86 y.o. year-old female pleasant, weak appearing.  Alert and in no acute distress. . Cardiovascular: Regular rate and rhythm no rubs or gallops. Marland Kitchen Respiratory: Mild rales at bases no wheezing noted.  Poor inspiratory effort. . Abdomen: Soft nontender normal bowel sounds present . Musculoskeletal: No lower extremity edema bilaterally.   Marland Kitchen Psychiatry: Mood is appropriate for condition and setting.   Data Reviewed: CBC: Recent Labs  Lab 09/29/19 0507 09/29/19 0507 09/30/19 0409 10/01/19 0512 10/02/19 0549 10/03/19 0458 10/04/19 0507  WBC 17.2*   < > 13.3* 10.5 10.0 10.9* 8.0  NEUTROABS 14.8*  --  10.9* 8.0* 6.6 7.2  --   HGB 11.9*   < > 12.0 12.5 12.9 11.2* 10.8*  HCT 36.8   < > 37.2 38.5 39.5 35.0* 34.4*  MCV 90.9   < > 90.7 90.0 89.0 90.2 91.0  PLT 238   < > 256 270 297 262 264   < > = values in this interval not displayed.   Basic Metabolic Panel: Recent Labs  Lab 09/28/19 0520 09/28/19 0520 09/29/19 0507 09/29/19 0507 09/30/19 0409 09/30/19 1250 10/01/19 0512 10/02/19 0549 10/03/19 0458 10/03/19 1709 10/04/19 0507  NA 139   < > 138   < > 140  --  141  141 138  --  136  K 3.6   < > 3.5   < > 3.1*   < > 3.4* 3.9 4.8 4.7 4.4  CL 105   < > 106   < > 108  --  102 102 100  --  98  CO2 23   < > 26   < > 26  --  24 30 31   --  31  GLUCOSE 183*   < > 142*   < > 145*  --  133* 143* 112*  --  128*  BUN 16   < > 22   < > 16  --  11 13 18   --  25*  CREATININE 0.61   < > 0.53   < > 0.53  --  0.48 0.61 0.68  --  0.70  CALCIUM 8.2*   < > 8.2*   < > 8.1*  --  7.9* 8.5* 8.3*  --  8.3*  MG 1.5*  --  2.4  --   --   --   --  1.9 1.7 2.3  --   PHOS  --   --  1.7*  --   --   --   --  2.6  --  4.4  --    < > = values in this interval not displayed.   GFR: Estimated Creatinine Clearance: 35.6 mL/min (by C-G formula based on SCr of 0.7 mg/dL). Liver Function Tests: Recent Labs  Lab 09/27/19 1718   AST 20  ALT 16  ALKPHOS 48  BILITOT 1.1  PROT 6.8  ALBUMIN 3.6   No results for input(s): LIPASE, AMYLASE in the last 168 hours. No results for input(s): AMMONIA in the last 168 hours. Coagulation Profile: Recent Labs  Lab 09/27/19 1718  INR 1.1   Cardiac Enzymes: No results for input(s): CKTOTAL, CKMB, CKMBINDEX, TROPONINI in the last 168 hours. BNP (last 3 results) No results for input(s): PROBNP in the last 8760 hours. HbA1C: No results for input(s): HGBA1C in the last 72 hours. CBG: Recent Labs  Lab 10/03/19 1151 10/03/19 1656 10/03/19 2025 10/04/19 0750 10/04/19 1117  GLUCAP 198* 177* 179* 121* 219*   Lipid Profile: No results for input(s): CHOL, HDL, LDLCALC, TRIG, CHOLHDL, LDLDIRECT in the last 72 hours. Thyroid Function Tests: No results for input(s): TSH, T4TOTAL, FREET4, T3FREE, THYROIDAB in the last 72 hours. Anemia Panel: No results for input(s): VITAMINB12, FOLATE, FERRITIN, TIBC, IRON, RETICCTPCT in the last 72 hours. Urine analysis:    Component Value Date/Time   COLORURINE YELLOW 09/27/2019 1924   APPEARANCEUR CLEAR 09/27/2019 1924   LABSPEC 1.017 09/27/2019 1924   PHURINE 6.0 09/27/2019 1924   GLUCOSEU >=500 (A) 09/27/2019 1924   HGBUR NEGATIVE 09/27/2019 1924   BILIRUBINUR NEGATIVE 09/27/2019 1924   KETONESUR 5 (A) 09/27/2019 1924   PROTEINUR 30 (A) 09/27/2019 1924   NITRITE NEGATIVE 09/27/2019 1924   LEUKOCYTESUR NEGATIVE 09/27/2019 1924   Sepsis Labs: @LABRCNTIP (procalcitonin:4,lacticidven:4)  ) Recent Results (from the past 240 hour(s))  Blood Culture (routine x 2)     Status: None   Collection Time: 09/27/19  5:18 PM   Specimen: BLOOD  Result Value Ref Range Status   Specimen Description   Final    BLOOD LEFT ANTECUBITAL Performed at [facility], [address] [place], [place]    Special Requests   Final    BOTTLES DRAWN AEROBIC AND ANAEROBIC Blood Culture results may not be optimal due to an  excessive volume of blood received in culture bottles Performed at [facility] [address], [place], [place] [address]    Culture   Final    NO GROWTH 5 DAYS Performed at [facility], [place] [address] [place], [place] [address]    Report Status 10/02/2019 FINAL  Final  Respiratory Panel by RT PCR (Flu A&B, Covid) - Nasopharyngeal Swab     Status: None   Collection Time: 09/27/19  5:23 PM   Specimen: Nasopharyngeal Swab  Result Value Ref Range Status   SARS Coronavirus 2 by RT PCR NEGATIVE NEGATIVE Final    Comment: (NOTE) SARS-CoV-2 target nucleic acids are NOT DETECTED. The SARS-CoV-2 RNA is generally detectable in upper respiratoy specimens during the acute phase of infection. The lowest concentration of SARS-CoV-2 viral copies this assay can detect is 131 copies/mL. A negative result does not preclude SARS-Cov-2 infection and should not be used as the sole basis for treatment or other patient management decisions. A negative result may occur with  improper specimen collection/handling, submission of specimen other than nasopharyngeal swab, presence of viral mutation(s) within the areas targeted by this assay, and inadequate number of viral copies (<131 copies/mL). A negative result must be combined with clinical observations, patient history, and epidemiological information. The expected result is Negative. Fact Sheet for Patients:  PinkCheek.be Fact Sheet for Healthcare Providers:  GravelBags.it This test is not yet ap proved or cleared by the [place] FDA and  has been authorized for detection and/or diagnosis of SARS-CoV-2 by FDA under an Emergency Use Authorization (EUA). This EUA will remain  in effect (meaning this test can be used) for the duration of the COVID-19 declaration under Section 564(b)(1) of the Act, 21 U.S.C. section 360bbb-3(b)(1), unless the authorization is  terminated or revoked sooner.    Influenza A by PCR NEGATIVE NEGATIVE Final   Influenza B by PCR NEGATIVE NEGATIVE Final    Comment: (NOTE) The Xpert Xpress SARS-CoV-2/FLU/RSV assay is intended as an aid in  the diagnosis of influenza from Nasopharyngeal swab specimens and  should not be used as a sole basis for treatment. Nasal washings and  aspirates are unacceptable for Xpert Xpress SARS-CoV-2/FLU/RSV  testing. Fact Sheet for Patients: PinkCheek.be Fact Sheet for Healthcare Providers: GravelBags.it This test is not yet approved or cleared by the [place] FDA and  has been authorized for detection and/or diagnosis of SARS-CoV-2 by  FDA under an Emergency Use Authorization (EUA). This EUA will remain  in effect (meaning this test can be used) for the duration of the  Covid-19 declaration under Section 564(b)(1) of the Act, 21  U.S.C. section 360bbb-3(b)(1), unless the authorization is  terminated or revoked. Performed at [facility], [place] [address], [place], [place] [address]   Blood Culture (routine x 2)     Status: None   Collection Time: 09/27/19  5:33 PM   Specimen: BLOOD  Result Value Ref Range Status   Specimen Description   Final    BLOOD RIGHT ANTECUBITAL Performed at [place] [address] [place], [place] [address]    Special Requests   Final    BOTTLES DRAWN AEROBIC AND ANAEROBIC Blood Culture adequate volume Performed at [facility] [address] [place], [place] [address]    Culture   Final    NO GROWTH 5 DAYS Performed at [facility], [place] [address] [place],  [address]    Report Status 10/02/2019  FINAL  Final  Urine culture     Status: None   Collection Time: 09/27/19  7:24 PM   Specimen: In/Out Cath Urine  Result Value Ref Range Status   Specimen Description   Final    IN/OUT CATH URINE Performed at [facility], [address] [place], [place] [address]    Special Requests   Final    NONE Performed at [facility], [address], [place], [place] [address]    Culture   Final    NO GROWTH Performed at [facility], [address] [place], [place] [address]    Report Status 09/28/2019 FINAL  Final      Studies: No results found.  Scheduled Meds: . amLODipine  5 mg Oral Daily   And  . irbesartan  300 mg Oral Daily  . aspirin  81 mg Oral Daily  . cefdinir  300 mg Oral Q12H  . clopidogrel  75 mg Oral Q breakfast  . dextromethorphan-guaiFENesin  1 tablet Oral BID  . diclofenac Sodium  2 g Topical QID  . docusate sodium  100 mg Oral Daily  . donepezil  10 mg Oral QHS  . enoxaparin (LOVENOX) injection  40 mg Subcutaneous Q24H  . escitalopram  5 mg Oral Daily  . feeding supplement (PRO-STAT SUGAR FREE 64)  30 mL Oral TID BM  . insulin aspart  0-9 Units Subcutaneous TID WC  . ipratropium  0.5 mg Nebulization TID  . ketorolac  1 drop Both Eyes Q2000  . levalbuterol  0.63 mg Nebulization TID  . metoprolol succinate  25 mg Oral Daily  . mometasone-formoterol  2 puff Inhalation BID  . multivitamin with minerals  1 tablet Oral Daily  . pantoprazole  40 mg Oral BID AC  . protein supplement  1 Scoop Oral TID WC  . rosuvastatin  5 mg Oral q1800  . sodium chloride flush  3 mL Intravenous Q12H    Continuous Infusions:    LOS: 7 days     Darlin Drop, MD [facility] Pager [phone]  If 7PM-7AM, please contact night-coverage [email or web address] Password [facility] 10/04/2019, 4:24 PM

## 2019-10-04 NOTE — Progress Notes (Signed)
Physical Therapy Treatment Patient Details Name: [name] MRN: [id number] DOB: 08/03/1923 Today's Date: 10/04/2019    History of Present Illness 86 yo female admitted with Pna, weakness, sepsis. Hx of dementia, CVA, Dm, asthma, incontinence    PT Comments    Pt is lethargic today. She arouses briefly to verbal stimulii. Noted pt required narcan yesterday after becoming lethargic after receiving tussin. Pt again received narcan 40 minutes prior to this PT session. Pt performed sit to stand x 2 with +2 mod assist. Pt stood ~60 seconds with RW, tolerance limited by fatigue and dizziness. SaO2 88% on room air, 94% on 1L O2. BP sitting 124/58, standing 114/57. HR 93 standing, 84 sitting. Decreased activity tolerance today.    Follow Up Recommendations  SNF;Supervision/Assistance - 24 hour(HHPT at ALF if facility is able to provide increased supervision/assist)     Equipment Recommendations  None recommended by PT    Recommendations for Other Services       Precautions / Restrictions Precautions Precautions: Fall Precaution Comments: monitor O2 Restrictions Weight Bearing Restrictions: No    Mobility  Bed Mobility               General bed mobility comments: oob in recliner  Transfers Overall transfer level: Needs assistance Equipment used: Rolling walker (2 wheeled) Transfers: Sit to/from Stand Sit to Stand: Mod assist;+2 physical assistance         General transfer comment: Assist to rise, stabilize, control descent. sit to stand x 2 trials, pt stood ~60 seconds x 2 with RW, standing tolerance limited by dizziness and fatigue. VCs to lift head in standing. BP sitting 124/58, standing 114/57; SaO2 88% on room air at rest, 94% on 1L at rest.  Ambulation/Gait                 Stairs             Wheelchair Mobility    Modified Rankin (Stroke Patients Only)       Balance Overall balance assessment: Needs assistance   Sitting balance-Leahy  Scale: Fair     Standing balance support: Bilateral upper extremity supported Standing balance-Leahy Scale: Poor                              Cognition Arousal/Alertness: Lethargic Behavior During Therapy: WFL for tasks assessed/performed Overall Cognitive Status: History of cognitive impairments - at baseline                                 General Comments: following all commands and speaks enough [place].      Exercises      General Comments        Pertinent Vitals/Pain Pain Assessment: No/denies pain    Home Living                      Prior Function            PT Goals (current goals can now be found in the care plan section) Acute Rehab PT Goals Patient Stated Goal: son would like pt to return [name] PT Goal Formulation: With family Time For Goal Achievement: 10/12/19 Potential to Achieve Goals: Fair Progress towards PT goals: Not progressing toward goals - comment(lethargy)    Frequency    Min 3X/week      PT Plan Current plan  remains appropriate    Co-evaluation              AM-PAC PT "6 Clicks" Mobility   Outcome Measure  Help needed turning from your back to your side while in a flat bed without using bedrails?: A Little Help needed moving from lying on your back to sitting on the side of a flat bed without using bedrails?: A Little Help needed moving to and from a bed to a chair (including a wheelchair)?: A Lot Help needed standing up from a chair using your arms (e.g., wheelchair or bedside chair)?: A Lot Help needed to walk in hospital room?: Total Help needed climbing 3-5 steps with a railing? : Total 6 Click Score: 12    End of Session Equipment Utilized During Treatment: Gait belt;Oxygen Activity Tolerance: Patient limited by fatigue Patient left: in chair;with call bell/phone within reach;with chair alarm set;with family/visitor present   PT Visit Diagnosis: Muscle weakness  (generalized) (M62.81);Difficulty in walking, not elsewhere classified (R26.2)     Time: 7505-1833 PT Time Calculation (min) (ACUTE ONLY): 26 min  Charges:  $Therapeutic Activity: 23-37 mins                     Ralene Bathe Kistler PT 10/04/2019  Acute Rehabilitation Services Pager [phone] Office [phone]

## 2019-10-04 NOTE — Evaluation (Signed)
Clinical/Bedside Swallow Evaluation Patient Details  Name: [name] MRN: [id number] Date of Birth: 03-16-1923  Today's Date: 10/04/2019 Time: SLP Start Time (ACUTE ONLY): 1001 SLP Stop Time (ACUTE ONLY): 1037 SLP Time Calculation (min) (ACUTE ONLY): 36 min  Past Medical History:  Past Medical History:  Diagnosis Date  . Diabetes ([place])   . Hearing loss   . Thalamic infarction [facility])    Past Surgical History:  Past Surgical History:  Procedure Laterality Date  . fractured rib     HPI:  [name] is a 86 yo female adm to [facility] with respiratory difficulties.  Found to have LLL pleural effusion and is s/p thoracentesis.  Last CXR showed mild vascular congestion, pleural effusion similar to prior exam.  Pt resides at ALF and has h/o thalamic CVAs, dementia and hearing loss.  She is being treated for pna with ABX and currently is taking a PPI but is not on one prior to admission.  Given pt has cognitive deficits, her son provided most information.  Pt currently is coughing and "snorting" expectorating viscous white secretions frequently with and without intake.  She reports this has been ongoing for 2 weeks and is staying at the same level.  He states he speaks to his mother frequently and does not recall her coughing, "snorting" and expectorating on the phone during prior conversations.   Assessment / Plan / Recommendation Clinical Impression  Pt with negative CN exam but has known h/o esophageal dysmotility.  No indication of aspiration with po observed including entire banana and water (2 ounces).  Pt currently is coughing and "snorting" expectorating viscous white secretions frequently with and without intake.  Given h/o esophageal deficits dating back to 2006, ? if this issue could contribute to pt's pulmonary status. With frequent "snorting" also question if allergies are an issue for her.  Recommend proceed with esophagram to reevaluate esophagus. SlP to follow up next date. Orders  obtain and pt/family, RN, NT and xray informed. SLP Visit Diagnosis: Dysphagia, unspecified (R13.10)    Aspiration Risk  Mild aspiration risk    Diet Recommendation Regular;Thin liquid   Liquid Administration via: Cup;Straw Medication Administration: (crush if large and not contraindicated) Compensations: Minimize environmental distractions;Slow rate;Small sips/bites Postural Changes: Seated upright at 90 degrees;Remain upright for at least 30 minutes after po intake    Other  Recommendations Oral Care Recommendations: Oral care BID   Follow up Recommendations (tbd)      Frequency and Duration min 1 x/week  1 week       Prognosis Prognosis for Safe Diet Advancement: Good Barriers to Reach Goals: Cognitive deficits      Swallow Study   General Date of Onset: 10/04/19 HPI: [name] is a 86 yo female adm to [facility] with respiratory difficulties.  Found to have LLL pleural effusion and is s/p thoracentesis.  Last CXR showed mild vascular congestion, pleural effusion similar to prior exam.  Pt resides at ALF and has h/o thalamic CVAs, dementia and hearing loss.  She is being treated for pna with ABX and currently is taking a PPI but is not on one prior to admission.  Given pt has cognitive deficits, her son provided most information.  Pt currently is coughing and "snorting" expectorating viscous white secretions frequently with and without intake.  She reports this has been ongoing for 2 weeks and is staying at the same level.  He states he speaks to his mother frequently and does not recall her coughing, "snorting" and expectorating  on the phone during prior conversations. Type of Study: Bedside Swallow Evaluation Diet Prior to this Study: Regular;Thin liquids Temperature Spikes Noted: No Respiratory Status: Nasal cannula(1) History of Recent Intubation: No Behavior/Cognition: Alert;Cooperative;Doesn't follow directions Oral Cavity Assessment: Within Functional Limits Oral Care  Completed by SLP: No Oral Cavity - Dentition: Other (Comment);Adequate natural dentition Vision: Functional for self-feeding Self-Feeding Abilities: Able to feed self Patient Positioning: Upright in bed Baseline Vocal Quality: Normal Volitional Cough: Strong Volitional Swallow: Able to elicit(with effort)    Oral/Motor/Sensory Function Overall Oral Motor/Sensory Function: Generalized oral weakness(no focal CN deficits)   Ice Chips Ice chips: Not tested   Thin Liquid Thin Liquid: Within functional limits Presentation: Straw Pharyngeal  Phase Impairments: Throat Clearing - Delayed    Nectar Thick Nectar Thick Liquid: Not tested   Honey Thick Honey Thick Liquid: Not tested   Puree Puree: Not tested   Solid     Solid: Within functional limits Presentation: Self Fed Other Comments: banana      Chales Abrahams 10/04/2019,11:23 AM   Rolena Infante, MS [facility] SLP Acute Rehab Services Office [phone]

## 2019-10-04 NOTE — Progress Notes (Signed)
Subjective:  Patient sleeping comfortably in bed as per son patient was sleepy yesterday and did not get out of bed.  Appetite remains poor no further chest pain or shortness of breath.  Objective:  Vital Signs in the last 24 hours: Temp:  [98.5 F (36.9 C)-100.7 F (38.2 C)] 98.6 F (37 C) (02/09 0612) Pulse Rate:  [75-109] 82 (02/09 0752) Resp:  [16-20] 16 (02/09 0752) BP: (113-140)/(50-62) 140/60 (02/09 0612) SpO2:  [92 %-100 %] 94 % (02/09 0752)  Intake/Output from previous day: 02/08 0701 - 02/09 0700 In: -  Out: 150 [Urine:150] Intake/Output from this shift: No intake/output data recorded.  Physical Exam: Exam unchanged  Lab Results: Recent Labs    10/03/19 0458 10/04/19 0507  WBC 10.9* 8.0  HGB 11.2* 10.8*  PLT 262 264   Recent Labs    10/03/19 0458 10/03/19 0458 10/03/19 1709 10/04/19 0507  NA 138  --   --  136  K 4.8   < > 4.7 4.4  CL 100  --   --  98  CO2 31  --   --  31  GLUCOSE 112*  --   --  128*  BUN 18  --   --  25*  CREATININE 0.68  --   --  0.70   < > = values in this interval not displayed.   No results for input(s): TROPONINI in the last 72 hours.  Invalid input(s): CK, MB Hepatic Function Panel No results for input(s): PROT, ALBUMIN, AST, ALT, ALKPHOS, BILITOT, BILIDIR, IBILI in the last 72 hours. No results for input(s): CHOL in the last 72 hours. No results for input(s): PROTIME in the last 72 hours.  Imaging: Imaging results have been reviewed and DG Chest 2 View  Result Date: 10/03/2019 CLINICAL DATA:  Status post thoracentesis EXAM: CHEST - 2 VIEW COMPARISON:  10/02/2019 FINDINGS: Cardiac shadow is mildly enlarged but stable. Aortic calcifications are seen. Small pleural effusions are noted similar to that seen on the prior exam. Mild vascular congestion is noted. IMPRESSION: Small effusions similar to that noted on the prior exam. Mild vascular congestion. Electronically Signed   By: Inez Catalina M.D.   On: 10/03/2019 12:06   DG  Chest Port 1 View  Result Date: 10/02/2019 CLINICAL DATA:  Status post left thoracentesis EXAM: PORTABLE CHEST 1 VIEW COMPARISON:  Earlier same day FINDINGS: 10:31 a.m. lungs appear hyperexpanded. Interstitial markings are diffusely coarsened with chronic features. Bibasilar collapse/consolidative change noted with bilateral pleural effusions, left greater than right. Left pleural effusion has certainly decreased since the film earlier today and there is no evidence for pneumothorax status post thoracentesis. Bones are diffusely demineralized. Telemetry leads overlie the chest. IMPRESSION: Interval decrease in left pleural effusion status post thoracentesis. No evidence for pneumothorax. Electronically Signed   By: Misty Stanley M.D.   On: 10/02/2019 12:54   US THORACENTESIS ASP PLEURAL SPACE W/IMG GUIDE  Result Date: 10/02/2019 INDICATION: 86 year old female with left-sided pleural effusion and pneumonia EXAM: ULTRASOUND GUIDED LEFT THORACENTESIS MEDICATIONS: None. COMPLICATIONS: None PROCEDURE: An ultrasound guided thoracentesis was thoroughly discussed with the patient and questions answered. The benefits, risks, alternatives and complications were also discussed. The patient understands and wishes to proceed with the procedure. Written consent was obtained. Ultrasound was performed to localize and mark an adequate pocket of fluid in the left chest. The area was then prepped and draped in the normal sterile fashion. 1% Lidocaine was used for local anesthesia. Under ultrasound guidance a 8 Fr  Safe-T-Centesis catheter was introduced. Thoracentesis was performed. The catheter was removed and a dressing applied. FINDINGS: A total of approximately 570 cc of thin amber fluid was removed. Samples were sent to the laboratory as requested by the clinical team. IMPRESSION: Status post ultrasound-guided left-sided thoracentesis. Signed, Yvone Neu. Reyne Dumas, RPVI [facility]  [facility] Electronically Signed   By: Gilmer Mor D.O.   On: 10/02/2019 11:36    Cardiac Studies:  Assessment/Plan:  Status post atypical chest pain. Minimally elevated high sensitivity troponin due to demand ischemia doubt significant MI Acute diastolic left heart failure, compensated Left lower lobe pneumonia Bilateral pleural effusions S/P left sided thoracentesis Type 2 DM with hyperglycemia S/P stroke Senile dementia Bilateral hearing loss Asthma Generalized weakness  Plan Continue present management  Increase ambulation as tolerated DC IV Lasix changed to p.o. as per orders I will sign off please call if needed  LOS: 7 days    Rinaldo Cloud 10/04/2019, 9:25 AM

## 2019-10-04 NOTE — Progress Notes (Signed)
Family and MD at bedside . Plan of care discussed with family and verbalized understanding. Encourage pt to eat however pt has been drowsy  this morning. Will continue to monitor pt and assist with care during the shift , while encouraging an   Increase in PO nutrition

## 2019-10-04 NOTE — Evaluation (Addendum)
Occupational Therapy Evaluation Patient Details Name: [name] MRN: [id number] DOB: Jan 12, 1923 Today's Date: 10/04/2019    History of Present Illness 86 yo female admitted with Pna, weakness, sepsis. Hx of dementia, CVA, Dm, asthma, incontinence   Clinical Impression   Pt admitted with PNA. Pt currently with functional limitations due to the deficits listed below (see OT Problem List).  Pt will benefit from skilled OT to increase their safety and independence with ADL and functional mobility for ADL to facilitate discharge to venue listed below.    Pt able to get OOB, to [facility] and to chair. Pt overall min- mod A.  IF ALF can provide min- mod A then DC back to ALF appropriate    Follow Up Recommendations  SNF;Home health OT(HH at ALF)    Equipment Recommendations  3 in 1 bedside commode       Precautions / Restrictions Precautions Precautions: Fall Precaution Comments: monitor O2 Restrictions Weight Bearing Restrictions: No      Mobility Bed Mobility Overal bed mobility: Needs Assistance Bed Mobility: Supine to Sit     Supine to sit: Min assist;HOB elevated     General bed mobility comments: oob in recliner  Transfers Overall transfer level: Needs assistance Equipment used: 1 person hand held assist Transfers: Sit to/from [facility] Sit to Stand: Min assist Stand pivot transfers: Min assist       General transfer comment: Assist to rise, stabilize, control descent. sit to stand x 2 trials, pt stood ~60 seconds x 2 with RW, standing tolerance limited by dizziness and fatigue. VCs to lift head in standing. BP sitting 124/58, standing 114/57; SaO2 88% on room air at rest, 94% on 1L at rest.    Balance Overall balance assessment: Needs assistance   Sitting balance-Leahy Scale: Fair     Standing balance support: Bilateral upper extremity supported Standing balance-Leahy Scale: Poor                             ADL either  performed or assessed with clinical judgement   ADL Overall ADL's : Needs assistance/impaired                         Toilet Transfer: Moderate assistance;Cueing for safety;Cueing for sequencing;RW;Stand-pivot;BSC   Toileting- Clothing Manipulation and Hygiene: Maximal assistance;Sit to/from stand;Cueing for safety;Cueing for compensatory techniques;Cueing for sequencing         General ADL Comments: pt woke upon sitting up.  Pt needed to get to [facility] in which she had a BM then agreed to get to recliner.  Pt followed all commands and was appropriate     Vision Patient Visual Report: No change from baseline              Pertinent Vitals/Pain Pain Assessment: No/denies pain        Extremity/Trunk Assessment         Cervical / Trunk Assessment Cervical / Trunk Assessment: Kyphotic   Communication Communication Communication: Prefers language other than English   Cognition Arousal/Alertness: Lethargic Behavior During Therapy: WFL for tasks assessed/performed Overall Cognitive Status: History of cognitive impairments - at baseline                                 General Comments: following all commands and speaks enough [place].   General Comments  Home Living Family/patient expects to be discharged to:: Assisted living       Home Access: Level entry     Home Layout: One level                          Prior Functioning/Environment Level of Independence: Needs assistance                 OT Problem List: Decreased strength;Decreased activity tolerance;Impaired balance (sitting and/or standing);Decreased safety awareness;Decreased knowledge of use of DME or AE;Decreased knowledge of precautions      OT Treatment/Interventions: Self-care/ADL training;Therapeutic activities;Patient/family education    OT Goals(Current goals can be found in the care plan section) Acute Rehab OT Goals Patient Stated Goal:  son would like pt to return to [facility] OT Goal Formulation: With patient Time For Goal Achievement: 10/11/19 Potential to Achieve Goals: Good  OT Frequency: Min 2X/week   Barriers to D/C:               AM-PAC OT "6 Clicks" Daily Activity     Outcome Measure Help from another person eating meals?: A Little Help from another person taking care of personal grooming?: A Little Help from another person toileting, which includes using toliet, bedpan, or urinal?: A Lot Help from another person bathing (including washing, rinsing, drying)?: A Lot Help from another person to put on and taking off regular upper body clothing?: A Lot Help from another person to put on and taking off regular lower body clothing?: Total 6 Click Score: 13   End of Session Nurse Communication: Mobility status  Activity Tolerance: Patient tolerated treatment well Patient left: in chair;with call bell/phone within reach;with nursing/sitter in room  OT Visit Diagnosis: Unsteadiness on feet (R26.81);Other abnormalities of gait and mobility (R26.89);Muscle weakness (generalized) (M62.81);History of falling (Z91.81)                Time: 1204-1229 OT Time Calculation (min): 25 min Charges:  OT General Charges $OT Visit: 1 Visit OT Evaluation $OT Eval Moderate Complexity: 1 Mod OT Treatments $Self Care/Home Management : 8-22 mins  Lise Auer, OT Acute Rehabilitation Services [phone] Office- [phone]     [name], [name] 10/04/2019, 2:17 PM

## 2019-10-04 NOTE — Care Management Important Message (Signed)
Important Message  Patient Details IM Letter given to Lanier Clam RN Case Manager to present to the Patient Name: [name] MRN: [id number] Date of Birth: 1923/08/09   Medicare Important Message Given:  Yes     Caren Macadam 10/04/2019, 12:22 PM

## 2019-10-04 NOTE — Progress Notes (Signed)
Pharmacy Brief Note:  Called [facility] assisted living facility at the request of Dr. Margo Aye to inquire about PTA lexapro. Facility confirmed that patient currently takes escitalopram 5 mg PO daily and pt has been taking since June 2019.  Cindi Carbon, PharmD 10/04/19 12:57 PM

## 2019-10-04 NOTE — TOC Progression Note (Signed)
Transition of Care [facility]) - Progression Note    Patient Details  Name: [name] MRN: [id number] Date of Birth: 1923/06/17  Transition of Care [facility]) CM/SW Contact  Helyne Genther, Olegario Messier, RN Phone Number: 10/04/2019, 12:33 PM  Clinical Narrative:  Noted PT current progress note & recc. TC son Sidd informed of his d/c plan-to return back to ALF-[facility](patient's spouse is also a resident @ [facility])-CM contacted rep [facility]-faxed w/confirmation PT/ST current notes-they will assess & determine if able to return-await outcome.     Expected Discharge Plan: Home w Home Health Services Barriers to Discharge: Continued Medical Work up  Expected Discharge Plan and Services Expected Discharge Plan: Home w Home Health Services   Discharge Planning Services: CM Consult   Living arrangements for the past 2 months: Assisted Living Facility                           HH Arranged: PT           Social Determinants of Health (SDOH) Interventions    Readmission Risk Interventions No flowsheet data found.

## 2019-10-05 DIAGNOSIS — R05 Cough: Secondary | ICD-10-CM

## 2019-10-05 LAB — BASIC METABOLIC PANEL
Anion gap: 8 (ref 5–15)
BUN: 22 mg/dL (ref 8–23)
CO2: 29 mmol/L (ref 22–32)
Calcium: 8.4 mg/dL — ABNORMAL LOW (ref 8.9–10.3)
Chloride: 99 mmol/L (ref 98–111)
Creatinine, Ser: 0.61 mg/dL (ref 0.44–1.00)
GFR calc Af Amer: >60 mL/min (ref >60)
GFR calc non Af Amer: >60 mL/min (ref >60)
Glucose, Bld: 109 mg/dL — ABNORMAL HIGH (ref 70–99)
Potassium: 4.5 mmol/L (ref 3.5–5.1)
Sodium: 136 mmol/L (ref 135–145)

## 2019-10-05 LAB — CBC
HCT: 34.8 % — ABNORMAL LOW (ref 36.0–46.0)
Hemoglobin: 11 g/dL — ABNORMAL LOW (ref 12.0–15.0)
MCH: 28.9 pg (ref 26.0–34.0)
MCHC: 31.6 g/dL (ref 30.0–36.0)
MCV: 91.6 fL (ref 80.0–100.0)
Platelets: 266 10*3/uL (ref 150–400)
RBC: 3.8 MIL/uL — ABNORMAL LOW (ref 3.87–5.11)
RDW: 14.5 % (ref 11.5–15.5)
WBC: 7.2 10*3/uL (ref 4.0–10.5)
nRBC: 0 % (ref 0.0–0.2)

## 2019-10-05 LAB — GLUCOSE, CAPILLARY
Glucose-Capillary: 113 mg/dL — ABNORMAL HIGH (ref 70–99)
Glucose-Capillary: 263 mg/dL — ABNORMAL HIGH (ref 70–99)

## 2019-10-05 LAB — PH, BODY FLUID: pH, Body Fluid: 7.5

## 2019-10-05 MED ORDER — CEFDINIR 300 MG PO CAPS: 300.0000 mg | ORAL_CAPSULE | Freq: Two times a day (BID) | ORAL | 0 refills | Status: AC

## 2019-10-05 NOTE — TOC Transition Note (Signed)
Transition of Care [facility]) - CM/SW Discharge Note   Patient Details  Name: [name] MRN: [id number] Date of Birth: [birth year]  Transition of Care [facility]) CM/SW Contact:  Lanier Clam, RN Phone Number: 10/05/2019, 1:38 PM   Clinical Narrative: f/2,d/c summary faxed to [facility] rep [facility]/Imane aware & able to receive patient.[facility],Zach dme-3n1 to be delivered to rm prior d/c. Family will transport back to [facility]-they will provide their own private duty asst. No further CM needs.      Final next level of care: Assisted Living Barriers to Discharge: No Barriers Identified   Patient Goals and CMS Choice Patient states their goals for this hospitalization and ongoing recovery are:: return back [facility] ALF CMS Medicare.gov Compare Post Acute Care list provided to:: Patient Represenative (must comment)    Discharge Placement                       Discharge Plan and Services   Discharge Planning Services: CM Consult            DME Arranged: 3-N-1 DME Agency: [employer] Date DME Agency Contacted: 10/05/19 Time DME Agency Contacted: [phone] Representative spoke with at DME Agency: Ian Malkin HH Arranged: RN, PT, OT, Speech Therapy, Social Work [employer] Agency: [facility] [employer] Date HH Agency Contacted: 10/04/19 Time HH Agency Contacted: [phone] Representative spoke with at [facility] Agency: Kathlene November  Social Determinants of Health (SDOH) Interventions     Readmission Risk Interventions No flowsheet data found.

## 2019-10-05 NOTE — Plan of Care (Signed)
Patient was discharged back to her assisted living facility today. Patients son, Charmayne Sheer is taking her to the facility, all questions were answered and patient was taken to main entrance via wheelchair.

## 2019-10-05 NOTE — NC FL2 (Addendum)
[place] MEDICAID FL2 LEVEL OF CARE SCREENING TOOL     IDENTIFICATION  Patient Name: [name] Birthdate: November 27, 1922 Sex: female Admission Date (Current Location): 09/27/2019  [facility] and [place] Number:  Producer, television/film/video and Address:  [facility],  [address] [place], [place] [address]      Provider Number: [id number]  Attending Physician Name and Address:  Jerald Kief, MD  Relative Name and Phone Number:  Sidd Iglehart [phone]    Current Level of Care: Hospital Recommended Level of Care: Assisted Living Facility Prior Approval Number:    Date Approved/Denied:   PASRR Number:    Discharge Plan: Other (Comment)([facility])    Current Diagnoses: Patient Active Problem List   Diagnosis Date Noted  . Bilateral pleural effusion 10/03/2019  . Acute respiratory failure with hypoxemia (HCC) 10/03/2019  . Severe sepsis (HCC) 09/28/2019  . Fever 09/28/2019  . Generalized weakness 09/28/2019  . Dehydration 09/28/2019  . LLL pneumonia 09/27/2019  . Mild cognitive impairment with memory loss 06/08/2014  . SORE THROAT 04/10/2010  . Dyspnea 10/25/2009  . Cough 10/25/2009  . HYPERLIPIDEMIA 10/24/2009  . DEPRESSION 10/24/2009  . Essential hypertension 10/24/2009  . CAD 10/24/2009  . ALLERGIC RHINITIS, SEASONAL 10/24/2009  . Asthma 10/24/2009  . OSTEOPENIA 10/24/2009    Orientation RESPIRATION BLADDER Height & Weight     Self  Normal Continent Weight: 54.9 kg Height:  5\' 3"  (160 cm)  BEHAVIORAL SYMPTOMS/MOOD NEUROLOGICAL BOWEL NUTRITION STATUS      Continent Diet(regular;thin liquids.)  AMBULATORY STATUS COMMUNICATION OF NEEDS Skin   Limited Assist Verbally(little Engish understanding) Normal                       Personal Care Assistance Level of Assistance  Bathing, Feeding, Dressing Bathing Assistance: Limited assistance Feeding assistance: Limited assistance Dressing Assistance: Limited assistance     Functional  Limitations Info  Sight, Hearing, Speech Sight Info: Adequate Hearing Info: Adequate Speech Info: Adequate    SPECIAL CARE FACTORS FREQUENCY  Speech therapy(HH nursing-assessment;disease management)     PT Frequency: 5x week OT Frequency: 5x week     Speech Therapy Frequency: 1x week      Contractures Contractures Info: Not present    Additional Factors Info  Code Status, Allergies Code Status Info: Full code Allergies Info: Sulfonamide Derivatives           Current Medications (10/05/2019):  This is the current hospital active medication list Current Facility-Administered Medications  Medication Dose Route Frequency Provider Last Rate Last Admin  . acetaminophen (TYLENOL) tablet 650 mg  650 mg Oral Q6H PRN Howerter, Justin B, DO   650 mg at 10/03/19 1301   Or  . acetaminophen (TYLENOL) suppository 650 mg  650 mg Rectal Q6H PRN Howerter, Justin B, DO      . albuterol (PROVENTIL) (2.5 MG/3ML) 0.083% nebulizer solution 2.5 mg  2.5 mg Inhalation Q4H PRN Howerter, Justin B, DO   2.5 mg at 10/01/19 0528  . amLODipine (NORVASC) tablet 5 mg  5 mg Oral Daily 11/29/19, MD   5 mg at 10/05/19 0945   And  . irbesartan (AVAPRO) tablet 300 mg  300 mg Oral Daily 12/03/19, MD   300 mg at 10/05/19 0946  . aspirin chewable tablet 81 mg  81 mg Oral Daily Howerter, Justin B, DO   81 mg at 10/05/19 0946  . cefdinir (OMNICEF) capsule 300 mg  300 mg Oral  Q12H Irene Pap N, DO   300 mg at 10/05/19 0945  . clopidogrel (PLAVIX) tablet 75 mg  75 mg Oral Q breakfast Howerter, Justin B, DO   75 mg at 10/05/19 0803  . dextromethorphan-guaiFENesin (MUCINEX DM) 30-600 MG per 12 hr tablet 1 tablet  1 tablet Oral BID Lovey Newcomer T, NP   1 tablet at 10/05/19 0945  . diclofenac Sodium (VOLTAREN) 1 % topical gel 2 g  2 g Topical QID Irene Pap N, DO   2 g at 10/04/19 1457  . docusate sodium (COLACE) capsule 100 mg  100 mg Oral Daily Howerter, Justin B, DO   100 mg at 10/05/19 0946  .  donepezil (ARICEPT) tablet 10 mg  10 mg Oral QHS Howerter, Justin B, DO   10 mg at 10/04/19 2207  . enoxaparin (LOVENOX) injection 40 mg  40 mg Subcutaneous Q24H Irene Pap N, DO   40 mg at 10/04/19 1809  . escitalopram (LEXAPRO) tablet 5 mg  5 mg Oral Daily Irene Pap N, DO   5 mg at 10/05/19 0945  . feeding supplement (PRO-STAT SUGAR FREE 64) liquid 30 mL  30 mL Oral TID BM Hall, Carole N, DO   30 mL at 10/05/19 0946  . guaiFENesin (ROBITUSSIN) 100 MG/5ML solution 200 mg  10 mL Oral Q6H PRN Olalere, Adewale A, MD      . insulin aspart (novoLOG) injection 0-9 Units  0-9 Units Subcutaneous TID WC Howerter, Justin B, DO   5 Units at 10/05/19 1206  . ipratropium (ATROVENT) nebulizer solution 0.5 mg  0.5 mg Nebulization TID Irene Pap N, DO   0.5 mg at 10/05/19 0830  . ketorolac (ACULAR) 0.5 % ophthalmic solution 1 drop  1 drop Both Eyes Q2000 Blount, Xenia T, NP   1 drop at 10/04/19 2208  . levalbuterol (XOPENEX) nebulizer solution 0.63 mg  0.63 mg Nebulization TID Irene Pap N, DO   0.63 mg at 10/05/19 0830  . metoprolol succinate (TOPROL-XL) 24 hr tablet 25 mg  25 mg Oral Daily Irene Pap N, DO   25 mg at 10/05/19 0946  . mometasone-formoterol (DULERA) 100-5 MCG/ACT inhaler 2 puff  2 puff Inhalation BID Howerter, Justin B, DO   2 puff at 10/05/19 0804  . multivitamin with minerals tablet 1 tablet  1 tablet Oral Daily Irene Pap N, DO   1 tablet at 10/05/19 0945  . naloxone [facility]) injection 0.4 mg  0.4 mg Intravenous PRN Irene Pap N, DO   0.4 mg at 10/04/19 1214  . pantoprazole (PROTONIX) EC tablet 40 mg  40 mg Oral BID AC Tanda Rockers, MD   40 mg at 10/05/19 0803  . protein supplement (RESOURCE BENEPROTEIN) powder packet 6 g  1 Scoop Oral TID WC Hall, Carole N, DO   6 g at 10/05/19 1206  . rosuvastatin (CRESTOR) tablet 5 mg  5 mg Oral q1800 Howerter, Justin B, DO   5 mg at 10/04/19 1809  . sodium chloride flush (NS) 0.9 % injection 3 mL  3 mL Intravenous Q12H Howerter, Justin B,  DO   3 mL at 10/05/19 0948     Discharge Medications: Allergies as of 10/05/2019      Reactions   Tussionex Pennkinetic Er [hydrocod Polst-cpm Polst Er]    Lethargy- Had to reverse hydrocodone with Narcan.   Sulfonamide Derivatives Other (See Comments)   Nervous, agitation               Medication List  TAKE these medications       AeroChamber MV inhaler Use as instructed   amLODipine-olmesartan 5-40 MG tablet Commonly known as: AZOR Take 1 tablet by mouth daily.   aspirin 81 MG chewable tablet Chew 81 mg by mouth daily.   cefdinir 300 MG capsule Commonly known as: OMNICEF Take 1 capsule (300 mg total) by mouth every 12 (twelve) hours for 2 days.   Cholecalciferol 25 MCG (1000 UT) capsule Take 1,000 Units by mouth daily.   clopidogrel 75 MG tablet Commonly known as: PLAVIX TAKE 1 TABLET BY MOUTH DAILY   docusate sodium 100 MG capsule Commonly known as: COLACE Take 100 mg by mouth daily.   donepezil 5 MG tablet Commonly known as: ARICEPT Take 10 mg by mouth at bedtime.   escitalopram 5 MG tablet Commonly known as: LEXAPRO Take 5 mg by mouth daily.   Flovent Diskus 250 MCG/BLIST Aepb Generic drug: Fluticasone Propionate (Inhal) Inhale 1 puff into the lungs 2 (two) times daily.   GNP Earwax Removal Drops 6.5 % OTIC solution Generic drug: carbamide peroxide Place 5 drops into both ears 2 (two) times daily as needed (ear wax buildup).   Iron 325 (65 Fe) MG Tabs Take 650 mg by mouth daily.   Konsyl Daily Fiber 28.3 % Pack Generic drug: Psyllium Take 1 each by mouth daily.   loratadine 10 MG tablet Commonly known as: CLARITIN Take 10 mg by mouth daily as needed for allergies.   metoprolol succinate 25 MG 24 hr tablet Commonly known as: TOPROL-XL Take 25 mg by mouth daily.   mometasone-formoterol 100-5 MCG/ACT Aero Commonly known as: DULERA Inhale 2 puffs into the lungs 2 (two) times daily.   multivitamin  tablet Take 1 tablet by mouth daily.   Myrbetriq 50 MG Tb24 tablet Generic drug: mirabegron ER Take 50 mg by mouth daily.   ProAir HFA 108 (90 Base) MCG/ACT inhaler Generic drug: albuterol Inhale 2 puffs into the lungs every 4 (four) hours as needed for wheezing.   rosuvastatin 5 MG tablet Commonly known as: CRESTOR Take 5 mg by mouth daily.                           Relevant Imaging Results:  Relevant Lab Results:   Additional Information ss#[id number] [place], [place]

## 2019-10-05 NOTE — Progress Notes (Signed)
NAME:  [name], MRN:  [id number], DOB:  1923/04/22, LOS: 8 ADMISSION DATE:  09/27/2019, CONSULTATION DATE:  10/01/2019 REFERRING MD:  Dr Margo Aye, CHIEF COMPLAINT: Pneumonia, pleural effusion  Brief History   86 year old lady presented with complaints of fever -temperature 101 rx as CAP  History of nonproductive cough Has bilateral pleural effusions No chills or rigors, She does have a baseline of dementia, type 2 diabetes, ischemic CVA, hypertension, urinary incontinence, history of asthma Resides in a skilled nursing facility   Past Medical History   Past Medical History:  Diagnosis Date  . Diabetes (HCC)   . Hearing loss   . Thalamic infarction (HCC)      Significant Hospital Events   Thoracentesis 2/7 see below  Consults:  PCCM 2/6 IR 2/7  Procedures:  Thoracentesis 2/7 see below  Significant Diagnostic Tests:  Echo 2/6 :  Grade 1 diastolic dysfunction  L Thoracentesis 2/7 x 570 cc  Alb 1.3  LDH 289  Cell count 344 with 32% Polys cyt >>> reactive mesothelial cells only     Micro Data:  BC x 2  2/2  Neg  UC  2/2  Neg Urine Strep  2/2 Neg  Resp viral PCR  2/2  Neg for covid/influenza  PCT  2/3 = 4.11 PCT  2/5 = 2.10  PCT 2/8  =  0.40  Covid 19  PCR  2/9 neg   Antimicrobials:  Azithromycin >> 2/6  Rocephin>> 2/7  Omnicef  2/8 >>>  Interim history/subjective:  Overall feeling much better, no sob off 02/swallowing ok per son who declined further swallowing studies    Objective   Blood pressure (!) 129/57, pulse 83, temperature 99.3 F (37.4 C), temperature source Oral, resp. rate 18, height 5\' 3"  (1.6 m), weight 54.9 kg, SpO2 90 %.        Intake/Output Summary (Last 24 hours) at 10/05/2019 0948 Last data filed at 10/05/2019 0930 Gross per 24 hour  Intake 373 ml  Output 450 ml  Net -77 ml   Filed Weights   10/01/19 0457 10/02/19 0500 10/05/19 0500  Weight: 62.6 kg 55.7 kg 54.9 kg    Examination: Sitting in chair RA sats low 90's Pt alert   Cc "tired" not much verbal communication/ harsh dry cough on voluntary maneuver No jvd Oropharynx clear,  mucosa nl Neck supple Lungs with minimal crackles L base with dullness at base only RRR no s3 or or sign murmur Abd obese with nl  excursion  Extr warm with no edema or clubbing noted Neuro   no apparent motor deficits   CXR PA and Lateral:   10/03/19   I personally reviewed images and agree with radiology impression as follows:   Small effusions similar to that noted on the prior exam.  Mild vascular congestion.      Resolved Hospital Problem list     Assessment & Plan:  Multilobar pneumonia Sepsis secondary to pneumonia - see micro/abx flow sheet above / PCT now normalized and on po abx    Hypoxemic respiratory failure History of asthma -- resolved     Bilateral pleural effusion L > R  Probably transudative - s/p L  Thoracentesis 2/7 x 570 cc  Alb 1.3  LDH 289  Cell count 344 with 32% Polys cyt neg >  F/u prn   Excessive upper airway coughing fits  - rx ppi/ hard rock candies but no cough drops/mints or methanol products   Son MD at bedside, updated  Sees [facility] as outpt, offered to see if he is not available in future.  Pulmonary f/u is prn     Christinia Gully, MD Pulmonary and [place] [phone] After 5:30 PM or weekends, use Beeper [phone]

## 2019-10-05 NOTE — Discharge Summary (Signed)
Physician Discharge Summary  [name] UJW:[id number] DOB: 01-May-1923 DOA: 09/27/2019  PCP: Chilton Greathouse, MD  Admit date: 09/27/2019 Discharge date: 10/05/2019  Admitted From: Home Disposition:  Home  Recommendations for Outpatient Follow-up:  1. Follow up with PCP in 1-2 weeks 2. Follow up with Pulmonary as scheduled  Home Health:PT/OT   Discharge Condition:Stable CODE STATUS:Full Diet recommendation: Full   Brief/Interim Summary: 86 y.o.femalewith medical history significant fordementia, type 2 diabetes mellitus, ischemic CVA, hypertension, urinary incontinence, asthma,who is admitted to [facility] on 2/2/2021with severe sepsis due to left lower lobe pneumoniaafter presenting from ALFfor evaluation of fever.  Hypoxic on presentation.  Responded well to empiric IV antibiotics.  CTA negative for PE.  Hospital course complicated by pulmonary edema and bilateral pleural effusion in the setting of IV fluid hydration.  Improved with diuresing.  2D echo showed LVEF 70-75% with grade I DD.  Post left thoracentesis by IR Dr. Loreta Ave on 10/02/19 with 570 cc of amber fluid removed.  PCCM following.  Abnormal 12 lead EKG with mildly elevated troponin.  Cardiology consulted on 10/02/19.  Appreciate specialists assistance.  Drowsy on 2/8 after taking Tussionex for cough associated pleuritic pain.  The hydrocodone in the solution was reversed with Narcan 0.4 mg x 2 with improvement.  Tussionex was added to her list of allergies.  Discharge Diagnoses:  Principal Problem:   LLL pneumonia Active Problems:   Essential hypertension   Cough   Severe sepsis (HCC)   Fever   Generalized weakness   Dehydration   Bilateral pleural effusion   Acute respiratory failure with hypoxemia (HCC)  Resolved sepsis 2/2 LLL CAP Presented with leukocytosis, tachycardia, chest x-ray with findings suggestive of left lower lobe pneumonia.   Completed 6 days of IV abx empirically IV azithromycin and  Rocephin. Switched to po abx cefdinir 300 mg BID x 2 more days after discharge Procalcitonin and lactic acid normalized on 10/03/2019. Afebrile with no leukocytosis Nontoxic-appearing  Home O2 evaluation prior to dc Encourage use of incentive spirometer and flutter valve. Blood cultures and urine culture negative final.   Mobilize as tolerated  Acute hypoxic respiratory failure secondary to left lower lobe community-acquired pneumonia complicated by pulmonary edema and bilateral pleural effusions left greater than right post diuresing and left thoracentesis 2/7 by IR. Not on O2 supplementation at baseline Ultrasound-guided left thoracentesis done by interventional radiology Dr. Loreta Ave with 570 cc of amber fluid removed.  FINAL MICROSCOPIC DIAGNOSIS:  - Reactive mesothelial cells present. Continue bronchodilators Continue incentive spirometer and flutter valve. PCCM had been following.  Bilateral pleural effusion left greater than right likely in the setting of pneumonia superimposed by volume overload, iatrogenic from IV fluid hydration Post ultrasound-guided left thoracentesis Management as stated above Responded well to diuresis with good urine output IV Lasix dced  Acute pulmonary edema in the setting of volume overload Continue to closely monitor volume status No evidence of renal failure  Resolved Hyperkalemia Resolved post diuresing. Continue to monitor electrolytes and adjust as needed  Pleuritic pain in the setting of intractable cough Received Tussionex which was reversed with Narcan 0.4 mg x 2 doses Added Tussionex in her list of allergies Avoid opiates  Elevated troponin likely demand ischemia in the setting of hypoxia Denies any anginal symptoms Troponin peaked at 125 and trended down to 42. No sign of acute ischemia on twelve-lead EKG.    Chronic diastolic CHF 2D echo done on 10/01/2019 showed LVEF 70 to 75% with grade 1 diastolic dysfunction Continue  to  monitor volume status Continue strict I's and O's and daily weights Cardiology had been following  Moderate protein calorie malnutrition Albumin 3.6 BMI 21 Loss of muscle mass Dietitian consulted for nutritional assistance Continued Glucerna/oral supplement  Remote history of dysphagia Speech consulted to assess Recommendation per speech therapy Aspirations precautions  Elevated D-dimer in the setting of hypoxia CTA negative for PE. Bilateral lower extremity Doppler ultrasound negative for DVT.  History of asthma Continue home medications Continue to maintain O2 saturation greater than 92%.  Type 2 DM A1C 7.2 on 09/28/2019. Continue ISS while in hospital  Resolved post repletion: Hypomagnesemia  Mag 1.5>> 2.4>>1.7>> 2.3  Hx ischemic CVA Continued ASA, plavix and crestor  Essential HTN BP is at goal Continue home medications IV hydralazine 2 mg every 6 hours as needed for SBP greater than 160 Avoid hypotension  Physical debility/Generalized weakness Continue PT OT with assistance and fall precautions. Out of bed to chair as tolerated with assistance and fall precautions.  Chronic anxiety/depression Discussed with her PCP Dr. Felipa Eth via phone on 2/9.  Ms. Stromberg has been on Lexapro for about 5 years. Dr. Felipa Eth will address it at her next follow up appointment.   Discharge Instructions   Allergies as of 10/05/2019      Reactions   Tussionex Pennkinetic Er [hydrocod Polst-cpm Polst Er]    Lethargy- Had to reverse hydrocodone with Narcan.   Sulfonamide Derivatives Other (See Comments)   Nervous, agitation      Medication List    TAKE these medications   AeroChamber MV inhaler Use as instructed   amLODipine-olmesartan 5-40 MG tablet Commonly known as: AZOR Take 1 tablet by mouth daily.   aspirin 81 MG chewable tablet Chew 81 mg by mouth daily.   cefdinir 300 MG capsule Commonly known as: OMNICEF Take 1 capsule (300 mg total) by mouth every 12  (twelve) hours for 2 days.   Cholecalciferol 25 MCG (1000 UT) capsule Take 1,000 Units by mouth daily.   clopidogrel 75 MG tablet Commonly known as: PLAVIX TAKE 1 TABLET BY MOUTH DAILY   docusate sodium 100 MG capsule Commonly known as: COLACE Take 100 mg by mouth daily.   donepezil 5 MG tablet Commonly known as: ARICEPT Take 10 mg by mouth at bedtime.   escitalopram 5 MG tablet Commonly known as: LEXAPRO Take 5 mg by mouth daily.   Flovent Diskus 250 MCG/BLIST Aepb Generic drug: Fluticasone Propionate (Inhal) Inhale 1 puff into the lungs 2 (two) times daily.   GNP Earwax Removal Drops 6.5 % OTIC solution Generic drug: carbamide peroxide Place 5 drops into both ears 2 (two) times daily as needed (ear wax buildup).   Iron 325 (65 Fe) MG Tabs Take 650 mg by mouth daily.   Konsyl Daily Fiber 28.3 % Pack Generic drug: Psyllium Take 1 each by mouth daily.   loratadine 10 MG tablet Commonly known as: CLARITIN Take 10 mg by mouth daily as needed for allergies.   metoprolol succinate 25 MG 24 hr tablet Commonly known as: TOPROL-XL Take 25 mg by mouth daily.   mometasone-formoterol 100-5 MCG/ACT Aero Commonly known as: DULERA Inhale 2 puffs into the lungs 2 (two) times daily.   multivitamin tablet Take 1 tablet by mouth daily.   Myrbetriq 50 MG Tb24 tablet Generic drug: mirabegron ER Take 50 mg by mouth daily.   ProAir HFA 108 (90 Base) MCG/ACT inhaler Generic drug: albuterol Inhale 2 puffs into the lungs every 4 (four) hours as needed for  wheezing.   rosuvastatin 5 MG tablet Commonly known as: CRESTOR Take 5 mg by mouth daily.            Durable Medical Equipment  (From admission, onward)         Start     Ordered   10/04/19 1429  For home use only DME 3 n 1  Once     10/04/19 1428         Follow-up Information    Avva, Ravisankar, MD. Schedule an appointment as soon as possible for a visit in 2 week(s).   Specialty: Internal Medicine Contact  information: [address] [place] [address] [phone]          Allergies  Allergen Reactions  . Tussionex Pennkinetic Er [Hydrocod Polst-Cpm Polst Er]     Lethargy- Had to reverse hydrocodone with Narcan.  . Sulfonamide Derivatives Other (See Comments)    Nervous, agitation    Consultations: PCCM Cardiology   Procedures/Studies: DG Chest 2 View  Result Date: 10/03/2019 CLINICAL DATA:  Status post thoracentesis EXAM: CHEST - 2 VIEW COMPARISON:  10/02/2019 FINDINGS: Cardiac shadow is mildly enlarged but stable. Aortic calcifications are seen. Small pleural effusions are noted similar to that seen on the prior exam. Mild vascular congestion is noted. IMPRESSION: Small effusions similar to that noted on the prior exam. Mild vascular congestion. Electronically Signed   By: Alcide Clever M.D.   On: 10/03/2019 12:06   CT ANGIO CHEST PE W OR WO CONTRAST  Result Date: 09/30/2019 CLINICAL DATA:  86 year old female with hypoxemia. EXAM: CT ANGIOGRAPHY CHEST WITH CONTRAST TECHNIQUE: Multidetector CT imaging of the chest was performed using the standard protocol during bolus administration of intravenous contrast. Multiplanar CT image reconstructions and MIPs were obtained to evaluate the vascular anatomy. CONTRAST:  19mL OMNIPAQUE IOHEXOL 350 MG/ML SOLN COMPARISON:  Chest radiograph dated 09/27/2019 FINDINGS: Cardiovascular: There is mild cardiomegaly. There is retrograde flow of contrast from the right atrium into the IVC in keeping with a degree of right heart dysfunction. No pericardial effusion. There is coronary vascular calcification. There is moderate atherosclerotic calcification of the thoracic aorta. No aneurysmal dilatation or evidence of dissection. Evaluation of the pulmonary arteries is somewhat limited due to respiratory motion artifact and suboptimal visualization of the peripheral branches. No central pulmonary artery embolus identified. Mediastinum/Nodes: No definite hilar  or mediastinal adenopathy. Evaluation however is very limited due to consolidative changes of the left lung and respiratory motion artifact. Lungs/Pleura: Moderate bilateral pleural effusions, left greater right. There is consolidative changes of the majority of the left lower lobe as well as partial consolidative changes of the right lower lobe. Multiple clusters of ground-glass opacity in the upper lobes bilaterally noted. Findings most consistent with multifocal pneumonia. Clinical correlation and follow-up to resolution recommended. There is no pneumothorax. There is tracheal malacia. The central airways remain patent. Upper Abdomen: No acute abnormality. Musculoskeletal: Degenerative changes of the spine. No acute osseous pathology. Review of the MIP images confirms the above findings. IMPRESSION: 1. No large central pulmonary artery embolus. 2. Moderate bilateral pleural effusions with consolidative changes of the majority of the left lower lobe and findings of multifocal pneumonia. Clinical correlation and follow-up to resolution recommended. 3. Coronary vascular calcification and Aortic Atherosclerosis (ICD10-I70.0). Electronically Signed   By: Elgie Collard M.D.   On: 09/30/2019 18:34   DG Chest Port 1 View  Result Date: 10/02/2019 CLINICAL DATA:  Status post left thoracentesis EXAM: PORTABLE CHEST 1 VIEW COMPARISON:  Earlier  same day FINDINGS: 10:31 a.m. lungs appear hyperexpanded. Interstitial markings are diffusely coarsened with chronic features. Bibasilar collapse/consolidative change noted with bilateral pleural effusions, left greater than right. Left pleural effusion has certainly decreased since the film earlier today and there is no evidence for pneumothorax status post thoracentesis. Bones are diffusely demineralized. Telemetry leads overlie the chest. IMPRESSION: Interval decrease in left pleural effusion status post thoracentesis. No evidence for pneumothorax. Electronically Signed   By:  Kennith Center M.D.   On: 10/02/2019 12:54   DG Chest Port 1 View  Result Date: 10/02/2019 CLINICAL DATA:  Pleural effusion EXAM: PORTABLE CHEST 1 VIEW COMPARISON:  10/01/2019 and prior exams FINDINGS: Cardiomediastinal silhouette is unchanged. Slightly improved lung volumes noted. Bilateral pleural effusions and bilateral airspace opacities, LEFT-greater-than-RIGHT are again noted. Little significant change from the prior study. IMPRESSION: Unchanged appearance of the chest with bilateral pleural effusions and airspace opacities. Electronically Signed   By: Harmon Pier M.D.   On: 10/02/2019 05:58   DG CHEST PORT 1 VIEW  Result Date: 10/01/2019 CLINICAL DATA:  Acute shortness of breath EXAM: PORTABLE CHEST 1 VIEW COMPARISON:  09/27/2019 chest radiograph. 09/30/2019 chest CT FINDINGS: Moderate to large LEFT pleural effusion and small to moderate RIGHT pleural effusion again noted. Possible increased since pulmonary vascular congestion noted. RIGHT LOWER lung and diffuse LEFT lung airspace disease noted. LEFT mid and LOWER lung atelectasis/consolidation again noted. No evidence of pneumothorax. No acute bony abnormality noted. IMPRESSION: Question increased pulmonary vascular congestion. Little significant change in bilateral pleural effusions, RIGHT LOWER lung and diffuse LEFT lung airspace disease and LEFT mid and LOWER lung atelectasis/consolidation. Electronically Signed   By: Harmon Pier M.D.   On: 10/01/2019 07:42   DG Chest Port 1 View  Result Date: 09/27/2019 CLINICAL DATA:  Sepsis. EXAM: PORTABLE CHEST 1 VIEW COMPARISON:  July 29, 2016. FINDINGS: Stable cardiomediastinal silhouette. Atherosclerosis of thoracic aorta is noted. No pneumothorax or pleural effusion is noted. Right lung is clear. Mild left basilar atelectasis or infiltrate is noted. Bony thorax is unremarkable. IMPRESSION: Aortic atherosclerosis. Mild left basilar atelectasis or infiltrate is noted. Aortic Atherosclerosis (ICD10-I70.0).  Electronically Signed   By: Lupita Raider M.D.   On: 09/27/2019 18:29   ECHOCARDIOGRAM COMPLETE  Result Date: 10/01/2019   ECHOCARDIOGRAM REPORT   Patient Name:   [name] Date of Exam: 10/01/2019 Medical Rec #:  [id number]        Height:       63.0 in Accession #:    [id number]       Weight:       138.0 lb Date of Birth:  08/19/1923        BSA:          1.65 m Patient Age:    89 years         BP:           153/70 mmHg Patient Gender: F                HR:           106 bpm. Exam Location:  Inpatient Procedure: 2D Echo Indications:    Elevated Troponin  History:        Patient has prior history of Echocardiogram examinations, most                 recent 09/27/2010. CAD; Risk Factors:Hypertension and                 Dyslipidemia.  Severe sepsis.  Sonographer:    Leeroy Bockhelsea Turrentine Referring Phys: [id number] Darlin DropAROLE N HALL  Sonographer Comments: Image acquisition challenging due to uncooperative patient and Image acquisition challenging due to respiratory motion. IMPRESSIONS  1. Left ventricular ejection fraction, by visual estimation, is 70 to 75%. The left ventricle has hyperdynamic function. There is no left ventricular hypertrophy.  2. Left ventricular diastolic parameters are consistent with Grade I diastolic dysfunction (impaired relaxation).  3. The left ventricle has no regional wall motion abnormalities.  4. Global right ventricle has normal systolic function.The right ventricular size is normal. No increase in right ventricular wall thickness.  5. Left atrial size was normal.  6. Right atrial size was normal.  7. Moderate mitral annular calcification.  8. The mitral valve is grossly normal. No evidence of mitral valve regurgitation.  9. The tricuspid valve is grossly normal. 10. The tricuspid valve is grossly normal. Tricuspid valve regurgitation is trivial. 11. The aortic valve was not well visualized. Aortic valve regurgitation is mild. 12. The pulmonic valve was not well visualized. Pulmonic valve  regurgitation is not visualized. 13. The inferior vena cava is normal in size with greater than 50% respiratory variability, suggesting right atrial pressure of 3 mmHg. 14. The interatrial septum was not well visualized. FINDINGS  Left Ventricle: Left ventricular ejection fraction, by visual estimation, is 70 to 75%. The left ventricle has hyperdynamic function. The left ventricle has no regional wall motion abnormalities. The left ventricular internal cavity size was the left ventricle is normal in size. There is no left ventricular hypertrophy. Left ventricular diastolic parameters are consistent with Grade I diastolic dysfunction (impaired relaxation). Right Ventricle: The right ventricular size is normal. No increase in right ventricular wall thickness. Global RV systolic function is has normal systolic function. Left Atrium: Left atrial size was normal in size. Right Atrium: Right atrial size was normal in size Pericardium: There is no evidence of pericardial effusion. Mitral Valve: The mitral valve is grossly normal. Moderate mitral annular calcification. No evidence of mitral valve regurgitation. Tricuspid Valve: The tricuspid valve is grossly normal. Tricuspid valve regurgitation is trivial. Aortic Valve: The aortic valve was not well visualized. Aortic valve regurgitation is mild. Aortic valve mean gradient measures 6.0 mmHg. Aortic valve peak gradient measures 10.1 mmHg. Pulmonic Valve: The pulmonic valve was not well visualized. Pulmonic valve regurgitation is not visualized. Pulmonic regurgitation is not visualized. Aorta: The aortic root is normal in size and structure. Venous: The inferior vena cava is normal in size with greater than 50% respiratory variability, suggesting right atrial pressure of 3 mmHg. IAS/Shunts: The interatrial septum was not well visualized.  LEFT VENTRICLE PLAX 2D LVIDd:         2.80 cm LVIDs:         1.60 cm LV PW:         0.80 cm LV IVS:        0.90 cm LVOT diam:     1.70 cm  LV SV:         22 ml LV SV Index:   13.34 LVOT Area:     2.27 cm  RIGHT VENTRICLE RV S prime:     14.30 cm/s TAPSE (M-mode): 1.5 cm LEFT ATRIUM           Index       RIGHT ATRIUM           Index LA diam:      3.40 cm 2.06 cm/m  RA Area:  14.20 cm LA Vol (A4C): 48.7 ml 29.49 ml/m RA Volume:   36.10 ml  21.86 ml/m  AORTIC VALVE AV Vmax:      159.00 cm/s AV Vmean:     120.000 cm/s AV VTI:       0.282 m AV Peak Grad: 10.1 mmHg AV Mean Grad: 6.0 mmHg  AORTA Ao Root diam: 2.50 cm  SHUNTS Systemic Diam: 1.70 cm  Prentice DockerSuresh Koneswaran MD Electronically signed by Prentice DockerSuresh Koneswaran MD Signature Date/Time: 10/01/2019/1:45:15 PM    Final    VAS US LOWER EXTREMITY VENOUS (DVT)  Result Date: 10/02/2019  Lower Venous DVTStudy Indications: Swelling.  Limitations: Body habitus and poor ultrasound/tissue interface. Comparison Study: No prior study Performing Technologist: Gertie FeyMichelle Simonetti MHA, RDMS, RVT, RDCS  Examination Guidelines: A complete evaluation includes B-mode imaging, spectral Doppler, color Doppler, and power Doppler as needed of all accessible portions of each vessel. Bilateral testing is considered an integral part of a complete examination. Limited examinations for reoccurring indications may be performed as noted. The reflux portion of the exam is performed with the patient in reverse Trendelenburg.  +---------+---------------+---------+-----------+----------+--------------+ RIGHT    CompressibilityPhasicitySpontaneityPropertiesThrombus Aging +---------+---------------+---------+-----------+----------+--------------+ CFV      Full           Yes      Yes                                 +---------+---------------+---------+-----------+----------+--------------+ SFJ      Full                                                        +---------+---------------+---------+-----------+----------+--------------+ FV Prox  Full                                                         +---------+---------------+---------+-----------+----------+--------------+ FV Mid   Full                                                        +---------+---------------+---------+-----------+----------+--------------+ FV DistalFull                                                        +---------+---------------+---------+-----------+----------+--------------+ PFV      Full                                                        +---------+---------------+---------+-----------+----------+--------------+ POP      Full           Yes      Yes                                 +---------+---------------+---------+-----------+----------+--------------+  PTV      Full                                                        +---------+---------------+---------+-----------+----------+--------------+ PERO     Full                                                        +---------+---------------+---------+-----------+----------+--------------+   +---------+---------------+---------+-----------+----------+--------------+ LEFT     CompressibilityPhasicitySpontaneityPropertiesThrombus Aging +---------+---------------+---------+-----------+----------+--------------+ CFV      Full           Yes      Yes                                 +---------+---------------+---------+-----------+----------+--------------+ SFJ      Full                                                        +---------+---------------+---------+-----------+----------+--------------+ FV Prox  Full                                                        +---------+---------------+---------+-----------+----------+--------------+ FV Mid   Full                                                        +---------+---------------+---------+-----------+----------+--------------+ FV DistalFull                                                         +---------+---------------+---------+-----------+----------+--------------+ PFV      Full                                                        +---------+---------------+---------+-----------+----------+--------------+ POP      Full           Yes      Yes                                 +---------+---------------+---------+-----------+----------+--------------+ PTV      Full                                                        +---------+---------------+---------+-----------+----------+--------------+  PERO     Full                                                        +---------+---------------+---------+-----------+----------+--------------+     Summary: RIGHT: - There is no evidence of deep vein thrombosis in the lower extremity.  - No cystic structure found in the popliteal fossa.  LEFT: - There is no evidence of deep vein thrombosis in the lower extremity.  - No cystic structure found in the popliteal fossa.  *See table(s) above for measurements and observations. Electronically signed by Fabienne Bruns MD on 10/02/2019 at 11:33:52 AM.    Final    US THORACENTESIS ASP PLEURAL SPACE W/IMG GUIDE  Result Date: 10/02/2019 INDICATION: 86 year old female with left-sided pleural effusion and pneumonia EXAM: ULTRASOUND GUIDED LEFT THORACENTESIS MEDICATIONS: None. COMPLICATIONS: None PROCEDURE: An ultrasound guided thoracentesis was thoroughly discussed with the patient and questions answered. The benefits, risks, alternatives and complications were also discussed. The patient understands and wishes to proceed with the procedure. Written consent was obtained. Ultrasound was performed to localize and mark an adequate pocket of fluid in the left chest. The area was then prepped and draped in the normal sterile fashion. 1% Lidocaine was used for local anesthesia. Under ultrasound guidance a 8 Fr Safe-T-Centesis catheter was introduced. Thoracentesis was performed. The catheter was removed  and a dressing applied. FINDINGS: A total of approximately 570 cc of thin amber fluid was removed. Samples were sent to the laboratory as requested by the clinical team. IMPRESSION: Status post ultrasound-guided left-sided thoracentesis. Signed, Yvone Neu. Reyne Dumas, RPVI [facility] Rad[facility] Electronically Signed   By: Gilmer Mor D.O.   On: 10/02/2019 11:36     Subjective: Eager to go home  Discharge Exam: Vitals:   10/05/19 0800 10/05/19 0830  BP:    Pulse:    Resp:    Temp:    SpO2: 94% 90%   Vitals:   10/04/19 2106 10/05/19 0500 10/05/19 0800 10/05/19 0830  BP: (!) 129/57     Pulse: 83     Resp: 18     Temp: 99.3 F (37.4 C)     TempSrc: Oral     SpO2: 93%  94% 90%  Weight:  54.9 kg    Height:        General: Pt is alert, awake, not in acute distress Cardiovascular: RRR, S1/S2 +, no rubs, no gallops Respiratory: CTA bilaterally, no wheezing, no rhonchi Abdominal: Soft, NT, ND, bowel sounds + Extremities: no edema, no cyanosis   The results of significant diagnostics from this hospitalization (including imaging, microbiology, ancillary and laboratory) are listed below for reference.     Microbiology: Recent Results (from the past 240 hour(s))  Blood Culture (routine x 2)     Status: None   Collection Time: 09/27/19  5:18 PM   Specimen: BLOOD  Result Value Ref Range Status   Specimen Description   Final    BLOOD LEFT ANTECUBITAL Performed at [facility], [address] [place], [place] [address]    Special Requests   Final    BOTTLES DRAWN AEROBIC AND ANAEROBIC Blood Culture results may not be optimal due to an excessive volume of blood received in culture bottles Performed at [facility] [address]  [place], [place] [address]    Culture   Final    NO GROWTH 5 DAYS Performed at [facility], [address] [place], [place] [address]    Report Status  10/02/2019 FINAL  Final  Respiratory Panel by RT PCR (Flu A&B, Covid) - Nasopharyngeal Swab     Status: None   Collection Time: 09/27/19  5:23 PM   Specimen: Nasopharyngeal Swab  Result Value Ref Range Status   SARS Coronavirus 2 by RT PCR NEGATIVE NEGATIVE Final    Comment: (NOTE) SARS-CoV-2 target nucleic acids are NOT DETECTED. The SARS-CoV-2 RNA is generally detectable in upper respiratoy specimens during the acute phase of infection. The lowest concentration of SARS-CoV-2 viral copies this assay can detect is 131 copies/mL. A negative result does not preclude SARS-Cov-2 infection and should not be used as the sole basis for treatment or other patient management decisions. A negative result may occur with  improper specimen collection/handling, submission of specimen other than nasopharyngeal swab, presence of viral mutation(s) within the areas targeted by this assay, and inadequate number of viral copies (<131 copies/mL). A negative result must be combined with clinical observations, patient history, and epidemiological information. The expected result is Negative. Fact Sheet for Patients:  https://www.moore.com/ Fact Sheet for Healthcare Providers:  https://www.young.biz/ This test is not yet ap proved or cleared by the [place] FDA and  has been authorized for detection and/or diagnosis of SARS-CoV-2 by FDA under an Emergency Use Authorization (EUA). This EUA will remain  in effect (meaning this test can be used) for the duration of the COVID-19 declaration under Section 564(b)(1) of the Act, 21 U.S.C. section 360bbb-3(b)(1), unless the authorization is terminated or revoked sooner.    Influenza A by PCR NEGATIVE NEGATIVE Final   Influenza B by PCR NEGATIVE NEGATIVE Final    Comment: (NOTE) The Xpert Xpress SARS-CoV-2/FLU/RSV assay is intended as an aid in  the diagnosis of influenza from Nasopharyngeal swab specimens and  should  not be used as a sole basis for treatment. Nasal washings and  aspirates are unacceptable for Xpert Xpress SARS-CoV-2/FLU/RSV  testing. Fact Sheet for Patients: https://www.moore.com/ Fact Sheet for Healthcare Providers: https://www.young.biz/ This test is not yet approved or cleared by the [place] FDA and  has been authorized for detection and/or diagnosis of SARS-CoV-2 by  FDA under an Emergency Use Authorization (EUA). This EUA will remain  in effect (meaning this test can be used) for the duration of the  Covid-19 declaration under Section 564(b)(1) of the Act, 21  U.S.C. section 360bbb-3(b)(1), unless the authorization is  terminated or revoked. Performed at [facility], [address] [place], [place] [address]   Blood Culture (routine x 2)     Status: None   Collection Time: 09/27/19  5:33 PM   Specimen: BLOOD  Result Value Ref Range Status   Specimen Description   Final    BLOOD RIGHT ANTECUBITAL Performed at [facility], [address] [place], [place] [address]    Special Requests   Final    BOTTLES DRAWN AEROBIC AND ANAEROBIC Blood Culture adequate volume Performed at [facility], [address] [place], [place] [address]    Culture   Final    NO GROWTH 5 DAYS Performed at [facility], [address] [place], [place] [address]    Report Status 10/02/2019 FINAL  Final  Urine culture     Status: None   Collection Time: 09/27/19  7:24 PM   Specimen: In/Out Cath Urine  Result Value Ref Range Status   Specimen Description   Final    IN/OUT CATH URINE Performed at [facility], [place] [address], [place], [place] [address]    Special Requests   Final    NONE Performed at [facility], [place] [address], [place], [place] [address]    Culture   Final    NO GROWTH Performed at [facility], [place] [address],  [place], [place] [address]    Report Status 09/28/2019 FINAL  Final  SARS CORONAVIRUS 2 (TAT 6-24 HRS) Nasopharyngeal Nasopharyngeal Swab     Status: None   Collection Time: 10/04/19 12:59 PM   Specimen: Nasopharyngeal Swab  Result Value Ref Range Status   SARS Coronavirus 2 NEGATIVE NEGATIVE Final    Comment: (NOTE) SARS-CoV-2 target nucleic acids are NOT DETECTED. The SARS-CoV-2 RNA is generally detectable in upper and lower respiratory specimens during the acute phase of infection. Negative results do not preclude SARS-CoV-2 infection, do not rule out co-infections with other pathogens, and should not be used as the sole basis for treatment or other patient management decisions. Negative results must be combined with clinical observations, patient history, and epidemiological information. The expected result is Negative. Fact Sheet for Patients: SugarRoll.be Fact Sheet for Healthcare Providers: https://www.woods-mathews.com/ This test is not yet approved or cleared by the [place] FDA and  has been authorized for detection and/or diagnosis of SARS-CoV-2 by FDA under an Emergency Use Authorization (EUA). This EUA will remain  in effect (meaning this test can be used) for the duration of the COVID-19 declaration under Section 56 4(b)(1) of the Act, 21 U.S.C. section 360bbb-3(b)(1), unless the authorization is terminated or revoked sooner. Performed at [facility], [place] [address] [place], [place] [address]      Labs: BNP (last 3 results) No results for input(s): BNP in the last 8760 hours. Basic Metabolic Panel: Recent Labs  Lab 09/29/19 0507 09/30/19 0409 10/01/19 0512 10/01/19 0512 10/02/19 0549 10/03/19 0458 10/03/19 1709 10/04/19 0507 10/05/19 0356  NA 138   < > 141  --  141 138  --  136 136  K 3.5   < > 3.4*   < > 3.9 4.8 4.7 4.4 4.5  CL 106   < > 102  --  102 100  --  98 99  CO2 26   < > 24  --  30 31  --  31 29   GLUCOSE 142*   < > 133*  --  143* 112*  --  128* 109*  BUN 22   < > 11  --  13 18  --  25* 22  CREATININE 0.53   < > 0.48  --  0.61 0.68  --  0.70 0.61  CALCIUM 8.2*   < > 7.9*  --  8.5* 8.3*  --  8.3* 8.4*  MG 2.4  --   --   --  1.9 1.7 2.3  --   --   PHOS 1.7*  --   --   --  2.6  --  4.4  --   --    < > = values in this interval not displayed.   Liver Function Tests: No results for input(s): AST, ALT, ALKPHOS, BILITOT, PROT, ALBUMIN in the last 168 hours. No results for input(s): LIPASE, AMYLASE in the last 168 hours. No results for input(s): AMMONIA in the last 168  hours. CBC: Recent Labs  Lab 09/29/19 0507 09/29/19 0507 09/30/19 0409 09/30/19 0409 10/01/19 0512 10/02/19 0549 10/03/19 0458 10/04/19 0507 10/05/19 0356  WBC 17.2*   < > 13.3*   < > 10.5 10.0 10.9* 8.0 7.2  NEUTROABS 14.8*  --  10.9*  --  8.0* 6.6 7.2  --   --   HGB 11.9*   < > 12.0   < > 12.5 12.9 11.2* 10.8* 11.0*  HCT 36.8   < > 37.2   < > 38.5 39.5 35.0* 34.4* 34.8*  MCV 90.9   < > 90.7   < > 90.0 89.0 90.2 91.0 91.6  PLT 238   < > 256   < > 270 297 262 264 266   < > = values in this interval not displayed.   Cardiac Enzymes: No results for input(s): CKTOTAL, CKMB, CKMBINDEX, TROPONINI in the last 168 hours. BNP: Invalid input(s): POCBNP CBG: Recent Labs  Lab 10/04/19 1117 10/04/19 1630 10/04/19 2108 10/05/19 0724 10/05/19 1137  GLUCAP 219* 194* 123* 113* 263*   D-Dimer No results for input(s): DDIMER in the last 72 hours. Hgb A1c No results for input(s): HGBA1C in the last 72 hours. Lipid Profile No results for input(s): CHOL, HDL, LDLCALC, TRIG, CHOLHDL, LDLDIRECT in the last 72 hours. Thyroid function studies No results for input(s): TSH, T4TOTAL, T3FREE, THYROIDAB in the last 72 hours.  Invalid input(s): FREET3 Anemia work up No results for input(s): VITAMINB12, FOLATE, FERRITIN, TIBC, IRON, RETICCTPCT in the last 72 hours. Urinalysis    Component Value Date/Time   COLORURINE  YELLOW 09/27/2019 1924   APPEARANCEUR CLEAR 09/27/2019 1924   LABSPEC 1.017 09/27/2019 1924   PHURINE 6.0 09/27/2019 1924   GLUCOSEU >=500 (A) 09/27/2019 1924   HGBUR NEGATIVE 09/27/2019 1924   BILIRUBINUR NEGATIVE 09/27/2019 1924   KETONESUR 5 (A) 09/27/2019 1924   PROTEINUR 30 (A) 09/27/2019 1924   NITRITE NEGATIVE 09/27/2019 1924   LEUKOCYTESUR NEGATIVE 09/27/2019 1924   Sepsis Labs Invalid input(s): PROCALCITONIN,  WBC,  LACTICIDVEN Microbiology Recent Results (from the past 240 hour(s))  Blood Culture (routine x 2)     Status: None   Collection Time: 09/27/19  5:18 PM   Specimen: BLOOD  Result Value Ref Range Status   Specimen Description   Final    BLOOD LEFT ANTECUBITAL Performed at [facility], [address] [place], [place] [address]    Special Requests   Final    BOTTLES DRAWN AEROBIC AND ANAEROBIC Blood Culture results may not be optimal due to an excessive volume of blood received in culture bottles Performed at [facility], [address], [place], [place] [address]    Culture   Final    NO GROWTH 5 DAYS Performed at [facility], [address] [place], [place] [address]    Report Status 10/02/2019 FINAL  Final  Respiratory Panel by RT PCR (Flu A&B, Covid) - Nasopharyngeal Swab     Status: None   Collection Time: 09/27/19  5:23 PM   Specimen: Nasopharyngeal Swab  Result Value Ref Range Status   SARS Coronavirus 2 by RT PCR NEGATIVE NEGATIVE Final    Comment: (NOTE) SARS-CoV-2 target nucleic acids are NOT DETECTED. The SARS-CoV-2 RNA is generally detectable in upper respiratoy specimens during the acute phase of infection. The lowest concentration of SARS-CoV-2 viral copies this assay can detect is 131 copies/mL. A negative result does not preclude SARS-Cov-2 infection and should not be used  as the sole basis for treatment or other patient management decisions. A negative result may occur with  improper  specimen collection/handling, submission of specimen other than nasopharyngeal swab, presence of viral mutation(s) within the areas targeted by this assay, and inadequate number of viral copies (<131 copies/mL). A negative result must be combined with clinical observations, patient history, and epidemiological information. The expected result is Negative. Fact Sheet for Patients:  https://www.moore.com/ Fact Sheet for Healthcare Providers:  https://www.young.biz/ This test is not yet ap proved or cleared by the [place] FDA and  has been authorized for detection and/or diagnosis of SARS-CoV-2 by FDA under an Emergency Use Authorization (EUA). This EUA will remain  in effect (meaning this test can be used) for the duration of the COVID-19 declaration under Section 564(b)(1) of the Act, 21 U.S.C. section 360bbb-3(b)(1), unless the authorization is terminated or revoked sooner.    Influenza A by PCR NEGATIVE NEGATIVE Final   Influenza B by PCR NEGATIVE NEGATIVE Final    Comment: (NOTE) The Xpert Xpress SARS-CoV-2/FLU/RSV assay is intended as an aid in  the diagnosis of influenza from Nasopharyngeal swab specimens and  should not be used as a sole basis for treatment. Nasal washings and  aspirates are unacceptable for Xpert Xpress SARS-CoV-2/FLU/RSV  testing. Fact Sheet for Patients: https://www.moore.com/ Fact Sheet for Healthcare Providers: https://www.young.biz/ This test is not yet approved or cleared by the [place] FDA and  has been authorized for detection and/or diagnosis of SARS-CoV-2 by  FDA under an Emergency Use Authorization (EUA). This EUA will remain  in effect (meaning this test can be used) for the duration of the  Covid-19 declaration under Section 564(b)(1) of the Act, 21  U.S.C. section 360bbb-3(b)(1), unless the authorization is  terminated or revoked. Performed at [facility], [address], [place], [place] [address]   Blood Culture (routine x 2)     Status: None   Collection Time: 09/27/19  5:33 PM   Specimen: BLOOD  Result Value Ref Range Status   Specimen Description   Final    BLOOD RIGHT ANTECUBITAL Performed at [facility], [address], [place], [place] [address]    Special Requests   Final    BOTTLES DRAWN AEROBIC AND ANAEROBIC Blood Culture adequate volume Performed at [facility], [address] [place], [place] [address]    Culture   Final    NO GROWTH 5 DAYS Performed at [facility], [address], [place], [place] [address]    Report Status 10/02/2019 FINAL  Final  Urine culture     Status: None   Collection Time: 09/27/19  7:24 PM   Specimen: In/Out Cath Urine  Result Value Ref Range Status   Specimen Description   Final    IN/OUT CATH URINE Performed at [facility], [address] [place], [place] [address]    Special Requests   Final    NONE Performed at [facility], [address], [place], [place] [address]    Culture   Final    NO GROWTH Performed at [facility], [address], [place], [place] [address]    Report Status 09/28/2019 FINAL  Final  SARS CORONAVIRUS 2 (TAT 6-24 HRS) Nasopharyngeal Nasopharyngeal Swab     Status: None   Collection Time: 10/04/19 12:59 PM   Specimen: Nasopharyngeal Swab  Result Value Ref Range Status   SARS Coronavirus 2 NEGATIVE NEGATIVE Final  Comment: (NOTE) SARS-CoV-2 target nucleic acids are NOT DETECTED. The SARS-CoV-2 RNA is generally detectable in upper and lower respiratory specimens during the acute phase of infection. Negative results do not preclude SARS-CoV-2 infection, do not rule out co-infections with other pathogens, and should not be used as the sole basis for treatment or other patient management decisions. Negative results must be combined with  clinical observations, patient history, and epidemiological information. The expected result is Negative. Fact Sheet for Patients: HairSlick.no Fact Sheet for Healthcare Providers: quierodirigir.com This test is not yet approved or cleared by the [place] FDA and  has been authorized for detection and/or diagnosis of SARS-CoV-2 by FDA under an Emergency Use Authorization (EUA). This EUA will remain  in effect (meaning this test can be used) for the duration of the COVID-19 declaration under Section 56 4(b)(1) of the Act, 21 U.S.C. section 360bbb-3(b)(1), unless the authorization is terminated or revoked sooner. Performed at [facility], [address], [place], [place] [address]    Time spent:  SIGNED:   Rickey Barbara, MD  [facility] 10/05/2019, 12:56 PM  If 7PM-7AM, please contact night-coverage

## 2019-10-05 NOTE — Progress Notes (Signed)
SATURATION QUALIFICATIONS: (This note is used to comply with regulatory documentation for home oxygen)  Patient Saturations on Room Air at Rest = 98%  Patient Saturations on Room Air while Ambulating = 94%  Patient Saturations on  Liters of oxygen while Ambulating = %  Please briefly explain why patient needs home oxygen:Patient will not need oxygen at home, saturations remained at or above 94% while ambulating.

## 2021-03-08 ENCOUNTER — Observation Stay (HOSPITAL_COMMUNITY)
Admission: EM | Admit: 2021-03-08 | Discharge: 2021-03-10 | Disposition: A | Discharge status: Home / Self Care | Payer: Medicare Other | Attending: Family Medicine | Admitting: Family Medicine

## 2021-03-08 ENCOUNTER — Other Ambulatory Visit: Payer: Self-pay

## 2021-03-08 ENCOUNTER — Emergency Department (HOSPITAL_COMMUNITY): Payer: Medicare Other

## 2021-03-08 ENCOUNTER — Encounter (HOSPITAL_COMMUNITY): Payer: Self-pay | Admitting: *Deleted

## 2021-03-08 ENCOUNTER — Observation Stay (HOSPITAL_COMMUNITY): Payer: Medicare Other

## 2021-03-08 DIAGNOSIS — Z79899 Other long term (current) drug therapy: Secondary | ICD-10-CM | POA: Diagnosis not present

## 2021-03-08 DIAGNOSIS — S82141A Displaced bicondylar fracture of right tibia, initial encounter for closed fracture: Principal | ICD-10-CM | POA: Insufficient documentation

## 2021-03-08 DIAGNOSIS — Y92129 Unspecified place in nursing home as the place of occurrence of the external cause: Secondary | ICD-10-CM | POA: Diagnosis not present

## 2021-03-08 DIAGNOSIS — J45909 Unspecified asthma, uncomplicated: Secondary | ICD-10-CM | POA: Insufficient documentation

## 2021-03-08 DIAGNOSIS — Z20822 Contact with and (suspected) exposure to covid-19: Secondary | ICD-10-CM | POA: Insufficient documentation

## 2021-03-08 DIAGNOSIS — M899 Disorder of bone, unspecified: Secondary | ICD-10-CM | POA: Diagnosis present

## 2021-03-08 DIAGNOSIS — I5032 Chronic diastolic (congestive) heart failure: Secondary | ICD-10-CM | POA: Diagnosis not present

## 2021-03-08 DIAGNOSIS — I110 Hypertensive heart disease with heart failure: Secondary | ICD-10-CM | POA: Diagnosis not present

## 2021-03-08 DIAGNOSIS — Z7982 Long term (current) use of aspirin: Secondary | ICD-10-CM | POA: Diagnosis not present

## 2021-03-08 DIAGNOSIS — F0390 Unspecified dementia without behavioral disturbance: Secondary | ICD-10-CM | POA: Insufficient documentation

## 2021-03-08 DIAGNOSIS — X509XXA Other and unspecified overexertion or strenuous movements or postures, initial encounter: Secondary | ICD-10-CM | POA: Diagnosis not present

## 2021-03-08 DIAGNOSIS — G3184 Mild cognitive impairment, so stated: Secondary | ICD-10-CM | POA: Diagnosis present

## 2021-03-08 DIAGNOSIS — M949 Disorder of cartilage, unspecified: Secondary | ICD-10-CM | POA: Diagnosis present

## 2021-03-08 DIAGNOSIS — Z8673 Personal history of transient ischemic attack (TIA), and cerebral infarction without residual deficits: Secondary | ICD-10-CM | POA: Diagnosis not present

## 2021-03-08 DIAGNOSIS — H9190 Unspecified hearing loss, unspecified ear: Secondary | ICD-10-CM

## 2021-03-08 DIAGNOSIS — E119 Type 2 diabetes mellitus without complications: Secondary | ICD-10-CM

## 2021-03-08 DIAGNOSIS — S8991XA Unspecified injury of right lower leg, initial encounter: Secondary | ICD-10-CM | POA: Diagnosis present

## 2021-03-08 DIAGNOSIS — I2510 Atherosclerotic heart disease of native coronary artery without angina pectoris: Secondary | ICD-10-CM | POA: Diagnosis not present

## 2021-03-08 DIAGNOSIS — I639 Cerebral infarction, unspecified: Secondary | ICD-10-CM | POA: Diagnosis not present

## 2021-03-08 DIAGNOSIS — R0682 Tachypnea, not elsewhere classified: Secondary | ICD-10-CM

## 2021-03-08 DIAGNOSIS — S82143A Displaced bicondylar fracture of unspecified tibia, initial encounter for closed fracture: Secondary | ICD-10-CM | POA: Diagnosis present

## 2021-03-08 DIAGNOSIS — I10 Essential (primary) hypertension: Secondary | ICD-10-CM | POA: Diagnosis present

## 2021-03-08 DIAGNOSIS — I6381 Other cerebral infarction due to occlusion or stenosis of small artery: Secondary | ICD-10-CM | POA: Diagnosis not present

## 2021-03-08 DIAGNOSIS — Z7901 Long term (current) use of anticoagulants: Secondary | ICD-10-CM | POA: Diagnosis not present

## 2021-03-08 LAB — BASIC METABOLIC PANEL
Anion gap: 9 (ref 5–15)
BUN: 23 mg/dL (ref 8–23)
CO2: 26 mmol/L (ref 22–32)
Calcium: 9.2 mg/dL (ref 8.9–10.3)
Chloride: 100 mmol/L (ref 98–111)
Creatinine, Ser: 0.77 mg/dL (ref 0.44–1.00)
GFR, Estimated: >60 mL/min (ref >60)
Glucose, Bld: 195 mg/dL — ABNORMAL HIGH (ref 70–99)
Potassium: 4 mmol/L (ref 3.5–5.1)
Sodium: 135 mmol/L (ref 135–145)

## 2021-03-08 LAB — CBC WITH DIFFERENTIAL/PLATELET
Abs Immature Granulocytes: 0.03 10*3/uL (ref 0.00–0.07)
Basophils Absolute: 0 10*3/uL (ref 0.0–0.1)
Basophils Relative: 0 %
Eosinophils Absolute: 0 10*3/uL (ref 0.0–0.5)
Eosinophils Relative: 0 %
HCT: 33.3 % — ABNORMAL LOW (ref 36.0–46.0)
Hemoglobin: 11 g/dL — ABNORMAL LOW (ref 12.0–15.0)
Immature Granulocytes: 0 %
Lymphocytes Relative: 12 %
Lymphs Abs: 0.9 10*3/uL (ref 0.7–4.0)
MCH: 29.4 pg (ref 26.0–34.0)
MCHC: 33 g/dL (ref 30.0–36.0)
MCV: 89 fL (ref 80.0–100.0)
Monocytes Absolute: 0.8 10*3/uL (ref 0.1–1.0)
Monocytes Relative: 11 %
Neutro Abs: 5.6 10*3/uL (ref 1.7–7.7)
Neutrophils Relative %: 77 %
Platelets: 275 10*3/uL (ref 150–400)
RBC: 3.74 MIL/uL — ABNORMAL LOW (ref 3.87–5.11)
RDW: 14.6 % (ref 11.5–15.5)
WBC: 7.4 10*3/uL (ref 4.0–10.5)
nRBC: 0 % (ref 0.0–0.2)

## 2021-03-08 LAB — CBG MONITORING, ED: Glucose-Capillary: 177 mg/dL — ABNORMAL HIGH (ref 70–99)

## 2021-03-08 MED ORDER — ADULT MULTIVITAMIN W/MINERALS CH
1.0000 | ORAL_TABLET | Freq: Every day | ORAL | Status: DC
  Administered 2021-03-09 – 2021-03-10 (×2): 1 via ORAL
  Filled 2021-03-08 (×2): qty 1

## 2021-03-08 MED ORDER — ASPIRIN 81 MG PO CHEW
81.0000 mg | CHEWABLE_TABLET | Freq: Every day | ORAL | Status: DC
  Administered 2021-03-09 – 2021-03-10 (×2): 81 mg via ORAL
  Filled 2021-03-08 (×2): qty 1

## 2021-03-08 MED ORDER — ONDANSETRON HCL 4 MG/2ML IJ SOLN
4.0000 mg | Freq: Four times a day (QID) | INTRAMUSCULAR | Status: DC | PRN
Start: 2021-03-08 — End: 2021-03-10

## 2021-03-08 MED ORDER — MIRABEGRON ER 50 MG PO TB24
50.0000 mg | ORAL_TABLET | Freq: Every day | ORAL | Status: DC
  Administered 2021-03-09 – 2021-03-10 (×2): 50 mg via ORAL
  Filled 2021-03-08 (×2): qty 1

## 2021-03-08 MED ORDER — KETOROLAC TROMETHAMINE 15 MG/ML IJ SOLN
15.0000 mg | Freq: Three times a day (TID) | INTRAMUSCULAR | Status: AC
  Administered 2021-03-08 – 2021-03-09 (×3): 15 mg via INTRAVENOUS
  Filled 2021-03-08 (×3): qty 1

## 2021-03-08 MED ORDER — DOCUSATE SODIUM 100 MG PO CAPS
100.0000 mg | ORAL_CAPSULE | Freq: Every day | ORAL | Status: DC
  Administered 2021-03-09 – 2021-03-10 (×2): 100 mg via ORAL
  Filled 2021-03-08 (×2): qty 1

## 2021-03-08 MED ORDER — INSULIN ASPART 100 UNIT/ML IJ SOLN
0.0000 [IU] | Freq: Every day | INTRAMUSCULAR | Status: DC
  Administered 2021-03-09: 2 [IU] via SUBCUTANEOUS

## 2021-03-08 MED ORDER — ENOXAPARIN SODIUM 40 MG/0.4ML IJ SOSY
40.0000 mg | PREFILLED_SYRINGE | Freq: Every day | INTRAMUSCULAR | Status: DC
  Administered 2021-03-08 – 2021-03-09 (×2): 40 mg via SUBCUTANEOUS
  Filled 2021-03-08 (×2): qty 0.4

## 2021-03-08 MED ORDER — POLYETHYLENE GLYCOL 3350 17 G PO PACK: 17.0000 g | PACK | Freq: Every day | ORAL | Status: DC | PRN

## 2021-03-08 MED ORDER — ROSUVASTATIN CALCIUM 5 MG PO TABS
5.0000 mg | ORAL_TABLET | Freq: Every day | ORAL | Status: DC
  Administered 2021-03-09 – 2021-03-10 (×2): 5 mg via ORAL
  Filled 2021-03-08 (×2): qty 1

## 2021-03-08 MED ORDER — METOPROLOL SUCCINATE ER 25 MG PO TB24
25.0000 mg | ORAL_TABLET | Freq: Every day | ORAL | Status: DC
  Administered 2021-03-09 – 2021-03-10 (×2): 25 mg via ORAL
  Filled 2021-03-08 (×2): qty 1

## 2021-03-08 MED ORDER — MORPHINE SULFATE (PF) 4 MG/ML IV SOLN
4.0000 mg | Freq: Once | INTRAVENOUS | Status: AC
  Administered 2021-03-08: 4 mg via INTRAVENOUS
  Filled 2021-03-08: qty 1

## 2021-03-08 MED ORDER — PSYLLIUM 95 % PO PACK
1.0000 | PACK | Freq: Every day | ORAL | Status: DC
  Administered 2021-03-09 – 2021-03-10 (×2): 1 via ORAL
  Filled 2021-03-08 (×2): qty 1

## 2021-03-08 MED ORDER — CLOPIDOGREL BISULFATE 75 MG PO TABS
75.0000 mg | ORAL_TABLET | Freq: Every day | ORAL | Status: DC
  Administered 2021-03-09 – 2021-03-10 (×2): 75 mg via ORAL
  Filled 2021-03-08 (×2): qty 1

## 2021-03-08 MED ORDER — LORATADINE 10 MG PO TABS: 10.0000 mg | ORAL_TABLET | Freq: Every day | ORAL | Status: DC | PRN

## 2021-03-08 MED ORDER — DONEPEZIL HCL 10 MG PO TABS
10.0000 mg | ORAL_TABLET | Freq: Every day | ORAL | Status: DC
  Administered 2021-03-08 – 2021-03-09 (×2): 10 mg via ORAL
  Filled 2021-03-08 (×2): qty 1

## 2021-03-08 MED ORDER — MELATONIN 3 MG PO TABS: 3.0000 mg | ORAL_TABLET | Freq: Every evening | ORAL | Status: DC | PRN

## 2021-03-08 MED ORDER — ESCITALOPRAM OXALATE 10 MG PO TABS
5.0000 mg | ORAL_TABLET | Freq: Every day | ORAL | Status: DC
  Administered 2021-03-09 – 2021-03-10 (×2): 5 mg via ORAL
  Filled 2021-03-08 (×2): qty 1

## 2021-03-08 MED ORDER — AMLODIPINE-OLMESARTAN 5-40 MG PO TABS: 1.0000 | ORAL_TABLET | Freq: Every day | ORAL | Status: DC

## 2021-03-08 MED ORDER — IRBESARTAN 300 MG PO TABS
300.0000 mg | ORAL_TABLET | Freq: Every day | ORAL | Status: DC
  Administered 2021-03-09 – 2021-03-10 (×2): 300 mg via ORAL
  Filled 2021-03-08 (×2): qty 1

## 2021-03-08 MED ORDER — FERROUS SULFATE 325 (65 FE) MG PO TABS
650.0000 mg | ORAL_TABLET | Freq: Every day | ORAL | Status: DC
  Administered 2021-03-09 – 2021-03-10 (×2): 650 mg via ORAL
  Filled 2021-03-08 (×2): qty 2

## 2021-03-08 MED ORDER — ALBUTEROL SULFATE (2.5 MG/3ML) 0.083% IN NEBU: 2.5000 mg | INHALATION_SOLUTION | RESPIRATORY_TRACT | Status: DC | PRN

## 2021-03-08 MED ORDER — VITAMIN D 25 MCG (1000 UNIT) PO TABS
1000.0000 [IU] | ORAL_TABLET | Freq: Every day | ORAL | Status: DC
  Administered 2021-03-09 – 2021-03-10 (×2): 1000 [IU] via ORAL
  Filled 2021-03-08 (×2): qty 1

## 2021-03-08 MED ORDER — ACETAMINOPHEN 325 MG PO TABS
650.0000 mg | ORAL_TABLET | Freq: Four times a day (QID) | ORAL | Status: DC | PRN
  Administered 2021-03-09 (×2): 650 mg via ORAL
  Filled 2021-03-08 (×2): qty 2

## 2021-03-08 MED ORDER — CALCIUM CARBONATE-VITAMIN D 500-200 MG-UNIT PO TABS
1.0000 | ORAL_TABLET | Freq: Every day | ORAL | Status: DC
  Administered 2021-03-09 – 2021-03-10 (×2): 1 via ORAL
  Filled 2021-03-08 (×3): qty 1

## 2021-03-08 MED ORDER — BUDESONIDE 0.5 MG/2ML IN SUSP
0.5000 mg | Freq: Two times a day (BID) | RESPIRATORY_TRACT | Status: DC
  Administered 2021-03-09 – 2021-03-10 (×3): 0.5 mg via RESPIRATORY_TRACT
  Filled 2021-03-08 (×3): qty 2

## 2021-03-08 MED ORDER — AMLODIPINE BESYLATE 5 MG PO TABS
5.0000 mg | ORAL_TABLET | Freq: Every day | ORAL | Status: DC
  Administered 2021-03-09 – 2021-03-10 (×2): 5 mg via ORAL
  Filled 2021-03-08 (×2): qty 1

## 2021-03-08 MED ORDER — INSULIN ASPART 100 UNIT/ML IJ SOLN
0.0000 [IU] | Freq: Three times a day (TID) | INTRAMUSCULAR | Status: DC
  Administered 2021-03-09: 1 [IU] via SUBCUTANEOUS
  Administered 2021-03-10: 2 [IU] via SUBCUTANEOUS

## 2021-03-08 MED ORDER — TRAMADOL HCL 50 MG PO TABS: 50.0000 mg | ORAL_TABLET | Freq: Once | ORAL | Status: DC

## 2021-03-08 NOTE — Progress Notes (Signed)
Orthopedic Tech Progress Note Patient Details:  [name] 1923/07/12 [id number]  Ortho Devices Type of Ortho Device: Knee Immobilizer Ortho Device/Splint Location: RLE Ortho Device/Splint Interventions: Ordered, Application, Adjustment   Post Interventions Patient Tolerated: Fair Instructions Provided: Adjustment of device, Care of device, Poper ambulation with device  Jasson Siegmann 03/08/2021, 11:44 PM

## 2021-03-08 NOTE — ED Triage Notes (Signed)
The pt twisted her rt knee yesterday   since then the rt knee has been swollen  and painful

## 2021-03-08 NOTE — ED Notes (Signed)
Paged ortho tech regarding knee immobilizer.

## 2021-03-08 NOTE — ED Provider Notes (Signed)
[facility] EM[facility]GENC[facility] Provider Note   CSN: [id number] Arrival date & time: 03/08/21  1617     History Chief Complaint  Patient presents with   Knee Injury    [name] is a 87 y.o. female history of diabetes, previous stroke, here presenting with right knee pain.  Patient is from independent living facility at [facility].  She apparently twisted her knee and complained of knee pain and swelling.  Patient unable to give me much detail about how she injured her knee.  Denies falling or other injuries.  Patient had a hard time bearing weight on the right knee so was brought in for further evaluation  The history is provided by the patient.      Past Medical History:  Diagnosis Date   Diabetes (HCC)    Hearing loss    Thalamic infarction [facility](HCC)     Patient Active Problem List   Diagnosis Date Noted   Tibial plateau fracture 03/08/2021   Bilateral pleural effusion 10/03/2019   Acute respiratory failure with hypoxemia (HCC) 10/03/2019   Severe sepsis (HCC) 09/28/2019   Fever 09/28/2019   Generalized weakness 09/28/2019   Dehydration 09/28/2019   LLL pneumonia 09/27/2019   Mild cognitive impairment with memory loss 06/08/2014   SORE THROAT 04/10/2010   Dyspnea 10/25/2009   Cough 10/25/2009   HYPERLIPIDEMIA 10/24/2009   DEPRESSION 10/24/2009   Essential hypertension 10/24/2009   CAD 10/24/2009   ALLERGIC RHINITIS, SEASONAL 10/24/2009   Asthma 10/24/2009   OSTEOPENIA 10/24/2009    Past Surgical History:  Procedure Laterality Date   fractured rib       OB History   No obstetric history on file.     Family History  Problem Relation Age of Onset   Cancer Mother    Heart disease Sister    Diabetes Other    Stroke Other     Social History   Tobacco Use   Smoking status: Never   Smokeless tobacco: Never  Substance Use Topics   Alcohol use: No   Drug use: No    Home Medications Prior to Admission medications    Medication Sig Start Date End Date Taking? Authorizing Provider  amLODipine-olmesartan (AZOR) 5-40 MG tablet Take 1 tablet by mouth daily.  06/13/16   [provider]  aspirin 81 MG chewable tablet Chew 81 mg by mouth daily.    [provider]  carbamide peroxide (GNP EARWAX REMOVAL DROPS) 6.5 % OTIC solution Place 5 drops into both ears 2 (two) times daily as needed (ear wax buildup).    [provider]  Cholecalciferol 25 MCG (1000 UT) capsule Take 1,000 Units by mouth daily.     [provider]  clopidogrel (PLAVIX) 75 MG tablet TAKE 1 TABLET BY MOUTH DAILY Patient taking differently: Take 75 mg by mouth daily.  08/28/16   Micki RileySethi, Pramod S, MD  docusate sodium (COLACE) 100 MG capsule Take 100 mg by mouth daily.    [provider]  donepezil (ARICEPT) 5 MG tablet Take 10 mg by mouth at bedtime.  06/13/16   [provider]  escitalopram (LEXAPRO) 5 MG tablet Take 5 mg by mouth daily.    [provider]  Ferrous Sulfate (IRON) 325 (65 Fe) MG TABS Take 650 mg by mouth daily.    [provider]  Fluticasone Propionate, Inhal, (FLOVENT DISKUS) 250 MCG/BLIST AEPB Inhale 1 puff into the lungs 2 (two) times daily.    [provider]  loratadine (CLARITIN) 10 MG tablet Take 10 mg by mouth daily as needed for allergies.     [provider]  metoprolol succinate (TOPROL-XL) 25 MG 24 hr tablet Take 25 mg by mouth daily.  05/20/14   [provider]  mometasone-formoterol (DULERA) 100-5 MCG/ACT AERO Inhale 2 puffs into the lungs 2 (two) times daily. Patient not taking: Reported on 08/15/2017 08/14/16   Parrett, Virgel Bouquet, NP  Multiple Vitamin (MULTIVITAMIN) tablet Take 1 tablet by mouth daily.    [provider]  MYRBETRIQ 50 MG TB24 tablet Take 50 mg by mouth daily.  06/13/16   [provider]  PROAIR HFA 108 (90 BASE) MCG/ACT inhaler Inhale 2 puffs into the lungs every 4 (four) hours as needed for  wheezing.  02/21/15   [provider]  Psyllium (KONSYL DAILY FIBER) 28.3 % PACK Take 1 each by mouth daily.    [provider]  rosuvastatin (CRESTOR) 5 MG tablet Take 5 mg by mouth daily.    [provider]  Spacer/Aero-Holding Chambers (AEROCHAMBER MV) inhaler Use as instructed 08/15/16   Parrett, Virgel Bouquet, NP    Allergies    Tussionex pennkinetic er [hydrocod polst-cpm polst er], Benzodiazepines, Sulfonamide derivatives, and Tricyclic antidepressants  Review of Systems   Review of Systems  Musculoskeletal:        R knee pain   All other systems reviewed and are negative.  Physical Exam Updated Vital Signs BP (!) 170/68   Pulse 85   Temp 98.4 F (36.9 C) (Oral)   Resp (!) 23   Ht 5\' 3"  (1.6 m)   Wt 54.9 kg   SpO2 98%   BMI 21.44 kg/m   Physical Exam Vitals and nursing note reviewed.  Constitutional:      Appearance: Normal appearance.  HENT:     Head: Normocephalic.     Nose: Nose normal.     Mouth/Throat:     Mouth: Mucous membranes are moist.  Eyes:     Extraocular Movements: Extraocular movements intact.     Pupils: Pupils are equal, round, and reactive to light.  Cardiovascular:     Rate and Rhythm: Normal rate and regular rhythm.     Pulses: Normal pulses.     Heart sounds: Normal heart sounds.  Pulmonary:     Effort: Pulmonary effort is normal.     Breath sounds: Normal breath sounds.  Abdominal:     General: Abdomen is flat.     Palpations: Abdomen is soft.  Musculoskeletal:     Cervical back: Normal range of motion.     Comments: Right knee with obvious effusion.  Decreased range of motion.  Patient does have normal pedal pulses  Skin:    General: Skin is warm.     Capillary Refill: Capillary refill takes less than 2 seconds.  Neurological:     General: No focal deficit present.     Mental Status: She is alert and oriented to person, place, and time.  Psychiatric:        Mood and Affect: Mood normal.        Behavior:  Behavior normal.    ED Results / Procedures / Treatments   Labs (all labs ordered are listed, but only abnormal results are displayed) Labs Reviewed  BASIC METABOLIC PANEL - Abnormal; Notable for the following components:      Result Value   Glucose, Bld 195 (*)    All other components within normal limits  CBC WITH DIFFERENTIAL/PLATELET -  Abnormal; Notable for the following components:   RBC 3.74 (*)    Hemoglobin 11.0 (*)    HCT 33.3 (*)    All other components within normal limits  CBG MONITORING, ED - Abnormal; Notable for the following components:   Glucose-Capillary 177 (*)    All other components within normal limits  RESP PANEL BY RT-PCR (FLU A&B, COVID) ARPGX2  HEMOGLOBIN A1C  URINALYSIS, ROUTINE W REFLEX MICROSCOPIC  VITAMIN D 25 HYDROXY (VIT D DEFICIENCY, FRACTURES)  CBC  BASIC METABOLIC PANEL    EKG None  Radiology CT Knee Right Wo Contrast  Result Date: 03/08/2021 CLINICAL DATA:  Right knee pain and swelling after twisting injury yesterday. EXAM: CT OF THE RIGHT KNEE WITHOUT CONTRAST TECHNIQUE: Multidetector CT imaging of the right knee was performed according to the standard protocol. Multiplanar CT image reconstructions were also generated. COMPARISON:  Right knee x-rays from same day. FINDINGS: Bones/Joint/Cartilage Acute minimally displaced fracture of the posterior lateral tibial plateau. Acute minimally depressed fracture of the posterior medial tibial plateau with associated nondisplaced longitudinal component involving the posterior metaphyseal cortex (series 6, image 51). No dislocation. Moderate to severe medial compartment joint space narrowing with subchondral sclerosis and cystic change. Small tricompartmental marginal osteophytes. Osteopenia. Large lipohemarthrosis. Ligaments Ligaments are suboptimally evaluated by CT. Muscles and Tendons Grossly intact. Soft tissue No fluid collection or hematoma.  No soft tissue mass. IMPRESSION: 1. Acute bicondylar  tibial plateau fracture as described above. 2. Large lipohemarthrosis. 3. Moderate to severe medial compartment osteoarthritis. Electronically Signed   By: Obie Dredge M.D.   On: 03/08/2021 21:25   DG Knee Complete 4 Views Right  Result Date: 03/08/2021 CLINICAL DATA:  Knee pain following twisting. EXAM: RIGHT KNEE - COMPLETE 4+ VIEW COMPARISON:  None FINDINGS: Osteopenia. Subtle lucency in the lateral tibial plateau seen on the oblique view. No sign of frank displaced fracture. Mild irregularity along the posterior aspect of the tibial plateau as well w. degenerative changes greatest in the medial compartment. Signs of vascular calcification in the soft tissues. Moderate joint effusion.  This is in the suprapatellar recess. IMPRESSION: 1. Moderate joint effusion. 2. Question nondisplaced injury to the medial tibial plateau. Electronically Signed   By: Donzetta Kohut M.D.   On: 03/08/2021 18:29    Procedures Procedures   Medications Ordered in ED Medications  enoxaparin (LOVENOX) injection 40 mg (has no administration in time range)  insulin aspart (novoLOG) injection 0-9 Units (has no administration in time range)  insulin aspart (novoLOG) injection 0-5 Units (0 Units Subcutaneous Not Given 03/08/21 2258)  morphine 4 MG/ML injection 4 mg (4 mg Intravenous Given 03/08/21 2121)    ED Course  I have reviewed the triage vital signs and the nursing notes.  Pertinent labs & imaging results that were available during my care of the patient were reviewed by me and considered in my medical decision making (see chart for details).    MDM Rules/Calculators/A&P                         Shanya D Wojdyla is a 87 y.o. female here presenting with right knee pain.  Patient twisted her knee yesterday.  Patient has obvious knee effusion.  Will get x-ray to rule out fracture and give pain medicine    10 pm Xray showed possible tibial plateau fracture. CT confirmed it. Discussed with Dr. Ave Filter from  ortho. He recommend knee immobilizer and non weight bearing and admission for pain  control. Ortho to see in the morning but no surgical plan. Case manager called facility and they are only assisted living and has no nursing facility or rehab.    Final Clinical Impression(s) / ED Diagnoses Final diagnoses:  Closed fracture of right tibial plateau, initial encounter    Rx / DC Orders ED Discharge Orders     None        Charlynne Pander, MD 03/08/21 2303

## 2021-03-08 NOTE — H&P (Addendum)
History and Physical  [name]:[id number] DOB: 1923/08/09 DOA: 03/08/2021  Referring physician: Dr. Silverio Lay, EDP. PCP: Chilton Greathouse, MD  Outpatient Specialists: Pulmonary Patient coming from: Independent living facility.  Chief Complaint: Right knee pain  HPI: [name] is a 87 y.o. female with medical history significant for osteopenia, asthma, hypertension, type 2 diabetes, hyperlipidemia, mild cognitive impairment, who presented to [facility] ED from her independent living facility due to severe right knee pain and effusion with onset last night after twisting her right knee.  Last night patient was in the process of going out to celebrate her birthday with friends.  She was assisted from her wheelchair and into a motor vehicle, during the transfer she twisted her right knee.  Since then she has had pain with difficulty weightbearing on her right lower extremity.  She developed right knee effusion.  No falls.  She was brought into the ED for further evaluation.  She is accompanied in the room by one of her 3 sons.  Her pain is improved after receiving 4 mg of IV morphine.  Not lethargic.  Alert and interactive.  EDP discussed the case with orthopedic surgery who recommended placement of a knee stabilizer.  This was placed in her room in the ED at the time of this visit.  TRH, hospitalist team, was asked to admit.  ED Course:  Temperature 98.4.  BP 154/66, pulse 76, respiratory rate 15, O2 saturation 97% on room air.  Lab studies significant for serum sodium 135, potassium 4.0, serum bicarb 26, serum glucose 195, BUN 23, creatinine 0.77, GFR greater than 60.  WBC 7.4, hemoglobin 11.0, MCV 89, platelet count 275, neutrophil count 5.6.  Chest x-ray nonacute.  UA pending.  COVID-19 screening test pending.  Review of Systems: Review of systems as noted in the HPI. All other systems reviewed and are negative.   Past Medical History:  Diagnosis Date   Diabetes (HCC)    Hearing loss     Thalamic infarction [facility])    Past Surgical History:  Procedure Laterality Date   fractured rib      Social History:  reports that she has never smoked. She has never used smokeless tobacco. She reports that she does not drink alcohol and does not use drugs.   Allergies  Allergen Reactions   Tussionex Pennkinetic Er [Hydrocod Polst-Cpm Polst Er]     Lethargy- Had to reverse hydrocodone with Narcan.   Benzodiazepines Other (See Comments)    Family reports high sensitivity   Sulfonamide Derivatives Other (See Comments)    Nervous, agitation   Tricyclic Antidepressants Other (See Comments)    Family reports high sensitivity    Family History  Problem Relation Age of Onset   Cancer Mother    Heart disease Sister    Diabetes Other    Stroke Other       Prior to Admission medications   Medication Sig Start Date End Date Taking? Authorizing Provider  amLODipine-olmesartan (AZOR) 5-40 MG tablet Take 1 tablet by mouth daily.  06/13/16   [provider]  aspirin 81 MG chewable tablet Chew 81 mg by mouth daily.    [provider]  carbamide peroxide (GNP EARWAX REMOVAL DROPS) 6.5 % OTIC solution Place 5 drops into both ears 2 (two) times daily as needed (ear wax buildup).    [provider]  Cholecalciferol 25 MCG (1000 UT) capsule Take 1,000 Units by mouth daily.     [provider]  clopidogrel (PLAVIX)  75 MG tablet TAKE 1 TABLET BY MOUTH DAILY Patient taking differently: Take 75 mg by mouth daily.  08/28/16   Micki Riley, MD  docusate sodium (COLACE) 100 MG capsule Take 100 mg by mouth daily.    [provider]  donepezil (ARICEPT) 5 MG tablet Take 10 mg by mouth at bedtime.  06/13/16   [provider]  escitalopram (LEXAPRO) 5 MG tablet Take 5 mg by mouth daily.    [provider]  Ferrous Sulfate (IRON) 325 (65 Fe) MG TABS Take 650 mg by mouth daily.    [provider]  Fluticasone Propionate, Inhal,  (FLOVENT DISKUS) 250 MCG/BLIST AEPB Inhale 1 puff into the lungs 2 (two) times daily.    [provider]  loratadine (CLARITIN) 10 MG tablet Take 10 mg by mouth daily as needed for allergies.     [provider]  metoprolol succinate (TOPROL-XL) 25 MG 24 hr tablet Take 25 mg by mouth daily.  05/20/14   [provider]  mometasone-formoterol (DULERA) 100-5 MCG/ACT AERO Inhale 2 puffs into the lungs 2 (two) times daily. Patient not taking: Reported on 08/15/2017 08/14/16   Parrett, Virgel Bouquet, NP  Multiple Vitamin (MULTIVITAMIN) tablet Take 1 tablet by mouth daily.    [provider]  MYRBETRIQ 50 MG TB24 tablet Take 50 mg by mouth daily.  06/13/16   [provider]  PROAIR HFA 108 (90 BASE) MCG/ACT inhaler Inhale 2 puffs into the lungs every 4 (four) hours as needed for wheezing.  02/21/15   [provider]  Psyllium (KONSYL DAILY FIBER) 28.3 % PACK Take 1 each by mouth daily.    [provider]  rosuvastatin (CRESTOR) 5 MG tablet Take 5 mg by mouth daily.    [provider]  Spacer/Aero-Holding Chambers (AEROCHAMBER MV) inhaler Use as instructed 08/15/16   Parrett, Virgel Bouquet, NP    Physical Exam: BP (!) 170/68   Pulse 85   Temp 98.4 F (36.9 C) (Oral)   Resp (!) 23   Ht 5\' 3"  (1.6 m)   Wt 54.9 kg   SpO2 98%   BMI 21.44 kg/m   General: 87 y.o. year-old female well developed well nourished in no acute distress.  Alert and interactive. Cardiovascular: Regular rate and rhythm with no rubs or gallops.  No thyromegaly or JVD noted.  No lower extremity edema. 2/4 pulses in all 4 extremities. Respiratory: Clear to auscultation with no wheezes or rales. Good inspiratory effort. Abdomen: Soft nontender nondistended with normal bowel sounds x4 quadrants. Muskuloskeletal: No cyanosis, clubbing noted bilaterally.  Right knee effusion, moderate. Neuro: CN II-XII intact, strength, sensation, reflexes Skin: No ulcerative lesions noted or  rashes Psychiatry: Judgement and insight appear normal. Mood is appropriate for condition and setting          Labs on Admission:  Basic Metabolic Panel: Recent Labs  Lab 03/08/21 1712  NA 135  K 4.0  CL 100  CO2 26  GLUCOSE 195*  BUN 23  CREATININE 0.77  CALCIUM 9.2   Liver Function Tests: No results for input(s): AST, ALT, ALKPHOS, BILITOT, PROT, ALBUMIN in the last 168 hours. No results for input(s): LIPASE, AMYLASE in the last 168 hours. No results for input(s): AMMONIA in the last 168 hours. CBC: Recent Labs  Lab 03/08/21 1712  WBC 7.4  NEUTROABS 5.6  HGB 11.0*  HCT 33.3*  MCV 89.0  PLT 275   Cardiac Enzymes: No results for input(s): CKTOTAL, CKMB, CKMBINDEX, TROPONINI  in the last 168 hours.  BNP (last 3 results) No results for input(s): BNP in the last 8760 hours.  ProBNP (last 3 results) No results for input(s): PROBNP in the last 8760 hours.  CBG: No results for input(s): GLUCAP in the last 168 hours.  Radiological Exams on Admission: CT Knee Right Wo Contrast  Result Date: 03/08/2021 CLINICAL DATA:  Right knee pain and swelling after twisting injury yesterday. EXAM: CT OF THE RIGHT KNEE WITHOUT CONTRAST TECHNIQUE: Multidetector CT imaging of the right knee was performed according to the standard protocol. Multiplanar CT image reconstructions were also generated. COMPARISON:  Right knee x-rays from same day. FINDINGS: Bones/Joint/Cartilage Acute minimally displaced fracture of the posterior lateral tibial plateau. Acute minimally depressed fracture of the posterior medial tibial plateau with associated nondisplaced longitudinal component involving the posterior metaphyseal cortex (series 6, image 51). No dislocation. Moderate to severe medial compartment joint space narrowing with subchondral sclerosis and cystic change. Small tricompartmental marginal osteophytes. Osteopenia. Large lipohemarthrosis. Ligaments Ligaments are suboptimally evaluated by CT.  Muscles and Tendons Grossly intact. Soft tissue No fluid collection or hematoma.  No soft tissue mass. IMPRESSION: 1. Acute bicondylar tibial plateau fracture as described above. 2. Large lipohemarthrosis. 3. Moderate to severe medial compartment osteoarthritis. Electronically Signed   By: Obie DredgeWilliam T Derry M.D.   On: 03/08/2021 21:25   DG Knee Complete 4 Views Right  Result Date: 03/08/2021 CLINICAL DATA:  Knee pain following twisting. EXAM: RIGHT KNEE - COMPLETE 4+ VIEW COMPARISON:  None FINDINGS: Osteopenia. Subtle lucency in the lateral tibial plateau seen on the oblique view. No sign of frank displaced fracture. Mild irregularity along the posterior aspect of the tibial plateau as well w. degenerative changes greatest in the medial compartment. Signs of vascular calcification in the soft tissues. Moderate joint effusion.  This is in the suprapatellar recess. IMPRESSION: 1. Moderate joint effusion. 2. Question nondisplaced injury to the medial tibial plateau. Electronically Signed   By: Donzetta KohutGeoffrey  Wile M.D.   On: 03/08/2021 18:29    EKG: I independently viewed the EKG done and my findings are as followed: Sinus rhythm rate of 79.  Nonspecific ST-T changes QTC 436.    Assessment/Plan Present on Admission:  Tibial plateau fracture  Active Problems:   Tibial plateau fracture  Acute right bicondylar tibial plateau fracture, nontraumatic Osteopenia seen on CT scan Ordered vitamin D 3 level and TSH, follow results Started Os-Cal D, resume home vitamin D3 supplement. EDP discussed case with orthopedic surgery who recommended knee stabilizer. PT OT to assess with orthopedic surgery's guidance Fall precautions Toradol 15 mg 3 times daily x1 day. Patient tolerated 4 mg IV morphine received in the ED. IV morphine 2 mg every 4 hours as needed for severe pain x2 doses, then reassess tolerance.  Patient has had intolerance to opiates in the past, requiring narcan.  Narcan has been ordered PRN. Bowel  regimen as needed to avoid opioid-induced constipation TOC consulted to assist with return of the patient back to the same independent living facility rather than going to an unfamiliar place.  Family has requested that the patient returns to the same ILF.  Agree that it would be best for her, to avoid delirium or further decline in her mental status.  Osteopenia seen on CT scan Unclear if she has a prior history of DEXA scan evaluation Before admission, patient was on vitamin D3 supplement. Added calcium supplement to vitamin D3 with Os-Cal D. Obtain vitamin D3 level, follow results.  History of CVA  Prior to admission, on aspirin, Plavix and Crestor for secondary CVA prevention Restart home regimen.  Essential hypertension Resume home Norvasc 5 mg daily, irbesartan 300 mg daily, Toprol-XL 25 mg daily. Closely monitor vital signs on telemetry  Asthma Stable, not in exacerbation. Her chest x-ray has been personally reviewed, no evidence of lobular infiltrates or edema. Resume home regimen Maintain O2 saturation greater than 90%  Type 2 diabetes with hyperlipidemia Last hemoglobin A1c 7.2 on 09/28/2019 Repeat hemoglobin A1c Sensitive insulin sliding scale Avoid tight glycemic control Avoid hypoglycemia  Iron deficiency anemia Hemoglobin appears to be at baseline 11.0K Resume home ferrous sulfate supplement Repeat CBC in the morning Closely monitor H&H.  Chronic diastolic CHF Last 2D echo done on 10/01/2019 revealed LVEF 70 to 75% with grade 1 diastolic dysfunction Euvolemic on today's exam Closely monitor volume status. Restart home cardiac medications Strict I's and O's and daily weight  Chronic depression Appears stable Resume home Lexapro  Hyperlipidemia/overactive bladder/mild cognitive impairment. Resume home regimen   DVT prophylaxis: Subcu Lovenox daily  Code Status: Full code  Family Communication: 1 of 3 sons at bedside.  Disposition Plan: Admit to  telemetry surgical.  Consults called: Orthopedic surgery, Dr. Ave Filter.  Admission status: Observation status   Status is: Observation   Dispo:  Patient From: Independent living facility  Planned Disposition: Independent living facility or inpatient rehab once orthopedic surgery signs off.  Medically stable for discharge: No      Darlin Drop MD [facility] Pager [phone]  If 7PM-7AM, please contact night-coverage [email or web address] Password [facility]  03/08/2021, 10:54 PM

## 2021-03-08 NOTE — ED Provider Notes (Signed)
Emergency Medicine Provider Triage Evaluation Note  [name] , a 87 y.o. female  was evaluated in triage.  Pt complains of right knee pain. Heard a popping sound night and has had problems ambulating since then. Worse with weight bearing. Has tried ibuprofen with minimal relief. No witnessed falls.   Review of Systems  Positive: Knee pain Negative: Fever, chills   Physical Exam  BP 128/67   Pulse 92   Temp 98.8 F (37.1 C)   Resp 18   Ht 5\' 3"  (1.6 m)   Wt 54.9 kg   SpO2 98%   BMI 21.44 kg/m  Gen:   Awake, no distress   Resp:  Normal effort  MSK:   Moves extremities without difficulty  Other:  Dp PT 2+, very painful with flexion and extension. Patellar tenderness with palpation.   Medical Decision Making  Medically screening exam initiated at 5:13 PM.  Appropriate orders placed.  [name] was informed that the remainder of the evaluation will be completed by another provider, this initial triage assessment does not replace that evaluation, and the importance of remaining in the ED until their evaluation is complete.  Suspect possible ligament tear. Will rule out fracture or dislocation with radiograph and check kidney function and for elevated wbc.    Molly Ar, PA-C 03/08/21 1715    03/10/21, MD 03/08/21 [phone]

## 2021-03-09 DIAGNOSIS — S82141D Displaced bicondylar fracture of right tibia, subsequent encounter for closed fracture with routine healing: Secondary | ICD-10-CM | POA: Diagnosis not present

## 2021-03-09 LAB — CBC
HCT: 32.6 % — ABNORMAL LOW (ref 36.0–46.0)
Hemoglobin: 10.7 g/dL — ABNORMAL LOW (ref 12.0–15.0)
MCH: 29.4 pg (ref 26.0–34.0)
MCHC: 32.8 g/dL (ref 30.0–36.0)
MCV: 89.6 fL (ref 80.0–100.0)
Platelets: 236 10*3/uL (ref 150–400)
RBC: 3.64 MIL/uL — ABNORMAL LOW (ref 3.87–5.11)
RDW: 14.6 % (ref 11.5–15.5)
WBC: 6.2 10*3/uL (ref 4.0–10.5)
nRBC: 0 % (ref 0.0–0.2)

## 2021-03-09 LAB — BASIC METABOLIC PANEL
Anion gap: 8 (ref 5–15)
BUN: 21 mg/dL (ref 8–23)
CO2: 25 mmol/L (ref 22–32)
Calcium: 8.9 mg/dL (ref 8.9–10.3)
Chloride: 103 mmol/L (ref 98–111)
Creatinine, Ser: 0.69 mg/dL (ref 0.44–1.00)
GFR, Estimated: >60 mL/min (ref >60)
Glucose, Bld: 111 mg/dL — ABNORMAL HIGH (ref 70–99)
Potassium: 4 mmol/L (ref 3.5–5.1)
Sodium: 136 mmol/L (ref 135–145)

## 2021-03-09 LAB — PHOSPHORUS: Phosphorus: 3.3 mg/dL (ref 2.5–4.6)

## 2021-03-09 LAB — RESP PANEL BY RT-PCR (FLU A&B, COVID) ARPGX2
Influenza A by PCR: NEGATIVE
Influenza B by PCR: NEGATIVE
SARS Coronavirus 2 by RT PCR: NEGATIVE

## 2021-03-09 LAB — GLUCOSE, CAPILLARY
Glucose-Capillary: 110 mg/dL — ABNORMAL HIGH (ref 70–99)
Glucose-Capillary: 96 mg/dL (ref 70–99)
Glucose-Capillary: 127 mg/dL — ABNORMAL HIGH (ref 70–99)
Glucose-Capillary: 97 mg/dL (ref 70–99)
Glucose-Capillary: 201 mg/dL — ABNORMAL HIGH (ref 70–99)

## 2021-03-09 LAB — HEMOGLOBIN A1C
Hgb A1c MFr Bld: 6.7 % — ABNORMAL HIGH (ref 4.8–5.6)
Mean Plasma Glucose: 145.59 mg/dL

## 2021-03-09 LAB — TSH: TSH: 1.95 u[IU]/mL (ref 0.350–4.500)

## 2021-03-09 LAB — VITAMIN D 25 HYDROXY (VIT D DEFICIENCY, FRACTURES): Vit D, 25-Hydroxy: 59.95 ng/mL (ref 30–100)

## 2021-03-09 LAB — MAGNESIUM: Magnesium: 1.9 mg/dL (ref 1.7–2.4)

## 2021-03-09 MED ORDER — MORPHINE SULFATE (PF) 2 MG/ML IV SOLN
2.0000 mg | INTRAVENOUS | Status: DC | PRN
  Filled 2021-03-09: qty 1

## 2021-03-09 MED ORDER — METOPROLOL TARTRATE 5 MG/5ML IV SOLN
2.5000 mg | Freq: Four times a day (QID) | INTRAVENOUS | Status: DC | PRN
  Administered 2021-03-09: 2.5 mg via INTRAVENOUS
  Filled 2021-03-09: qty 5

## 2021-03-09 MED ORDER — NALOXONE HCL 0.4 MG/ML IJ SOLN: 0.4000 mg | INTRAMUSCULAR | Status: DC | PRN

## 2021-03-09 NOTE — Progress Notes (Addendum)
PROGRESS NOTE  CHARNAY NAZARIO ELF:[id number] DOB: Mar 04, 1923 DOA: 03/08/2021 PCP: Chilton Greathouse, MD  HPI/Recap of past 27 hours: 87 year old female who was admitted after twisted her leg and sustained a bicondyle tibial plateau fracture.  She is from assisted living facility. She has been evaluated by physical medicine and they do not think she meets criteria for CIR.  Assessment/Plan: Active Problems:   Tibial plateau fracture #1 right nondisplaced bicondylar tibial plateau fracture.  Patient has been seen and evaluated by orthopedic and they recommend nonweightbearing she is not a surgical candidate.  Physical therapy has evaluated.  Patient will be kept on nonweightbearing with a mild knee immobilizer of the right Will need to be discharged back to her assisted living facility for skilled physical therapy.  2.  History of CVA  we will continue aspirin and Plavix and Crestor from home  3.  Dementia continue donepezil  4.  2 diabetes mellitus stable  5.  Hypertension slightly uncontrolled  we will continue her home dose of amlodipine and Avapro dexterously restarted  6.  Chronic diastolic CHF Her last 2D echo done on October 01, 2019 revealed ejection fraction of 70 to 75% There is no issue with this she is euvolemic we will continue to closely monitor her volume status continue on home medication strict I&O  7.  Iron deficiency anemia Hemoglobin is at baseline Continue ferrous sulfate supplement Osteopenia.  Her vitamin D3 is normal Patient was started on D3 with Os-Cal   Code Status: Full  Severity of Illness: The appropriate patient status for this patient is OBSERVATION. Observation status is judged to be reasonable and necessary in order to provide the required intensity of service to ensure the patient's safety. The patient's presenting symptoms, physical exam findings, and initial radiographic and laboratory data in the context of their medical condition is felt  to place them at decreased risk for further clinical deterioration. Furthermore, it is anticipated that the patient will be medically stable for discharge from the hospital within 2 midnights of admission. The following factors support the patient status of observation.   "  Family Communication: Spoke with 2 of her sons SID who was in the room and [facility] the nephrologist ,who was on the phone  Disposition Plan: Discharge back to assisted living facility pt will benefit from skilled PT to increase their independence and safety with mobility when mobility restriction is removed.  Likely discharge in the morning Consultants: Orthopedic  Procedures: None  Antimicrobials: None  DVT prophylaxis: Lovenox   Objective: Vitals:   03/09/21 0550 03/09/21 0700 03/09/21 0737 03/09/21 0739  BP: (!) 173/72  (!) 187/77   Pulse: 72  74 69  Resp:    17  Temp:  98.8 F (37.1 C) 98.8 F (37.1 C)   TempSrc:   Oral   SpO2:   98% 99%  Weight:      Height:       No intake or output data in the 24 hours ending 03/09/21 0925 Filed Weights   03/08/21 1706  Weight: 54.9 kg   Body mass index is 21.44 kg/m.  Exam:  General: 87 y.o. year-old female well developed well nourished in no acute distress.  Alert and oriented x3. Cardiovascular: Regular rate and rhythm with no rubs or gallops.  No thyromegaly or JVD noted.   Respiratory: Clear to auscultation with no wheezes or rales. Good inspiratory effort. Abdomen: Soft nontender nondistended with normal bowel sounds x4 quadrants. Musculoskeletal: No lower extremity edema.  2/4 pulses in all 4 extremities. Skin: No ulcerative lesions noted or rashes, Psychiatry: Mood is appropriate for condition and setting    Data Reviewed: CBC: Recent Labs  Lab 03/08/21 1712 03/09/21 0424  WBC 7.4 6.2  NEUTROABS 5.6  --   HGB 11.0* 10.7*  HCT 33.3* 32.6*  MCV 89.0 89.6  PLT 275 236   Basic Metabolic Panel: Recent Labs  Lab 03/08/21 1712  03/09/21 0424  NA 135 136  K 4.0 4.0  CL 100 103  CO2 26 25  GLUCOSE 195* 111*  BUN 23 21  CREATININE 0.77 0.69  CALCIUM 9.2 8.9  MG  --  1.9  PHOS  --  3.3   GFR: Estimated Creatinine Clearance: 34 mL/min (by C-G formula based on SCr of 0.69 mg/dL). Liver Function Tests: No results for input(s): AST, ALT, ALKPHOS, BILITOT, PROT, ALBUMIN in the last 168 hours. No results for input(s): LIPASE, AMYLASE in the last 168 hours. No results for input(s): AMMONIA in the last 168 hours. Coagulation Profile: No results for input(s): INR, PROTIME in the last 168 hours. Cardiac Enzymes: No results for input(s): CKTOTAL, CKMB, CKMBINDEX, TROPONINI in the last 168 hours. BNP (last 3 results) No results for input(s): PROBNP in the last 8760 hours. HbA1C: Recent Labs    03/09/21 0424  HGBA1C 6.7*   CBG: Recent Labs  Lab 03/08/21 2257 03/09/21 0141 03/09/21 0625  GLUCAP 177* 110* 96   Lipid Profile: No results for input(s): CHOL, HDL, LDLCALC, TRIG, CHOLHDL, LDLDIRECT in the last 72 hours. Thyroid Function Tests: Recent Labs    03/09/21 0424  TSH 1.950   Anemia Panel: No results for input(s): VITAMINB12, FOLATE, FERRITIN, TIBC, IRON, RETICCTPCT in the last 72 hours. Urine analysis:    Component Value Date/Time   COLORURINE YELLOW 09/27/2019 1924   APPEARANCEUR CLEAR 09/27/2019 1924   LABSPEC 1.017 09/27/2019 1924   PHURINE 6.0 09/27/2019 1924   GLUCOSEU >=500 (A) 09/27/2019 1924   HGBUR NEGATIVE 09/27/2019 1924   BILIRUBINUR NEGATIVE 09/27/2019 1924   KETONESUR 5 (A) 09/27/2019 1924   PROTEINUR 30 (A) 09/27/2019 1924   NITRITE NEGATIVE 09/27/2019 1924   LEUKOCYTESUR NEGATIVE 09/27/2019 1924   Sepsis Labs: @LABRCNTIP (procalcitonin:4,lacticidven:4)  ) Recent Results (from the past 240 hour(s))  Resp Panel by RT-PCR (Flu A&B, Covid) Nasopharyngeal Swab     Status: None   Collection Time: 03/08/21 11:04 PM   Specimen: Nasopharyngeal Swab; Nasopharyngeal(NP) swabs in  vial transport medium  Result Value Ref Range Status   SARS Coronavirus 2 by RT PCR NEGATIVE NEGATIVE Final    Comment: (NOTE) SARS-CoV-2 target nucleic acids are NOT DETECTED.  The SARS-CoV-2 RNA is generally detectable in upper respiratory specimens during the acute phase of infection. The lowest concentration of SARS-CoV-2 viral copies this assay can detect is 138 copies/mL. A negative result does not preclude SARS-Cov-2 infection and should not be used as the sole basis for treatment or other patient management decisions. A negative result may occur with  improper specimen collection/handling, submission of specimen other than nasopharyngeal swab, presence of viral mutation(s) within the areas targeted by this assay, and inadequate number of viral copies(<138 copies/mL). A negative result must be combined with clinical observations, patient history, and epidemiological information. The expected result is Negative.  Fact Sheet for Patients:  03/10/21  Fact Sheet for Healthcare Providers:  BloggerCourse.com  This test is no t yet approved or cleared by the [place] FDA and  has been authorized for detection and/or diagnosis  of SARS-CoV-2 by FDA under an Emergency Use Authorization (EUA). This EUA will remain  in effect (meaning this test can be used) for the duration of the COVID-19 declaration under Section 564(b)(1) of the Act, 21 U.S.C.section 360bbb-3(b)(1), unless the authorization is terminated  or revoked sooner.       Influenza A by PCR NEGATIVE NEGATIVE Final   Influenza B by PCR NEGATIVE NEGATIVE Final    Comment: (NOTE) The Xpert Xpress SARS-CoV-2/FLU/RSV plus assay is intended as an aid in the diagnosis of influenza from Nasopharyngeal swab specimens and should not be used as a sole basis for treatment. Nasal washings and aspirates are unacceptable for Xpert Xpress SARS-CoV-2/FLU/RSV testing.  Fact  Sheet for Patients: BloggerCourse.comhttps://www.fda.gov/media/152166/download  Fact Sheet for Healthcare Providers: SeriousBroker.ithttps://www.fda.gov/media/152162/download  This test is not yet approved or cleared by the [place] FDA and has been authorized for detection and/or diagnosis of SARS-CoV-2 by FDA under an Emergency Use Authorization (EUA). This EUA will remain in effect (meaning this test can be used) for the duration of the COVID-19 declaration under Section 564(b)(1) of the Act, 21 U.S.C. section 360bbb-3(b)(1), unless the authorization is terminated or revoked.  Performed at [facility], [address] [place], [place] [address]       Studies: CT Knee Right Wo Contrast  Result Date: 03/08/2021 CLINICAL DATA:  Right knee pain and swelling after twisting injury yesterday. EXAM: CT OF THE RIGHT KNEE WITHOUT CONTRAST TECHNIQUE: Multidetector CT imaging of the right knee was performed according to the standard protocol. Multiplanar CT image reconstructions were also generated. COMPARISON:  Right knee x-rays from same day. FINDINGS: Bones/Joint/Cartilage Acute minimally displaced fracture of the posterior lateral tibial plateau. Acute minimally depressed fracture of the posterior medial tibial plateau with associated nondisplaced longitudinal component involving the posterior metaphyseal cortex (series 6, image 51). No dislocation. Moderate to severe medial compartment joint space narrowing with subchondral sclerosis and cystic change. Small tricompartmental marginal osteophytes. Osteopenia. Large lipohemarthrosis. Ligaments Ligaments are suboptimally evaluated by CT. Muscles and Tendons Grossly intact. Soft tissue No fluid collection or hematoma.  No soft tissue mass. IMPRESSION: 1. Acute bicondylar tibial plateau fracture as described above. 2. Large lipohemarthrosis. 3. Moderate to severe medial compartment osteoarthritis. Electronically Signed   By: Obie DredgeWilliam T Derry M.D.   On: 03/08/2021 21:25    DG CHEST PORT 1 VIEW  Result Date: 03/08/2021 CLINICAL DATA:  Tachypnea EXAM: PORTABLE CHEST 1 VIEW COMPARISON:  10/03/2019 FINDINGS: Single frontal view of the chest demonstrates an unremarkable cardiac silhouette. No acute airspace disease, effusion, or pneumothorax. No acute bony abnormalities. Prominent skin folds overlie the right chest. IMPRESSION: 1. No acute intrathoracic process. Electronically Signed   By: Sharlet SalinaMichael  Brown M.D.   On: 03/08/2021 23:27   DG Knee Complete 4 Views Right  Result Date: 03/08/2021 CLINICAL DATA:  Knee pain following twisting. EXAM: RIGHT KNEE - COMPLETE 4+ VIEW COMPARISON:  None FINDINGS: Osteopenia. Subtle lucency in the lateral tibial plateau seen on the oblique view. No sign of frank displaced fracture. Mild irregularity along the posterior aspect of the tibial plateau as well w. degenerative changes greatest in the medial compartment. Signs of vascular calcification in the soft tissues. Moderate joint effusion.  This is in the suprapatellar recess. IMPRESSION: 1. Moderate joint effusion. 2. Question nondisplaced injury to the medial tibial plateau. Electronically Signed   By: Donzetta KohutGeoffrey  Wile M.D.   On: 03/08/2021 18:29    Scheduled Meds:  amLODipine  5 mg Oral Daily   And  irbesartan  300 mg Oral Daily   aspirin  81 mg Oral Daily   budesonide  0.5 mg Inhalation BID   calcium-vitamin D  1 tablet Oral Q breakfast   cholecalciferol  1,000 Units Oral Daily   clopidogrel  75 mg Oral Daily   docusate sodium  100 mg Oral Daily   donepezil  10 mg Oral QHS   enoxaparin (LOVENOX) injection  40 mg Subcutaneous QHS   escitalopram  5 mg Oral Daily   ferrous sulfate  650 mg Oral Daily   insulin aspart  0-5 Units Subcutaneous QHS   insulin aspart  0-9 Units Subcutaneous TID WC   ketorolac  15 mg Intravenous Q8H   metoprolol succinate  25 mg Oral Daily   mirabegron ER  50 mg Oral Daily   multivitamin with minerals  1 tablet Oral Daily   psyllium  1 packet Oral  Daily   rosuvastatin  5 mg Oral Daily    Continuous Infusions:   LOS: 0 days     Myrtie Neither, MD [facility]  To reach me or the doctor on call, go to: [email or web address] Password [email or web address]  03/09/2021, 9:25 AM

## 2021-03-09 NOTE — Progress Notes (Signed)
Orthopedic Tech Progress Note Patient Details:  [name] [birth year] [id number] 24 inch Knee Immobilizer was placed on patient's right leg for full immobilization  Ortho Devices Type of Ortho Device: Knee Immobilizer Ortho Device/Splint Location: Right Leg Ortho Device/Splint Interventions: Application   Post Interventions Patient Tolerated: Well Instructions Provided: Adjustment of device, Care of device, Poper ambulation with device  [name] 03/09/2021, 10:04 AM

## 2021-03-09 NOTE — Evaluation (Signed)
Physical Therapy Evaluation Patient Details Name: [name] MRN: [id number] DOB: Nov 14, 1922 Today's Date: 03/09/2021   History of Present Illness  87 year old female who was trying to get to her birthday party when she twisted her knee and felt a sudden pain and swelling.  She was evaluated in the emergency department and found to have a nondisplaced bicondylar tibial plateau fracture. No surgical managment is recommended by MD at this time. Pt to wear knee immobilizer and NWB   Clinical Impression  Pt admitted with above diagnosis. PTA pt resided at [facility], mod I mobility with rollator. On eval, she required +2 max/total assist bed mobility, +2 max/total assist sit to stand with RW, and total assist +2 safety/equipment squat pivot transfer (into chair without arms). Pt currently with functional limitations due to the deficits listed below (see PT Problem List). Pt will benefit from skilled PT to increase their independence and safety with mobility to allow discharge to the venue listed below.  Pt is very motivated and compliant with participation in therapy. Good activity tolerance. She shows good potential to progress at wheelchair level until RLE WB status increases. When increased WB is allowed, pt demonstrates good potential for return to independent ambulator with RW.     Follow Up Recommendations CIR    Equipment Recommendations  Wheelchair (measurements PT);3in1 (PT);Wheelchair cushion (measurements PT) (drop arm 3n1)    Recommendations for Other Services Rehab consult     Precautions / Restrictions Precautions Precautions: Fall Required Braces or Orthoses: Knee Immobilizer - Right Knee Immobilizer - Right: On at all times Restrictions Weight Bearing Restrictions: Yes RLE Weight Bearing: Non weight bearing      Mobility  Bed Mobility Overal bed mobility: Needs Assistance Bed Mobility: Supine to Sit;Sit to Supine     Supine to sit: Max assist;HOB  elevated Sit to supine: +2 for physical assistance;Total assist   General bed mobility comments: cues for sequencing, increased time, assist for all aspects of mobility    Transfers Overall transfer level: Needs assistance Equipment used: Rolling walker (2 wheeled) Transfers: Sit to/from [employer] Sit to Stand: Max assist;+2 physical assistance Stand pivot transfers: Total assist;+2 physical assistance Squat pivot transfers: +2 safety/equipment;Total assist     General transfer comment: sit to stand with RW, RW removed and squat pivot transfer performed bed <> chair without armrests. Pivot transfer toward L.  Ambulation/Gait             General Gait Details: unable  Stairs            Wheelchair Mobility    Modified Rankin (Stroke Patients Only)       Balance Overall balance assessment: Needs assistance Sitting-balance support: No upper extremity supported;Feet supported Sitting balance-Leahy Scale: Fair     Standing balance support: Bilateral upper extremity supported;During functional activity Standing balance-Leahy Scale: Zero Standing balance comment: total reliance on external support due to RLE NWB                             Pertinent Vitals/Pain Pain Assessment: Faces Faces Pain Scale: Hurts whole lot Pain Location: RLE during mobility Pain Descriptors / Indicators: Guarding;Grimacing;Moaning Pain Intervention(s): Limited activity within patient's tolerance;Repositioned;Monitored during session;Patient requesting pain meds-RN notified    Home Living Family/patient expects to be discharged to:: Assisted living               Home Equipment: Walker - 4 wheels;Shower seat;Grab bars -  toilet;Grab bars - tub/shower;Bedside commode      Prior Function Level of Independence: Independent with assistive device(s)   Gait / Transfers Assistance Needed: uses rollater for mobility  ADL's / Homemaking Assistance Needed:  Pt has assist for LB ADLs selfcare, showers. Ind with grooming, UB ADLs and toileting        Hand Dominance   Dominant Hand: Right    Extremity/Trunk Assessment   Upper Extremity Assessment Upper Extremity Assessment: Defer to OT evaluation    Lower Extremity Assessment Lower Extremity Assessment: RLE deficits/detail RLE Deficits / Details: knee immobilizer in place RLE: Unable to fully assess due to pain;Unable to fully assess due to immobilization    Cervical / Trunk Assessment Cervical / Trunk Assessment: Kyphotic  Communication   Communication: Prefers language other than English;Other (comment);HOH (Pt's son present to help with communication. Pt speaks English well but is very HOH.)  Cognition Arousal/Alertness: Awake/alert Behavior During Therapy: WFL for tasks assessed/performed Overall Cognitive Status: Within Functional Limits for tasks assessed                                 General Comments: HOH      General Comments General comments (skin integrity, edema, etc.): VSS on RA    Exercises     Assessment/Plan    PT Assessment Patient needs continued PT services  PT Problem List Decreased mobility;Decreased knowledge of precautions;Decreased activity tolerance;Pain;Decreased balance;Decreased knowledge of use of DME       PT Treatment Interventions DME instruction;Therapeutic activities;Therapeutic exercise;Gait training;Patient/family education;Balance training;Functional mobility training    PT Goals (Current goals can be found in the Care Plan section)  Acute Rehab PT Goals Patient Stated Goal: to walk again PT Goal Formulation: With patient/family Time For Goal Achievement: 03/23/21 Potential to Achieve Goals: Good    Frequency Min 5X/week   Barriers to discharge        Co-evaluation               AM-PAC PT "6 Clicks" Mobility  Outcome Measure Help needed turning from your back to your side while in a flat bed without  using bedrails?: A Lot Help needed moving from lying on your back to sitting on the side of a flat bed without using bedrails?: Total Help needed moving to and from a bed to a chair (including a wheelchair)?: Total Help needed standing up from a chair using your arms (e.g., wheelchair or bedside chair)?: Total Help needed to walk in hospital room?: Total Help needed climbing 3-5 steps with a railing? : Total 6 Click Score: 7    End of Session Equipment Utilized During Treatment: Gait belt;Right knee immobilizer Activity Tolerance: Patient tolerated treatment well Patient left: in bed;with call bell/phone within reach;with bed alarm set;with family/visitor present Nurse Communication: Mobility status;Weight bearing status;Patient requests pain meds PT Visit Diagnosis: Other abnormalities of gait and mobility (R26.89);Pain Pain - Right/Left: Right Pain - part of body: Knee    Time: 0952-1030 PT Time Calculation (min) (ACUTE ONLY): 38 min   Charges:   PT Evaluation $PT Eval Moderate Complexity: 1 Mod PT Treatments $Therapeutic Activity: 23-37 mins        Aida Raider, PT  Office # [phone] Pager [phone]   Ilda Foil 03/09/2021, 2:24 PM

## 2021-03-09 NOTE — Consult Note (Signed)
Reason for Consult:evaluate right knee injury Referring Physician: Dr. Aura DialsHall  [name] is an 87 y.o. female.  HPI: 87 year old female who was trying to get to her birthday party when she twisted her knee and felt a sudden pain and swelling.  She was evaluated in the emergency department and found to have a nondisplaced bicondylar tibial plateau fracture.  She denies other injuries.  Pain is manageable at this point in bed.  Past Medical History:  Diagnosis Date   Diabetes (HCC)    Hearing loss    Thalamic infarction [facility](HCC)     Past Surgical History:  Procedure Laterality Date   fractured rib      Family History  Problem Relation Age of Onset   Cancer Mother    Heart disease Sister    Diabetes Other    Stroke Other     Social History:  reports that she has never smoked. She has never used smokeless tobacco. She reports that she does not drink alcohol and does not use drugs.  Allergies:  Allergies  Allergen Reactions   Tussionex Pennkinetic Er [Hydrocod Polst-Cpm Polst Er] Other (See Comments)    Lethargy- Had to reverse hydrocodone with Narcan.   Benzodiazepines Other (See Comments)    Family reports high sensitivity   Sulfonamide Derivatives Other (See Comments)    Nervous, agitation   Tricyclic Antidepressants Other (See Comments)    Family reports high sensitivity    Medications: I have reviewed the patient's current medications.  Results for orders placed or performed during the hospital encounter of 03/08/21 (from the past 48 hour(s))  Basic metabolic panel     Status: Abnormal   Collection Time: 03/08/21  5:12 PM  Result Value Ref Range   Sodium 135 135 - 145 mmol/L   Potassium 4.0 3.5 - 5.1 mmol/L   Chloride 100 98 - 111 mmol/L   CO2 26 22 - 32 mmol/L   Glucose, Bld 195 (H) 70 - 99 mg/dL    Comment: Glucose reference range applies only to samples taken after fasting for at least 8 hours.   BUN 23 8 - 23 mg/dL   Creatinine, Ser 4.090.77 0.44 - 1.00 mg/dL    Calcium 9.2 8.9 - 81.110.3 mg/dL   GFR, Estimated >91>60 >47>60 mL/min    Comment: (NOTE) Calculated using the CKD-EPI Creatinine Equation (2021)    Anion gap 9 5 - 15    Comment: Performed at [facility], [address] [place], [place] [id number]  CBC with Differential     Status: Abnormal   Collection Time: 03/08/21  5:12 PM  Result Value Ref Range   WBC 7.4 4.0 - 10.5 K/uL   RBC 3.74 (L) 3.87 - 5.11 MIL/uL   Hemoglobin 11.0 (L) 12.0 - 15.0 g/dL   HCT 21.333.3 (L) 08.636.0 - 57.846.0 %   MCV 89.0 80.0 - 100.0 fL   MCH 29.4 26.0 - 34.0 pg   MCHC 33.0 30.0 - 36.0 g/dL   RDW 46.914.6 62.911.5 - 52.815.5 %   Platelets 275 150 - 400 K/uL   nRBC 0.0 0.0 - 0.2 %   Neutrophils Relative % 77 %   Neutro Abs 5.6 1.7 - 7.7 K/uL   Lymphocytes Relative 12 %   Lymphs Abs 0.9 0.7 - 4.0 K/uL   Monocytes Relative 11 %   Monocytes Absolute 0.8 0.1 - 1.0 K/uL   Eosinophils Relative 0 %   Eosinophils Absolute 0.0 0.0 - 0.5 K/uL   Basophils  Relative 0 %   Basophils Absolute 0.0 0.0 - 0.1 K/uL   Immature Granulocytes 0 %   Abs Immature Granulocytes 0.03 0.00 - 0.07 K/uL    Comment: Performed at [facility], [address] [place], [place] [address]  CBG monitoring, ED     Status: Abnormal   Collection Time: 03/08/21 10:57 PM  Result Value Ref Range   Glucose-Capillary 177 (H) 70 - 99 mg/dL    Comment: Glucose reference range applies only to samples taken after fasting for at least 8 hours.  Resp Panel by RT-PCR (Flu A&B, Covid) Nasopharyngeal Swab     Status: None   Collection Time: 03/08/21 11:04 PM   Specimen: Nasopharyngeal Swab; Nasopharyngeal(NP) swabs in vial transport medium  Result Value Ref Range   SARS Coronavirus 2 by RT PCR NEGATIVE NEGATIVE    Comment: (NOTE) SARS-CoV-2 target nucleic acids are NOT DETECTED.  The SARS-CoV-2 RNA is generally detectable in upper respiratory specimens during the acute phase of infection. The lowest concentration of SARS-CoV-2 viral copies this assay can detect  is 138 copies/mL. A negative result does not preclude SARS-Cov-2 infection and should not be used as the sole basis for treatment or other patient management decisions. A negative result may occur with  improper specimen collection/handling, submission of specimen other than nasopharyngeal swab, presence of viral mutation(s) within the areas targeted by this assay, and inadequate number of viral copies(<138 copies/mL). A negative result must be combined with clinical observations, patient history, and epidemiological information. The expected result is Negative.  Fact Sheet for Patients:  BloggerCourse.com  Fact Sheet for Healthcare Providers:  SeriousBroker.it  This test is no t yet approved or cleared by the [place] FDA and  has been authorized for detection and/or diagnosis of SARS-CoV-2 by FDA under an Emergency Use Authorization (EUA). This EUA will remain  in effect (meaning this test can be used) for the duration of the COVID-19 declaration under Section 564(b)(1) of the Act, 21 U.S.C.section 360bbb-3(b)(1), unless the authorization is terminated  or revoked sooner.       Influenza A by PCR NEGATIVE NEGATIVE   Influenza B by PCR NEGATIVE NEGATIVE    Comment: (NOTE) The Xpert Xpress SARS-CoV-2/FLU/RSV plus assay is intended as an aid in the diagnosis of influenza from Nasopharyngeal swab specimens and should not be used as a sole basis for treatment. Nasal washings and aspirates are unacceptable for Xpert Xpress SARS-CoV-2/FLU/RSV testing.  Fact Sheet for Patients: BloggerCourse.com  Fact Sheet for Healthcare Providers: SeriousBroker.it  This test is not yet approved or cleared by the [place] FDA and has been authorized for detection and/or diagnosis of SARS-CoV-2 by FDA under an Emergency Use Authorization (EUA). This EUA will remain in effect (meaning this  test can be used) for the duration of the COVID-19 declaration under Section 564(b)(1) of the Act, 21 U.S.C. section 360bbb-3(b)(1), unless the authorization is terminated or revoked.  Performed at [facility], [address] [place], [place] [address]   Glucose, capillary     Status: Abnormal   Collection Time: 03/09/21  1:41 AM  Result Value Ref Range   Glucose-Capillary 110 (H) 70 - 99 mg/dL    Comment: Glucose reference range applies only to samples taken after fasting for at least 8 hours.  Hemoglobin A1c     Status: Abnormal   Collection Time: 03/09/21  4:24 AM  Result Value Ref Range   Hgb A1c MFr Bld 6.7 (H) 4.8 - 5.6 %  Comment: (NOTE) Pre diabetes:          5.7%-6.4%  Diabetes:              >6.4%  Glycemic control for   <7.0% adults with diabetes    Mean Plasma Glucose 145.59 mg/dL    Comment: Performed at [facility], [address] [place], [place] [address]  VITAMIN D 25 Hydroxy (Vit-D Deficiency, Fractures)     Status: None   Collection Time: 03/09/21  4:24 AM  Result Value Ref Range   Vit D, 25-Hydroxy 59.95 30 - 100 ng/mL    Comment: (NOTE) Vitamin D deficiency has been defined by the Institute of Medicine  and an Endocrine Society practice guideline as a level of serum 25-OH  vitamin D less than 20 ng/mL (1,2). The Endocrine Society went on to  further define vitamin D insufficiency as a level between 21 and 29  ng/mL (2).  1. IOM (Institute of Medicine). 2010. Dietary reference intakes for  calcium and D. [place]: [employer]. 2. Holick MF, Binkley [place], Bischoff-Ferrari HA, et al. Evaluation,  treatment, and prevention of vitamin D deficiency: an Endocrine  Society clinical practice guideline, JCEM. 2011 Jul; 96(7): 1911-30.  Performed at [facility], [address] [place], [place] [address]   Basic metabolic panel     Status: Abnormal   Collection Time: 03/09/21  4:24 AM  Result Value Ref Range    Sodium 136 135 - 145 mmol/L   Potassium 4.0 3.5 - 5.1 mmol/L   Chloride 103 98 - 111 mmol/L   CO2 25 22 - 32 mmol/L   Glucose, Bld 111 (H) 70 - 99 mg/dL    Comment: Glucose reference range applies only to samples taken after fasting for at least 8 hours.   BUN 21 8 - 23 mg/dL   Creatinine, Ser 5.36 0.44 - 1.00 mg/dL   Calcium 8.9 8.9 - 46.8 mg/dL   GFR, Estimated >03 >21 mL/min    Comment: (NOTE) Calculated using the CKD-EPI Creatinine Equation (2021)    Anion gap 8 5 - 15    Comment: Performed at [facility], [address], [place], [place] [address]  Magnesium     Status: None   Collection Time: 03/09/21  4:24 AM  Result Value Ref Range   Magnesium 1.9 1.7 - 2.4 mg/dL    Comment: Performed at [facility], [address] [place], [place] [address]  Phosphorus     Status: None   Collection Time: 03/09/21  4:24 AM  Result Value Ref Range   Phosphorus 3.3 2.5 - 4.6 mg/dL    Comment: Performed at [facility], [address], [place], [place] [address]  TSH     Status: None   Collection Time: 03/09/21  4:24 AM  Result Value Ref Range   TSH 1.950 0.350 - 4.500 uIU/mL    Comment: Performed by a 3rd Generation assay with a functional sensitivity of <=0.01 uIU/mL. Performed at [facility], [address], [place], [place] [address]   CBC     Status: Abnormal   Collection Time: 03/09/21  4:24 AM  Result Value Ref Range   WBC 6.2 4.0 - 10.5 K/uL   RBC 3.64 (L) 3.87 - 5.11 MIL/uL   Hemoglobin 10.7 (L) 12.0 - 15.0 g/dL   HCT 50.3 (L) 88.8 - 28.0 %   MCV 89.6 80.0 - 100.0 fL  MCH 29.4 26.0 - 34.0 pg   MCHC 32.8 30.0 - 36.0 g/dL   RDW 16.1 09.6 - 04.5 %   Platelets 236 150 - 400 K/uL   nRBC 0.0 0.0 - 0.2 %    Comment: Performed at [facility], [address], [place], [place] [address]  Glucose, capillary     Status: None   Collection Time: 03/09/21  6:25 AM  Result Value Ref Range   Glucose-Capillary 96 70 - 99 mg/dL    Comment: Glucose  reference range applies only to samples taken after fasting for at least 8 hours.    CT Knee Right Wo Contrast  Result Date: 03/08/2021 CLINICAL DATA:  Right knee pain and swelling after twisting injury yesterday. EXAM: CT OF THE RIGHT KNEE WITHOUT CONTRAST TECHNIQUE: Multidetector CT imaging of the right knee was performed according to the standard protocol. Multiplanar CT image reconstructions were also generated. COMPARISON:  Right knee x-rays from same day. FINDINGS: Bones/Joint/Cartilage Acute minimally displaced fracture of the posterior lateral tibial plateau. Acute minimally depressed fracture of the posterior medial tibial plateau with associated nondisplaced longitudinal component involving the posterior metaphyseal cortex (series 6, image 51). No dislocation. Moderate to severe medial compartment joint space narrowing with subchondral sclerosis and cystic change. Small tricompartmental marginal osteophytes. Osteopenia. Large lipohemarthrosis. Ligaments Ligaments are suboptimally evaluated by CT. Muscles and Tendons Grossly intact. Soft tissue No fluid collection or hematoma.  No soft tissue mass. IMPRESSION: 1. Acute bicondylar tibial plateau fracture as described above. 2. Large lipohemarthrosis. 3. Moderate to severe medial compartment osteoarthritis. Electronically Signed   By: Obie Dredge M.D.   On: 03/08/2021 21:25   DG CHEST PORT 1 VIEW  Result Date: 03/08/2021 CLINICAL DATA:  Tachypnea EXAM: PORTABLE CHEST 1 VIEW COMPARISON:  10/03/2019 FINDINGS: Single frontal view of the chest demonstrates an unremarkable cardiac silhouette. No acute airspace disease, effusion, or pneumothorax. No acute bony abnormalities. Prominent skin folds overlie the right chest. IMPRESSION: 1. No acute intrathoracic process. Electronically Signed   By: Sharlet Salina M.D.   On: 03/08/2021 23:27   DG Knee Complete 4 Views Right  Result Date: 03/08/2021 CLINICAL DATA:  Knee pain following twisting. EXAM:  RIGHT KNEE - COMPLETE 4+ VIEW COMPARISON:  None FINDINGS: Osteopenia. Subtle lucency in the lateral tibial plateau seen on the oblique view. No sign of frank displaced fracture. Mild irregularity along the posterior aspect of the tibial plateau as well w. degenerative changes greatest in the medial compartment. Signs of vascular calcification in the soft tissues. Moderate joint effusion.  This is in the suprapatellar recess. IMPRESSION: 1. Moderate joint effusion. 2. Question nondisplaced injury to the medial tibial plateau. Electronically Signed   By: Donzetta Kohut M.D.   On: 03/08/2021 18:29    Review of Systems  All other systems reviewed and are negative. Blood pressure (!) 187/77, pulse 69, temperature 98.8 F (37.1 C), temperature source Oral, resp. rate 17, height  (1.6 m), weight 54.9 kg, SpO2 99 %. Physical Exam HENT:     Head: Atraumatic.  Eyes:     Extraocular Movements: Extraocular movements intact.  Cardiovascular:     Pulses: Normal pulses.  Pulmonary:     Effort: Pulmonary effort is normal.  Musculoskeletal:     Comments: Right lower extremity has a knee immobilizer intact.  The skin around the knee is intact with moderate to large effusion and tenderness about the proximal tibia.  Distally neurovascularly intact.  No calf tenderness or swelling.  Skin:    General: Skin is warm.  Neurological:     Mental Status: She is alert.  Psychiatric:        Mood and Affect: Mood normal.    Assessment/Plan: Right nondisplaced bicondylar tibial plateau fracture in a 87 year old female We will keep her in a knee immobilizer nonweightbearing to allow this to heal.  This should do well without surgical management as long as we can keep her off of it. She will follow-up with me in 2-3 weeks for repeat x-rays in the office to ensure stabilityand appropriate healing.  [name] 03/09/2021, 8:47 AM

## 2021-03-09 NOTE — Progress Notes (Signed)
PT Cancellation Note  Patient Details Name: TYIESHA BRACKNEY MRN: [id number] DOB: 1923/05/24   Cancelled Treatment:    Reason Eval/Treat Not Completed: Patient not medically ready. Orders received for PT eval. Will await ortho consult prior to completing eval.   Ilda Foil 03/09/2021, 7:43 AM  Aida Raider, PT  Office # [phone] Pager [phone]

## 2021-03-09 NOTE — ED Notes (Signed)
Report attempted 

## 2021-03-09 NOTE — Evaluation (Signed)
Occupational Therapy Evaluation Patient Details Name: [name] MRN: [id number] DOB: Apr 20, 1923 Today's Date: 03/09/2021    History of Present Illness 87 year old female who was trying to get to her birthday party when she twisted her knee and felt a sudden pain and swelling.  She was evaluated in the emergency department and found to have a nondisplaced bicondylar tibial plateau fracture. No surgical managment is recommended by MD at this time. Pt to wear knee immobilizer and NWB   Clinical Impression   Pt presents with decline in function and safety with ADLs and ADL mobility with impaired strength, balance and endurance. PTA pt lived at [facility] and was Ind with UB ADLs, toileting, grooming, used a rollater for mobility; has assist with LB ADLs and showers. Pt currently requires max A with bed mobility to sit EOB, min A with UB ADLs, max - total A with LB ADLs, total A with toileting, max - total A + 2 for sit - stand, SPTs to [facility]. Pt would benefit from acute OT services to address impairments to maximize level of function and safety    Follow Up Recommendations  CIR    Equipment Recommendations  Other (comment) (TBD at next venue of care)    Recommendations for Other Services Rehab consult     Precautions / Restrictions Precautions Precautions: Fall Required Braces or Orthoses: Knee Immobilizer - Right Knee Immobilizer - Right: On at all times Restrictions Weight Bearing Restrictions: Yes RLE Weight Bearing: Non weight bearing      Mobility Bed Mobility Overal bed mobility: Needs Assistance Bed Mobility: Supine to Sit;Sit to Supine     Supine to sit: Total assist Sit to supine: Total assist;+2 for physical assistance        Transfers Overall transfer level: Needs assistance Equipment used: Rolling walker (2 wheeled) Transfers: Sit to/from [employer] Sit to Stand: Max assist;+2 physical assistance Stand pivot transfers: Total  assist;+2 physical assistance       General transfer comment: max A +2 for sit - stand to RW x 3 trias. pt able to maintain R LE NWB, total A+2 for SPT to [facility] and back to bed, cues for hand placement    Balance Overall balance assessment: Needs assistance Sitting-balance support: No upper extremity supported;Feet supported Sitting balance-Leahy Scale: Fair     Standing balance support: Bilateral upper extremity supported;During functional activity Standing balance-Leahy Scale: Zero                             ADL either performed or assessed with clinical judgement   ADL Overall ADL's : Needs assistance/impaired Eating/Feeding: Set up;Sitting;Bed level   Grooming: Wash/dry hands;Wash/dry face;Min guard;Sitting   Upper Body Bathing: Minimal assistance;Sitting   Lower Body Bathing: Maximal assistance   Upper Body Dressing : Minimal assistance;Sitting   Lower Body Dressing: Total assistance   Toilet Transfer: Total assistance;+2 for physical assistance;Stand-pivot;BSC;Cueing for safety;Cueing for sequencing;RW   Toileting- Clothing Manipulation and Hygiene: Total assistance       Functional mobility during ADLs: Maximal assistance;Total assistance;+2 for physical assistance;Cueing for safety;Rolling walker;Cueing for sequencing       Vision Baseline Vision/History: Wears glasses Wears Glasses: At all times Patient Visual Report: No change from baseline       Perception     Praxis      Pertinent Vitals/Pain Pain Assessment: Faces Faces Pain Scale: Hurts even more Pain Descriptors / Indicators: Guarding;Grimacing;Moaning Pain Intervention(s): Limited  activity within patient's tolerance;Monitored during session;Premedicated before session;Repositioned     Hand Dominance     Extremity/Trunk Assessment Upper Extremity Assessment Upper Extremity Assessment: Generalized weakness   Lower Extremity Assessment Lower Extremity Assessment: Defer to PT  evaluation       Communication Communication Communication: Prefers language other than [place];Other (comment) (pt's son present to interpret, pt speaks [place])   Cognition Arousal/Alertness: Awake/alert Behavior During Therapy: WFL for tasks assessed/performed Overall Cognitive Status: Within Functional Limits for tasks assessed                                 General Comments: HOH   General Comments       Exercises     Shoulder Instructions      Home Living Family/patient expects to be discharged to:: Assisted living                             Home Equipment: Walker - 4 wheels;Shower seat;Grab bars - toilet;Grab bars - tub/shower;Bedside commode          Prior Functioning/Environment Level of Independence: Independent with assistive device(s)  Gait / Transfers Assistance Needed: uses rollater for mobility ADL's / Homemaking Assistance Needed: Pt has assist for LB ADLs selfcare, showers. Ind with grooming, sel fedding, UB ADLs and toileting            OT Problem List: Decreased strength;Impaired balance (sitting and/or standing);Pain;Decreased activity tolerance;Decreased knowledge of use of DME or AE      OT Treatment/Interventions: Self-care/ADL training;Therapeutic exercise;Patient/family education;Balance training;Therapeutic activities;DME and/or AE instruction    OT Goals(Current goals can be found in the care plan section) Acute Rehab OT Goals Patient Stated Goal: get better OT Goal Formulation: With patient/family Time For Goal Achievement: 03/23/21 Potential to Achieve Goals: Good ADL Goals Pt Will Perform Grooming: with supervision;with set-up;sitting Pt Will Perform Upper Body Bathing: with min guard assist;with supervision;with set-up;sitting Pt Will Perform Lower Body Bathing: with mod assist;sitting/lateral leans Pt Will Perform Upper Body Dressing: with min guard assist;with supervision;with set-up;sitting Pt Will  Transfer to Toilet: with max assist;with mod assist;stand pivot transfer;bedside commode Additional ADL Goal #1: Pt will complete bed mobility mod - min A to sit EOB in prep for ADL/selfcare tasks  OT Frequency: Min 2X/week   Barriers to D/C:            Co-evaluation              AM-PAC OT "6 Clicks" Daily Activity     Outcome Measure Help from another person eating meals?: None Help from another person taking care of personal grooming?: A Little Help from another person toileting, which includes using toliet, bedpan, or urinal?: Total Help from another person bathing (including washing, rinsing, drying)?: A Lot Help from another person to put on and taking off regular upper body clothing?: A Little Help from another person to put on and taking off regular lower body clothing?: Total 6 Click Score: 14   End of Session Equipment Utilized During Treatment: Gait belt;Rolling walker;Other (comment) (BSC)  Activity Tolerance: Patient tolerated treatment well Patient left: in bed;with call bell/phone within reach;with bed alarm set;with family/visitor present  OT Visit Diagnosis: Unsteadiness on feet (R26.81);Other abnormalities of gait and mobility (R26.89);History of falling (Z91.81);Muscle weakness (generalized) (M62.81);Pain Pain - Right/Left: Right Pain - part of body: Knee;Leg  Time: 1856-3149 OT Time Calculation (min): 29 min Charges:  OT General Charges $OT Visit: 1 Visit OT Evaluation $OT Eval Moderate Complexity: 1 Mod OT Treatments $Self Care/Home Management : 8-22 mins    Margaretmary Eddy [facility] 03/09/2021, 2:04 PM

## 2021-03-09 NOTE — Progress Notes (Signed)
Inpatient Rehab Admissions Coordinator:  Consult received. Note pt is under observation status at this time. Pt may not have the medical necessity to warrant an inpatient rehab stay if they remain observation. If status were to change to inpatient, [facility] will assess for appropriateness for potential CIR admission.   Wolfgang Phoenix, MS, CCC-SLP Admissions Coordinator [phone]

## 2021-03-09 NOTE — [place] FL2 (Signed)
[place] MEDICAID FL2 LEVEL OF CARE SCREENING TOOL     IDENTIFICATION  Patient Name: [name] Birthdate: 29-Apr-1923 Sex: female Admission Date (Current Location): 03/08/2021  [facility] [place] Number:      Facility and Address:  [facility] [place], [address], [place], [place] [address]      Provider Number: [id number]  Attending Physician Name and Address:  Myrtie Neither, MD  Relative Name and Phone Number:  Ellagrace Yoshida [phone]    Current Level of Care: Hospital Recommended Level of Care: Assisted Living Facility Prior Approval Number:    Date Approved/Denied:   PASRR Number: [id number]  Discharge Plan: Other (Comment) (ALF)    Current Diagnoses: Patient Active Problem List   Diagnosis Date Noted   Tibial plateau fracture 03/08/2021   Bilateral pleural effusion 10/03/2019   Acute respiratory failure with hypoxemia (HCC) 10/03/2019   Severe sepsis (HCC) 09/28/2019   Fever 09/28/2019   Generalized weakness 09/28/2019   Dehydration 09/28/2019   LLL pneumonia 09/27/2019   Mild cognitive impairment with memory loss 06/08/2014   SORE THROAT 04/10/2010   Dyspnea 10/25/2009   Cough 10/25/2009   HYPERLIPIDEMIA 10/24/2009   DEPRESSION 10/24/2009   Essential hypertension 10/24/2009   CAD 10/24/2009   ALLERGIC RHINITIS, SEASONAL 10/24/2009   Asthma 10/24/2009   OSTEOPENIA 10/24/2009    Orientation RESPIRATION BLADDER Height & Weight     Self, Time, Situation, Place  Normal Continent Weight: 121 lb 0.5 oz (54.9 kg) Height:  5\' 3"  (160 cm)  BEHAVIORAL SYMPTOMS/MOOD NEUROLOGICAL BOWEL NUTRITION STATUS      Continent Diet (see discharge summary)  AMBULATORY STATUS COMMUNICATION OF NEEDS Skin   Extensive Assist (knee immobilzer) Verbally Normal                       Personal Care Assistance Level of Assistance  Bathing, Dressing Bathing Assistance: Limited assistance   Dressing Assistance: Limited assistance      Functional Limitations Info  Hearing (hearing aids)   Hearing Info: Impaired      SPECIAL CARE FACTORS FREQUENCY  PT (By licensed PT), OT (By licensed OT)     PT Frequency: per facility OT Frequency: per facility            Contractures      Additional Factors Info  Code Status Code Status Info: FULL             Current Medications (03/09/2021):  This is the current hospital active medication list Current Facility-Administered Medications  Medication Dose Route Frequency Provider Last Rate Last Admin   acetaminophen (TYLENOL) tablet 650 mg  650 mg Oral Q6H PRN 03/11/2021 N, DO   650 mg at 03/09/21 1104   albuterol (PROVENTIL) (2.5 MG/3ML) 0.083% nebulizer solution 2.5 mg  2.5 mg Inhalation Q4H PRN 03/11/21 N, DO       amLODipine (NORVASC) tablet 5 mg  5 mg Oral Daily Nimrod, Carole N, DO   5 mg at 03/09/21 1027   And   irbesartan (AVAPRO) tablet 300 mg  300 mg Oral Daily 03/11/21 N, DO   300 mg at 03/09/21 1025   aspirin chewable tablet 81 mg  81 mg Oral Daily 03/11/21 N, DO   81 mg at 03/09/21 1029   budesonide (PULMICORT) nebulizer solution 0.5 mg  0.5 mg Inhalation BID 03/11/21 N, DO   0.5 mg at 03/09/21 0739   calcium-vitamin D (OSCAL WITH D) 500-200  MG-UNIT per tablet 1 tablet  1 tablet Oral Q breakfast Darlin Drop, DO   1 tablet at 03/09/21 1026   cholecalciferol (VITAMIN D3) tablet 1,000 Units  1,000 Units Oral Daily Darlin Drop, DO   1,000 Units at 03/09/21 1027   clopidogrel (PLAVIX) tablet 75 mg  75 mg Oral Daily Darlin Drop, DO   75 mg at 03/09/21 1026   docusate sodium (COLACE) capsule 100 mg  100 mg Oral Daily Dow Adolph N, DO   100 mg at 03/09/21 1029   donepezil (ARICEPT) tablet 10 mg  10 mg Oral QHS Dow Adolph N, DO   10 mg at 03/08/21 2348   enoxaparin (LOVENOX) injection 40 mg  40 mg Subcutaneous QHS Dow Adolph N, DO   40 mg at 03/08/21 2306   escitalopram (LEXAPRO) tablet 5 mg  5 mg Oral Daily Dow Adolph N, DO   5 mg  at 03/09/21 1026   ferrous sulfate tablet 650 mg  650 mg Oral Daily Darlin Drop, DO   650 mg at 03/09/21 1106   insulin aspart (novoLOG) injection 0-5 Units  0-5 Units Subcutaneous QHS Hall, Carole N, DO       insulin aspart (novoLOG) injection 0-9 Units  0-9 Units Subcutaneous TID WC Hall, Carole N, DO       ketorolac (TORADOL) 15 MG/ML injection 15 mg  15 mg Intravenous Q8H Hall, Carole N, DO   15 mg at 03/09/21 0552   loratadine (CLARITIN) tablet 10 mg  10 mg Oral Daily PRN Dow Adolph N, DO       melatonin tablet 3 mg  3 mg Oral QHS PRN Dow Adolph N, DO       metoprolol succinate (TOPROL-XL) 24 hr tablet 25 mg  25 mg Oral Daily Dow Adolph N, DO   25 mg at 03/09/21 1030   metoprolol tartrate (LOPRESSOR) injection 2.5 mg  2.5 mg Intravenous Q6H PRN Dow Adolph N, DO   2.5 mg at 03/09/21 0552   mirabegron ER (MYRBETRIQ) tablet 50 mg  50 mg Oral Daily Dow Adolph N, DO   50 mg at 03/09/21 1026   morphine 2 MG/ML injection 2 mg  2 mg Intravenous Q4H PRN Darlin Drop, DO       multivitamin with minerals tablet 1 tablet  1 tablet Oral Daily Dow Adolph N, DO   1 tablet at 03/09/21 1025   naloxone (NARCAN) injection 0.4 mg  0.4 mg Intravenous PRN Dow Adolph N, DO       ondansetron (ZOFRAN) injection 4 mg  4 mg Intravenous Q6H PRN Hall, Carole N, DO       polyethylene glycol (MIRALAX / GLYCOLAX) packet 17 g  17 g Oral Daily PRN Hall, Carole N, DO       psyllium (HYDROCIL/METAMUCIL) 1 packet  1 packet Oral Daily Dow Adolph N, DO   1 packet at 03/09/21 1027   rosuvastatin (CRESTOR) tablet 5 mg  5 mg Oral Daily Dow Adolph N, DO   5 mg at 03/09/21 1027     Discharge Medications: Please see discharge summary for a list of discharge medications.  Relevant Imaging Results:  Relevant Lab Results:   Additional Information nondisplaced bicondylar tibial plateau fracture- right knee immoblizer in place  Lannie Heaps M Shadell Brenn, LCSWA

## 2021-03-10 DIAGNOSIS — E119 Type 2 diabetes mellitus without complications: Secondary | ICD-10-CM

## 2021-03-10 DIAGNOSIS — I5032 Chronic diastolic (congestive) heart failure: Secondary | ICD-10-CM | POA: Diagnosis not present

## 2021-03-10 DIAGNOSIS — I10 Essential (primary) hypertension: Secondary | ICD-10-CM | POA: Diagnosis not present

## 2021-03-10 DIAGNOSIS — H9190 Unspecified hearing loss, unspecified ear: Secondary | ICD-10-CM

## 2021-03-10 DIAGNOSIS — M899 Disorder of bone, unspecified: Secondary | ICD-10-CM | POA: Diagnosis not present

## 2021-03-10 DIAGNOSIS — M949 Disorder of cartilage, unspecified: Secondary | ICD-10-CM

## 2021-03-10 DIAGNOSIS — S82141D Displaced bicondylar fracture of right tibia, subsequent encounter for closed fracture with routine healing: Secondary | ICD-10-CM | POA: Diagnosis not present

## 2021-03-10 DIAGNOSIS — H833X2 Noise effects on left inner ear: Secondary | ICD-10-CM

## 2021-03-10 DIAGNOSIS — I639 Cerebral infarction, unspecified: Secondary | ICD-10-CM | POA: Diagnosis not present

## 2021-03-10 DIAGNOSIS — I6381 Other cerebral infarction due to occlusion or stenosis of small artery: Secondary | ICD-10-CM | POA: Diagnosis not present

## 2021-03-10 LAB — GLUCOSE, CAPILLARY
Glucose-Capillary: 99 mg/dL (ref 70–99)
Glucose-Capillary: 160 mg/dL — ABNORMAL HIGH (ref 70–99)

## 2021-03-10 MED ORDER — ACETAMINOPHEN 325 MG PO TABS: 650.0000 mg | ORAL_TABLET | Freq: Four times a day (QID) | ORAL | 0 refills | Status: AC | PRN

## 2021-03-10 MED ORDER — POLYETHYLENE GLYCOL 3350 17 G PO PACK: 17.0000 g | PACK | Freq: Every day | ORAL | 0 refills | Status: AC

## 2021-03-10 MED ORDER — CALCIUM CARBONATE-VITAMIN D 500-200 MG-UNIT PO TABS: 1.0000 | ORAL_TABLET | Freq: Every day | ORAL | 0 refills | Status: AC

## 2021-03-10 NOTE — Care Management Obs Status (Signed)
MEDICARE OBSERVATION STATUS NOTIFICATION   Patient Details  Name: [name] MRN: [id number] Date of Birth: March 18, 1923   Medicare Observation Status Notification Given:  Yes    Bess Kinds, RN 03/10/2021, 10:23 AM

## 2021-03-10 NOTE — Progress Notes (Signed)
    Durable Medical Equipment  (From admission, onward)           Start     Ordered   03/10/21 1104  For home use only DME 3 n 1  Once       Comments: Drop Arm   03/10/21 1103   03/10/21 1103  For home use only DME lightweight manual wheelchair with seat cushion  Once       Comments: Patient suffers from fractured tibia which impairs their ability to perform daily activities like bathing, dressing, and toileting in the home.  A walker will not resolve  issue with performing activities of daily living. A wheelchair will allow patient to safely perform daily activities. Patient is not able to propel themselves in the home using a standard weight wheelchair due to general weakness. Patient can self propel in the lightweight wheelchair. Length of need 6 months. Needs to have swing arms. Accessories: elevating leg rests (ELRs), wheel locks, extensions and anti-tippers.   03/10/21 1103

## 2021-03-10 NOTE — Discharge Summary (Addendum)
Discharge Summary  YUMIKO ALKINS FAO:[id number] DOB: 1923/02/13  PCP: Chilton Greathouse, MD  Admit date: 03/08/2021 Discharge date: 03/10/2021  Time spent: 50  Recommendations for Outpatient Follow-up:  ORTHO   Discharge Diagnoses:  Active Hospital Problems   Diagnosis Date Noted   Tibial plateau fracture 03/08/2021   Hard of hearing 03/10/2021   Thalamic infarction [facility])    Chronic diastolic CHF (congestive heart failure) (HCC)    Type 2 diabetes mellitus without complication (HCC)    Mild cognitive impairment with memory loss 06/08/2014   Essential hypertension 10/24/2009   Disorder of bone and cartilage 10/24/2009    Resolved Hospital Problems  No resolved problems to display.    Discharge Condition: Improved  Diet recommendation: Cardiac  Vitals:   03/09/21 2032 03/10/21 0802  BP: (!) 140/59 139/85  Pulse: 80 78  Resp: 16 18  Temp: 97.9 F (36.6 C) 98 F (36.7 C)  SpO2: 96% 97%    History of present illness:     Hospital Course:  Principal Problem:   Tibial plateau fracture Active Problems:   Essential hypertension   Disorder of bone and cartilage   Mild cognitive impairment with memory loss   Hard of hearing   Thalamic infarction (HCC)   Chronic diastolic CHF (congestive heart failure) (HCC)   Type 2 diabetes mellitus without complication (HCC)   Latanga D Amborn is a 87 y.o. female with medical history significant for osteopenia, asthma, hypertension, type 2 diabetes, hyperlipidemia, mild cognitive impairment, who presented to [facility] ED from her independent living facility due to severe right knee pain and effusion with onset last night after twisting her right knee.  Last night patient was in the process of going out to celebrate her birthday with friends.  She was assisted from her wheelchair and into a motor vehicle, during the transfer she twisted her right knee.  Since then she has had pain with difficulty weightbearing on her right lower extremity.   She developed right knee effusion.  No falls.  She was brought into the ED for further evaluation.  She is accompanied in the room by one of her 3 sons.  Her pain is improved after receiving 4 mg of IV morphine.  Not lethargic.  Alert and interactive.  EDP discussed the case with orthopedic surgery who recommended placement of a knee stabilizer.  This was placed in her room in the ED at the time of this visit. #1 right nondisplaced bicondylar tibial plateau fracture.  Patient has been seen and evaluated by orthopedic and they recommend nonweightbearing she is not a surgical candidate.  Physical therapy has evaluated.   Patient will be kept on nonweightbearing with a mild knee immobilizer of the right lower extremity, see recommendation by orthopedic Dr. Jones Broom:  "We will keep her in a knee immobilizer nonweightbearing to allow this to heal.  This should do well without surgical management as long as we can keep her off of it. She will follow-up with me in 2-3 weeks for repeat x-rays in the office to ensure stabilityand appropriate healing.  " Will need to be discharged back to her assisted living facility at [facility] for skilled physical therapy..  With home health physical therapy, 3 and 1 bedside commode, and lightweight wheelchair.  On evaluation of patient today she is in good spirits she stated she has not needed to take any pain medicine. Discussed at length with her sons SIDD AND HERSH  In the emergency department Temperature 98.4.  BP 154/66, pulse 76, respiratory rate 15, O2 saturation 97% on room air.  Lab studies significant for serum sodium 135, potassium 4.0, serum bicarb 26, serum glucose 195, BUN 23, creatinine 0.77, GFR greater than 60.  WBC 7.4, hemoglobin 11.0, MCV 89, platelet count 275, neutrophil count 5.6.  Chest x-ray nonacute.   COVID-19 screening test was negative  2.  History of CVA we will continue aspirin and Plavix and Crestor from home  3.  Dementia  continue donepezil  4.  2 diabetes mellitus stable  5.  Hypertension slightly uncontrolled we will continue her home dose of amlodipine and Avapro dexterously restarted  6.  Chronic diastolic CHF Her last 2D echo done on October 01, 2019 revealed ejection fraction of 70 to 75% There is no issue with this she is euvolemic we will continue to closely monitor her volume status continue on home medication strict I&O  7.  Iron deficiency anemia Hemoglobin is at baseline Continue ferrous sulfate supplement Osteopenia.  Her vitamin D3 is normal Patient was started on D3 with Os-Cal Procedures: None  Consultations: PT OT Physical medicine and rehab  Discharge Exam: BP 139/85 (BP Location: Left Arm)   Pulse 78   Temp 98 F (36.7 C)   Resp 18   Ht 5\' 3"  (1.6 m)   Wt 54.9 kg   SpO2 97%   BMI 21.44 kg/m   General: Alert oriented to person and place very pleasant she is not in any distress Cardiovascular: Regular rate and rhythm no murmur Respiratory: Clear to auscultation bilaterally  Discharge Instructions You were cared for by a hospitalist during your hospital stay. If you have any questions about your discharge medications or the care you received while you were in the hospital after you are discharged, you can call the unit and asked to speak with the hospitalist on call if the hospitalist that took care of you is not available. Once you are discharged, your primary care physician will handle any further medical issues. Please note that NO REFILLS for any discharge medications will be authorized once you are discharged, as the person and place is imperative that you return to your primary care physician (or establish a relationship with a primary care physician if you do not have one) for your aftercare needs so that they can reassess your need for medications and monitor your lab values.  Discharge Instructions     Diet - low sodium heart healthy   Complete by: As directed     Discharge instructions   Complete by: As directed    F/U WITH ORTHO 2-3WKS /  NWBRT   Increase activity slowly   Complete by: As directed    NWBRT      Allergies as of 03/10/2021       Reactions   Tussionex Pennkinetic Er [hydrocod Polst-cpm Polst Er] Other (See Comments)   Lethargy- Had to reverse hydrocodone with Narcan.   Benzodiazepines Other (See Comments)   Family reports high sensitivity   Sulfonamide Derivatives Other (See Comments)   Nervous, agitation   Tricyclic Antidepressants Other (See Comments)   Family reports high sensitivity        Medication List     STOP taking these medications    Fluticasone Propionate (Inhal) 250 MCG/BLIST Aepb   GNP Earwax Removal Drops 6.5 % OTIC solution Generic drug: carbamide peroxide   Myrbetriq 50 MG Tb24 tablet Generic drug: mirabegron ER       TAKE these medications  acetaminophen 325 MG tablet Commonly known as: TYLENOL Take 2 tablets (650 mg total) by mouth every 6 (six) hours as needed for mild pain, fever or headache.   AeroChamber MV inhaler Use as instructed   amLODipine-olmesartan 5-40 MG tablet Commonly known as: AZOR Take 1 tablet by mouth daily.   aspirin 81 MG chewable tablet Chew 81 mg by mouth daily.   calcium-vitamin D 500-200 MG-UNIT tablet Commonly known as: OSCAL WITH D Take 1 tablet by mouth daily with breakfast. Start taking on: March 11, 2021   Cholecalciferol 25 MCG (1000 UT) capsule Take 1,000 Units by mouth daily.   clopidogrel 75 MG tablet Commonly known as: PLAVIX TAKE 1 TABLET BY MOUTH DAILY   docusate sodium 100 MG capsule Commonly known as: COLACE Take 100 mg by mouth daily as needed for mild constipation.   donepezil 10 MG tablet Commonly known as: ARICEPT Take 10 mg by mouth daily.   escitalopram 5 MG tablet Commonly known as: LEXAPRO Take 5 mg by mouth daily.   ipratropium-albuterol 0.5-2.5 (3) MG/3ML Soln Commonly known as: DUONEB Take 3 mLs by  nebulization every 4 (four) hours as needed (shortness of breath).   Iron 325 (65 Fe) MG Tabs Take 650 mg by mouth daily.   ketorolac 0.5 % ophthalmic solution Commonly known as: ACULAR Place 1 drop into both eyes daily.   Konsyl Daily Fiber 28.3 % Pack Generic drug: Psyllium Take 1 each by mouth daily.   loratadine 10 MG tablet Commonly known as: CLARITIN Take 10 mg by mouth daily as needed for allergies.   memantine 10 MG tablet Commonly known as: NAMENDA Take 10 mg by mouth 2 (two) times daily.   metFORMIN 500 MG tablet Commonly known as: GLUCOPHAGE Take 500 mg by mouth 2 (two) times daily.   metoprolol succinate 25 MG 24 hr tablet Commonly known as: TOPROL-XL Take 25 mg by mouth daily.   mometasone-formoterol 100-5 MCG/ACT Aero Commonly known as: DULERA Inhale 2 puffs into the lungs 2 (two) times daily.   multivitamin tablet Take 1 tablet by mouth daily.   polyethylene glycol 17 g packet Commonly known as: MIRALAX / GLYCOLAX Take 17 g by mouth daily.   ProAir HFA 108 (90 Base) MCG/ACT inhaler Generic drug: albuterol Inhale 2 puffs into the lungs every 4 (four) hours as needed for wheezing.   rosuvastatin 5 MG tablet Commonly known as: CRESTOR Take 5 mg by mouth daily.               Durable Medical Equipment  (From admission, onward)           Start     Ordered   03/10/21 1336  DME 3-in-1  Once        03/10/21 1338   03/10/21 1104  For home use only DME 3 n 1  Once       Comments: Drop Arm   03/10/21 1103   03/10/21 1103  For home use only DME lightweight manual wheelchair with seat cushion  Once       Comments: Patient suffers from fractured tibia which impairs their ability to perform daily activities like bathing, dressing, and toileting in the home.  A walker will not resolve  issue with performing activities of daily living. A wheelchair will allow patient to safely perform daily activities. Patient is not able to propel themselves in the  home using a standard weight wheelchair due to general weakness. Patient can self propel in the lightweight wheelchair.  Length of need 6 months. Needs to have swing arms. Accessories: elevating leg rests (ELRs), wheel locks, extensions and anti-tippers.   03/10/21 1103           Allergies  Allergen Reactions   Tussionex Pennkinetic Er [Hydrocod Polst-Cpm Polst Er] Other (See Comments)    Lethargy- Had to reverse hydrocodone with Narcan.   Benzodiazepines Other (See Comments)    Family reports high sensitivity   Sulfonamide Derivatives Other (See Comments)    Nervous, agitation   Tricyclic Antidepressants Other (See Comments)    Family reports high sensitivity      The results of significant diagnostics from this hospitalization (including imaging, microbiology, ancillary and laboratory) are listed below for reference.    Significant Diagnostic Studies: CT Knee Right Wo Contrast  Result Date: 03/08/2021 CLINICAL DATA:  Right knee pain and swelling after twisting injury yesterday. EXAM: CT OF THE RIGHT KNEE WITHOUT CONTRAST TECHNIQUE: Multidetector CT imaging of the right knee was performed according to the standard protocol. Multiplanar CT image reconstructions were also generated. COMPARISON:  Right knee x-rays from same day. FINDINGS: Bones/Joint/Cartilage Acute minimally displaced fracture of the posterior lateral tibial plateau. Acute minimally depressed fracture of the posterior medial tibial plateau with associated nondisplaced longitudinal component involving the posterior metaphyseal cortex (series 6, image 51). No dislocation. Moderate to severe medial compartment joint space narrowing with subchondral sclerosis and cystic change. Small tricompartmental marginal osteophytes. Osteopenia. Large lipohemarthrosis. Ligaments Ligaments are suboptimally evaluated by CT. Muscles and Tendons Grossly intact. Soft tissue No fluid collection or hematoma.  No soft tissue mass. IMPRESSION: 1.  Acute bicondylar tibial plateau fracture as described above. 2. Large lipohemarthrosis. 3. Moderate to severe medial compartment osteoarthritis. Electronically Signed   By: Obie DredgeWilliam T Derry M.D.   On: 03/08/2021 21:25   DG CHEST PORT 1 VIEW  Result Date: 03/08/2021 CLINICAL DATA:  Tachypnea EXAM: PORTABLE CHEST 1 VIEW COMPARISON:  10/03/2019 FINDINGS: Single frontal view of the chest demonstrates an unremarkable cardiac silhouette. No acute airspace disease, effusion, or pneumothorax. No acute bony abnormalities. Prominent skin folds overlie the right chest. IMPRESSION: 1. No acute intrathoracic process. Electronically Signed   By: Sharlet SalinaMichael  Brown M.D.   On: 03/08/2021 23:27   DG Knee Complete 4 Views Right  Result Date: 03/08/2021 CLINICAL DATA:  Knee pain following twisting. EXAM: RIGHT KNEE - COMPLETE 4+ VIEW COMPARISON:  None FINDINGS: Osteopenia. Subtle lucency in the lateral tibial plateau seen on the oblique view. No sign of frank displaced fracture. Mild irregularity along the posterior aspect of the tibial plateau as well w. degenerative changes greatest in the medial compartment. Signs of vascular calcification in the soft tissues. Moderate joint effusion.  This is in the suprapatellar recess. IMPRESSION: 1. Moderate joint effusion. 2. Question nondisplaced injury to the medial tibial plateau. Electronically Signed   By: Donzetta KohutGeoffrey  Wile M.D.   On: 03/08/2021 18:29    Microbiology: Recent Results (from the past 240 hour(s))  Resp Panel by RT-PCR (Flu A&B, Covid) Nasopharyngeal Swab     Status: None   Collection Time: 03/08/21 11:04 PM   Specimen: Nasopharyngeal Swab; Nasopharyngeal(NP) swabs in vial transport medium  Result Value Ref Range Status   SARS Coronavirus 2 by RT PCR NEGATIVE NEGATIVE Final    Comment: (NOTE) SARS-CoV-2 target nucleic acids are NOT DETECTED.  The SARS-CoV-2 RNA is generally detectable in upper respiratory specimens during the acute phase of infection. The  lowest concentration of SARS-CoV-2 viral copies this assay can detect is 138 copies/mL.  A negative result does not preclude SARS-Cov-2 infection and should not be used as the sole basis for treatment or other patient management decisions. A negative result may occur with  improper specimen collection/handling, submission of specimen other than nasopharyngeal swab, presence of viral mutation(s) within the areas targeted by this assay, and inadequate number of viral copies(<138 copies/mL). A negative result must be combined with clinical observations, patient history, and epidemiological information. The expected result is Negative.  Fact Sheet for Patients:  BloggerCourse.com  Fact Sheet for Healthcare Providers:  SeriousBroker.it  This test is no t yet approved or cleared by the [place] FDA and  has been authorized for detection and/or diagnosis of SARS-CoV-2 by FDA under an Emergency Use Authorization (EUA). This EUA will remain  in effect (meaning this test can be used) for the duration of the COVID-19 declaration under Section 564(b)(1) of the Act, 21 U.S.C.section 360bbb-3(b)(1), unless the authorization is terminated  or revoked sooner.       Influenza A by PCR NEGATIVE NEGATIVE Final   Influenza B by PCR NEGATIVE NEGATIVE Final    Comment: (NOTE) The Xpert Xpress SARS-CoV-2/FLU/RSV plus assay is intended as an aid in the diagnosis of influenza from Nasopharyngeal swab specimens and should not be used as a sole basis for treatment. Nasal washings and aspirates are unacceptable for Xpert Xpress SARS-CoV-2/FLU/RSV testing.  Fact Sheet for Patients: BloggerCourse.com  Fact Sheet for Healthcare Providers: SeriousBroker.it  This test is not yet approved or cleared by the [place] FDA and has been authorized for detection and/or diagnosis of SARS-CoV-2 by FDA under  an Emergency Use Authorization (EUA). This EUA will remain in effect (meaning this test can be used) for the duration of the COVID-19 declaration under Section 564(b)(1) of the Act, 21 U.S.C. section 360bbb-3(b)(1), unless the authorization is terminated or revoked.  Performed at [facility], [address] [place], [place] [address]      Labs: Basic Metabolic Panel: Recent Labs  Lab 03/08/21 1712 03/09/21 0424  NA 135 136  K 4.0 4.0  CL 100 103  CO2 26 25  GLUCOSE 195* 111*  BUN 23 21  CREATININE 0.77 0.69  CALCIUM 9.2 8.9  MG  --  1.9  PHOS  --  3.3   Liver Function Tests: No results for input(s): AST, ALT, ALKPHOS, BILITOT, PROT, ALBUMIN in the last 168 hours. No results for input(s): LIPASE, AMYLASE in the last 168 hours. No results for input(s): AMMONIA in the last 168 hours. CBC: Recent Labs  Lab 03/08/21 1712 03/09/21 0424  WBC 7.4 6.2  NEUTROABS 5.6  --   HGB 11.0* 10.7*  HCT 33.3* 32.6*  MCV 89.0 89.6  PLT 275 236   Cardiac Enzymes: No results for input(s): CKTOTAL, CKMB, CKMBINDEX, TROPONINI in the last 168 hours. BNP: BNP (last 3 results) No results for input(s): BNP in the last 8760 hours.  ProBNP (last 3 results) No results for input(s): PROBNP in the last 8760 hours.  CBG: Recent Labs  Lab 03/09/21 1214 03/09/21 1606 03/09/21 2035 03/10/21 0627 03/10/21 1147  GLUCAP 127* 97 201* 99 160*       Signed:  Myrtie Neither, MD [facility] 03/10/2021, 1:38 PM

## 2021-03-10 NOTE — TOC Progression Note (Signed)
Transition of Care [facility]) - Progression Note    Patient Details  Name: [name] MRN: [id number] Date of Birth: 05-10-1923  Transition of Care [facility]) CM/SW Contact  Bess Kinds, RN Phone [phone] 03/10/2021, 10:46 AM  Clinical Narrative:     Spoke with patient at the bedside. Patient is hard of hearing, but was able to give permission for CM to call her son, Gigi Gin. Spoke with Sid on his phone as listed in Epic. Discussed transition plans and comparison of inpatient rehab vs SNF vs back to ALF with [facility] PT [[facility] is active]. Discussed note from CIR liaison indicating a lack of medical necessity if patient stays at observation status.   Discussed if patient transitions back to [facility], how would she transport? Son would provide transportation back to facility.   Discussed DME needs, patient will need lightweight manual wheelchair with swing arm and a drop arm 3-N-1. Will place orders per MD and refer to [employer] for delivery to the room.    Expected Discharge Plan: Assisted Living Barriers to Discharge: Continued Medical Work up  Expected Discharge Plan and Services Expected Discharge Plan: Assisted Living                                               Social Determinants of Health (SDOH) Interventions    Readmission Risk Interventions No flowsheet data found.

## 2021-03-10 NOTE — Progress Notes (Signed)
Subjective: Patient seen at 10 AM today.  She is in bed resting comfortably with knee immobilizer intact to her right leg.  Objective: Vital signs in last 24 hours: Temp:  [97.9 F (36.6 C)-98 F (36.7 C)] 98 F (36.7 C) (07/17 0802) Pulse Rate:  [78-80] 78 (07/17 0802) Resp:  [16-18] 18 (07/17 0802) BP: (139-140)/(59-85) 139/85 (07/17 0802) SpO2:  [95 %-97 %] 97 % (07/17 0802)  Intake/Output from previous day: 07/16 0701 - 07/17 0700 In: 840 [P.O.:840] Out: 400 [Urine:400] Intake/Output this shift: No intake/output data recorded.  Recent Labs    03/08/21 1712 03/09/21 0424  HGB 11.0* 10.7*   Recent Labs    03/08/21 1712 03/09/21 0424  WBC 7.4 6.2  RBC 3.74* 3.64*  HCT 33.3* 32.6*  PLT 275 236   Recent Labs    03/08/21 1712 03/09/21 0424  NA 135 136  K 4.0 4.0  CL 100 103  CO2 26 25  BUN 23 21  CREATININE 0.77 0.69  GLUCOSE 195* 111*  CALCIUM 9.2 8.9   No results for input(s): LABPT, INR in the last 72 hours. Right lower extremity exam: Knee immobilizer intact to her right lower extremity.  It is fitting well.  She is able to plantar and dorsiflex her right foot without difficulty and has normal sensation in it.    Assessment/Plan: Nondisplaced bicondylar right tibial plateau fracture in a 87 year old female. Plan: I spoke to the patient's son on the telephone.  All of his questions were answered. She will ambulate nonweightbearing on the right for the next 6 to 8 weeks.  She may remove the knee immobilizer to wash her leg, but otherwise needs to wear it full-time. Follow-up with Dr. Ave Filter in the office in 2 to 3 weeks.  We will get repeat x-rays of the right knee at that time.  Does not need surgical management.   Matthew Folks 03/10/2021, 2:39 PM

## 2021-03-10 NOTE — Progress Notes (Signed)
Physical Therapy Treatment Patient Details Name: [name] MRN: [id number] DOB: [birth year] Today's Date: 03/10/2021    History of Present Illness 87 year old female who was trying to get to her birthday party when she twisted her knee and felt a sudden pain and swelling.  She was evaluated in the emergency department and found to have a nondisplaced bicondylar tibial plateau fracture. No surgical managment is recommended by MD at this time. Pt to wear knee immobilizer and NWB    PT Comments    Patient received in bed, very pleasant but also very hard of hearing. Would respond appropriately to simple questions and cues, however also said multiple times that "I have no idea what is happening or what you want me to do" even with thorough explanation and hand over hand assist from PT/tech. Difficult to assess cognition today due to hearing impairment. Made multiple attempts at both sit to stand and squat pivot transfers but she was pushing posteriorly to the point that it was unsafe to continue while still being able to maintain NWB on R LE even with +2 assist. Returned her to supine in bed and left with all needs met, bed alarm active. Will continue to follow.     Follow Up Recommendations  CIR     Equipment Recommendations  Wheelchair (measurements PT);3in1 (PT);Wheelchair cushion (measurements PT) (drop arm 3 in 1)    Recommendations for Other Services       Precautions / Restrictions Precautions Precautions: Fall Required Braces or Orthoses: Knee Immobilizer - Right Knee Immobilizer - Right: On at all times Restrictions Weight Bearing Restrictions: Yes RLE Weight Bearing: Non weight bearing    Mobility  Bed Mobility Overal bed mobility: Needs Assistance Bed Mobility: Supine to Sit;Sit to Supine     Supine to sit: Max assist;+2 for physical assistance;HOB elevated Sit to supine: Max assist;+2 for physical assistance   General bed mobility comments: maxAx2 for managing  trunk/BLEs and pivoting hips to EOB, also for managing strong posterior lean and preventing her from sliding forward off edge of mattress    Transfers Overall transfer level: Needs assistance Equipment used: Rolling walker (2 wheeled) Transfers: Sit to/from [employer] Sit to Stand: Total assist;+2 physical assistance   Squat pivot transfers: Total assist;+2 physical assistance     General transfer comment: made multiple attempts at sit to stand with totalAx2 with limited success- patient with zero effort or initiation and did nto follow cues. Attempted squat pivot transfer multiple times however patient kept leaning and pushing herself backwards making transfer impossible to perform safely  Ambulation/Gait             General Gait Details: unable   Stairs             Wheelchair Mobility    Modified Rankin (Stroke Patients Only)       Balance Overall balance assessment: Needs assistance Sitting-balance support: No upper extremity supported;Feet supported Sitting balance-Leahy Scale: Poor Sitting balance - Comments: posterior lean Postural control: Posterior lean Standing balance support: Bilateral upper extremity supported;During functional activity Standing balance-Leahy Scale: Zero Standing balance comment: total reliance on external support due to RLE NWB                            Cognition Arousal/Alertness: Awake/alert Behavior During Therapy: WFL for tasks assessed/performed Overall Cognitive Status: Difficult to assess  General Comments: very hard to assess cognition today due to her being SO hard of hearing- responds but often tells me she doesn't know what we are doing even with thorough explanation and hand over hand assist from therapist/tech      Exercises      General Comments        Pertinent Vitals/Pain Pain Assessment: Faces Faces Pain Scale: Hurts little  more Pain Location: RLE during mobility Pain Descriptors / Indicators: Guarding;Grimacing;Moaning Pain Intervention(s): Limited activity within patient's tolerance;Monitored during session;Repositioned;Premedicated before session    Home Living                      Prior Function            PT Goals (current goals can now be found in the care plan section) Acute Rehab PT Goals Patient Stated Goal: to walk again PT Goal Formulation: With patient/family Time For Goal Achievement: 03/23/21 Potential to Achieve Goals: Good Progress towards PT goals: Not progressing toward goals - comment (limited by cognition vs hearing??)    Frequency    Min 5X/week      PT Plan Current plan remains appropriate    Co-evaluation              AM-PAC PT "6 Clicks" Mobility   Outcome Measure  Help needed turning from your back to your side while in a flat bed without using bedrails?: A Lot Help needed moving from lying on your back to sitting on the side of a flat bed without using bedrails?: Total Help needed moving to and from a bed to a chair (including a wheelchair)?: Total Help needed standing up from a chair using your arms (e.g., wheelchair or bedside chair)?: Total Help needed to walk in hospital room?: Total Help needed climbing 3-5 steps with a railing? : Total 6 Click Score: 7    End of Session Equipment Utilized During Treatment: Gait belt;Right knee immobilizer Activity Tolerance: Patient tolerated treatment well Patient left: in bed;with call bell/phone within reach;with bed alarm set Nurse Communication: Mobility status PT Visit Diagnosis: Other abnormalities of gait and mobility (R26.89);Pain Pain - Right/Left: Right Pain - part of body: Knee     Time: 2423-5361 PT Time Calculation (min) (ACUTE ONLY): 20 min  Charges:  $Therapeutic Activity: 8-22 mins                    Windell Norfolk, DPT, PN1   Supplemental Physical Therapist [place]    Pager  [phone] Acute Rehab Office [phone]

## 2021-03-11 NOTE — Progress Notes (Signed)
Spoke with Eman at [facility]. Paperwork received at discharge last night was incomplete. FL2 updated with correct diet and medications added. Reached out to physician advisor for signature. DC summary, orthopedic note, and FL2 faxed to [phone].   Hortencia Conradi, RN MSN CCM Transitions of Care [phone]

## 2021-03-11 NOTE — NC FL2 (Signed)
[place] MEDICAID FL2 LEVEL OF CARE SCREENING TOOL     IDENTIFICATION  Patient Name: [name] Birthdate: [birth year] Sex: female Admission Date (Current Location): 03/08/2021  [facility] and [place] Number:      Facility and Address:  [facility] [place], [address], [place], [place] [address]      Provider Number: [id number]  Attending Physician Name and Address:  No att. providers found  Relative Name and Phone Number:  Annaliesa Blann [phone]    Current Level of Care: Hospital Recommended Level of Care: Assisted Living Facility Prior Approval Number:    Date Approved/Denied:   PASRR Number: [id number]  Discharge Plan: Other (Comment) (ALF)    Current Diagnoses: Patient Active Problem List   Diagnosis Date Noted   Hard of hearing 03/10/2021   Thalamic infarction [facility])    Chronic diastolic CHF (congestive heart failure) (HCC)    Type 2 diabetes mellitus without complication (HCC)    Tibial plateau fracture 03/08/2021   Bilateral pleural effusion 10/03/2019   Acute respiratory failure with hypoxemia (HCC) 10/03/2019   Severe sepsis (HCC) 09/28/2019   Fever 09/28/2019   Generalized weakness 09/28/2019   Dehydration 09/28/2019   LLL pneumonia 09/27/2019   Mild cognitive impairment with memory loss 06/08/2014   SORE THROAT 04/10/2010   Dyspnea 10/25/2009   Cough 10/25/2009   HYPERLIPIDEMIA 10/24/2009   DEPRESSION 10/24/2009   Essential hypertension 10/24/2009   CAD 10/24/2009   ALLERGIC RHINITIS, SEASONAL 10/24/2009   Asthma 10/24/2009   Disorder of bone and cartilage 10/24/2009    Orientation RESPIRATION BLADDER Height & Weight     Self, Time, Situation, Place  Normal Continent Weight: 54.9 kg Height:  5\' 3"  (160 cm)  BEHAVIORAL SYMPTOMS/MOOD NEUROLOGICAL BOWEL NUTRITION STATUS      Continent Diet (No added salt (NAS))  AMBULATORY STATUS COMMUNICATION OF NEEDS Skin   Extensive Assist (knee immobilzer) Verbally Normal                        Personal Care Assistance Level of Assistance  Bathing, Dressing Bathing Assistance: Limited assistance   Dressing Assistance: Limited assistance     Functional Limitations Info  Hearing (hearing aids)   Hearing Info: Impaired      SPECIAL CARE FACTORS FREQUENCY  PT (By licensed PT), OT (By licensed OT)     PT Frequency: per facility OT Frequency: per facility            Contractures Contractures Info: Not present    Additional Factors Info  Code Status Code Status Info: FULL             Current Medications (03/11/2021):  This is the current hospital active medication list No current facility-administered medications for this encounter.   Current Outpatient Medications  Medication Sig Dispense Refill   amLODipine-olmesartan (AZOR) 5-40 MG tablet Take 1 tablet by mouth daily.      aspirin 81 MG chewable tablet Chew 81 mg by mouth daily.     Cholecalciferol 25 MCG (1000 UT) capsule Take 1,000 Units by mouth daily.      clopidogrel (PLAVIX) 75 MG tablet TAKE 1 TABLET BY MOUTH DAILY 30 tablet 0   docusate sodium (COLACE) 100 MG capsule Take 100 mg by mouth daily as needed for mild constipation.     donepezil (ARICEPT) 10 MG tablet Take 10 mg by mouth daily.     escitalopram (LEXAPRO) 5 MG tablet Take 5 mg by mouth daily.  Ferrous Sulfate (IRON) 325 (65 Fe) MG TABS Take 650 mg by mouth daily.     ipratropium-albuterol (DUONEB) 0.5-2.5 (3) MG/3ML SOLN Take 3 mLs by nebulization every 4 (four) hours as needed (shortness of breath).     ketorolac (ACULAR) 0.5 % ophthalmic solution Place 1 drop into both eyes daily.     loratadine (CLARITIN) 10 MG tablet Take 10 mg by mouth daily as needed for allergies.      memantine (NAMENDA) 10 MG tablet Take 10 mg by mouth 2 (two) times daily.     metFORMIN (GLUCOPHAGE) 500 MG tablet Take 500 mg by mouth 2 (two) times daily.     metoprolol succinate (TOPROL-XL) 25 MG 24 hr tablet Take 25 mg by mouth daily.       mometasone-formoterol (DULERA) 100-5 MCG/ACT AERO Inhale 2 puffs into the lungs 2 (two) times daily. 1 Inhaler 0   Multiple Vitamin (MULTIVITAMIN) tablet Take 1 tablet by mouth daily.     PROAIR HFA 108 (90 BASE) MCG/ACT inhaler Inhale 2 puffs into the lungs every 4 (four) hours as needed for wheezing.   6   Psyllium (KONSYL DAILY FIBER) 28.3 % PACK Take 1 each by mouth daily.     rosuvastatin (CRESTOR) 5 MG tablet Take 5 mg by mouth daily.     Spacer/Aero-Holding Chambers (AEROCHAMBER MV) inhaler Use as instructed 1 each 0   acetaminophen (TYLENOL) 325 MG tablet Take 2 tablets (650 mg total) by mouth every 6 (six) hours as needed for mild pain, fever or headache. 30 tablet 0   calcium-vitamin D (OSCAL WITH D) 500-200 MG-UNIT tablet Take 1 tablet by mouth daily with breakfast. 30 tablet 0   polyethylene glycol (MIRALAX / GLYCOLAX) 17 g packet Take 17 g by mouth daily. 14 each 0     Discharge Medications: Please see discharge summary for a list of discharge medications.  Relevant Imaging Results:  Relevant Lab Results:   Additional Information nondisplaced bicondylar tibial plateau fracture - right knee immobilizer in place  TAKE these medications      acetaminophen 325 MG tablet  Commonly known as: TYLENOL  Take 2 tablets (650 mg total) by mouth every 6 (six) hours as needed for mild pain, fever or headache.     AeroChamber MV inhaler  Use as instructed     amLODipine-olmesartan 5-40 MG tablet  Commonly known as: AZOR  Take 1 tablet by mouth daily.     aspirin 81 MG chewable tablet  Chew 81 mg by mouth daily.     calcium-vitamin D 500-200 MG-UNIT tablet  Commonly known as: OSCAL WITH D  Take 1 tablet by mouth daily with breakfast.  Start taking on: March 11, 2021     Cholecalciferol 25 MCG (1000 UT) capsule  Take 1,000 Units by mouth daily.     clopidogrel 75 MG tablet  Commonly known as: PLAVIX  TAKE 1 TABLET BY MOUTH DAILY     docusate sodium 100 MG capsule  Commonly known as: COLACE  Take 100  mg by mouth daily as needed for mild constipation.     donepezil 10 MG tablet  Commonly known as: ARICEPT  Take 10 mg by mouth daily.     escitalopram 5 MG tablet  Commonly known as: LEXAPRO  Take 5 mg by mouth daily.     ipratropium-albuterol 0.5-2.5 (3) MG/3ML Soln  Commonly known as: DUONEB  Take 3 mLs by nebulization every 4 (four) hours as needed (shortness of breath).  Iron 325 (65 Fe) MG Tabs  Take 650 mg by mouth daily.     ketorolac 0.5 % ophthalmic solution  Commonly known as: ACULAR  Place 1 drop into both eyes daily.     Konsyl Daily Fiber 28.3 % Pack  Generic drug: Psyllium  Take 1 each by mouth daily.     loratadine 10 MG tablet  Commonly known as: CLARITIN  Take 10 mg by mouth daily as needed for allergies.     memantine 10 MG tablet  Commonly known as: NAMENDA  Take 10 mg by mouth 2 (two) times daily.     metFORMIN 500 MG tablet  Commonly known as: GLUCOPHAGE  Take 500 mg by mouth 2 (two) times daily.     metoprolol succinate 25 MG 24 hr tablet  Commonly known as: TOPROL-XL  Take 25 mg by mouth daily.     mometasone-formoterol 100-5 MCG/ACT Aero  Commonly known as: DULERA  Inhale 2 puffs into the lungs 2 (two) times daily.     multivitamin tablet  Take 1 tablet by mouth daily.     polyethylene glycol 17 g packet  Commonly known as: MIRALAX / GLYCOLAX  Take 17 g by mouth daily.     ProAir HFA 108 (90 Base) MCG/ACT inhaler  Generic drug: albuterol  Inhale 2 puffs into the lungs every 4 (four) hours as needed for wheezing.     rosuvastatin 5 MG tablet  Commonly known as: CRESTOR  Take 5 mg by mouth daily.  Bess Kinds, RN

## 2022-01-19 ENCOUNTER — Ambulatory Visit
Admission: RE | Admit: 2022-01-19 | Discharge: 2022-01-19 | Disposition: A | Discharge status: Home / Self Care | Payer: Medicare Other | Source: Ambulatory Visit | Attending: Urgent Care | Admitting: Urgent Care

## 2022-01-19 VITALS — BP 127/70 | HR 78 | Temp 97.8°F

## 2022-01-19 DIAGNOSIS — H6121 Impacted cerumen, right ear: Secondary | ICD-10-CM | POA: Diagnosis not present

## 2022-01-19 MED ORDER — CARBAMIDE PEROXIDE 6.5 % OT SOLN: 5.0000 [drp] | Freq: Two times a day (BID) | OTIC | 0 refills | Status: DC

## 2022-01-19 MED ORDER — CARBAMIDE PEROXIDE 6.5 % OT SOLN: 5.0000 [drp] | Freq: Two times a day (BID) | OTIC | 0 refills | Status: AC

## 2022-01-19 NOTE — ED Provider Notes (Signed)
[name] - URGENT CARE CENTER   MRN: [id number] DOB: August 12, 1923  Subjective:   [name] is a 88 y.o. female presenting for an evaluation for cerumen impaction.  Patient is set to have hearing aids placed.  She was advised that she had earwax buildup in both ears and needed them to get washed out so that they could test the hearing aids appropriately at [facility]. No fever, drainage.   No current facility-administered medications for this encounter.  Current Outpatient Medications:    acetaminophen (TYLENOL) 325 MG tablet, Take 2 tablets (650 mg total) by mouth every 6 (six) hours as needed for mild pain, fever or headache., Disp: 30 tablet, Rfl: 0   amLODipine-olmesartan (AZOR) 5-40 MG tablet, Take 1 tablet by mouth daily. , Disp: , Rfl:    aspirin 81 MG chewable tablet, Chew 81 mg by mouth daily., Disp: , Rfl:    calcium-vitamin D (OSCAL WITH D) 500-200 MG-UNIT tablet, Take 1 tablet by mouth daily with breakfast., Disp: 30 tablet, Rfl: 0   Cholecalciferol 25 MCG (1000 UT) capsule, Take 1,000 Units by mouth daily. , Disp: , Rfl:    clopidogrel (PLAVIX) 75 MG tablet, TAKE 1 TABLET BY MOUTH DAILY, Disp: 30 tablet, Rfl: 0   docusate sodium (COLACE) 100 MG capsule, Take 100 mg by mouth daily as needed for mild constipation., Disp: , Rfl:    donepezil (ARICEPT) 10 MG tablet, Take 10 mg by mouth daily., Disp: , Rfl:    escitalopram (LEXAPRO) 5 MG tablet, Take 5 mg by mouth daily., Disp: , Rfl:    Ferrous Sulfate (IRON) 325 (65 Fe) MG TABS, Take 650 mg by mouth daily., Disp: , Rfl:    ipratropium-albuterol (DUONEB) 0.5-2.5 (3) MG/3ML SOLN, Take 3 mLs by nebulization every 4 (four) hours as needed (shortness of breath)., Disp: , Rfl:    ketorolac (ACULAR) 0.5 % ophthalmic solution, Place 1 drop into both eyes daily., Disp: , Rfl:    loratadine (CLARITIN) 10 MG tablet, Take 10 mg by mouth daily as needed for allergies. , Disp: , Rfl:    memantine (NAMENDA) 10 MG tablet, Take 10 mg by  mouth 2 (two) times daily., Disp: , Rfl:    metFORMIN (GLUCOPHAGE) 500 MG tablet, Take 500 mg by mouth 2 (two) times daily., Disp: , Rfl:    metoprolol succinate (TOPROL-XL) 25 MG 24 hr tablet, Take 25 mg by mouth daily. , Disp: , Rfl:    mometasone-formoterol (DULERA) 100-5 MCG/ACT AERO, Inhale 2 puffs into the lungs 2 (two) times daily., Disp: 1 Inhaler, Rfl: 0   Multiple Vitamin (MULTIVITAMIN) tablet, Take 1 tablet by mouth daily., Disp: , Rfl:    polyethylene glycol (MIRALAX / GLYCOLAX) 17 g packet, Take 17 g by mouth daily., Disp: 14 each, Rfl: 0   PROAIR HFA 108 (90 BASE) MCG/ACT inhaler, Inhale 2 puffs into the lungs every 4 (four) hours as needed for wheezing. , Disp: , Rfl: 6   Psyllium (KONSYL DAILY FIBER) 28.3 % PACK, Take 1 each by mouth daily., Disp: , Rfl:    rosuvastatin (CRESTOR) 5 MG tablet, Take 5 mg by mouth daily., Disp: , Rfl:    Spacer/Aero-Holding Chambers (AEROCHAMBER MV) inhaler, Use as instructed, Disp: 1 each, Rfl: 0   Allergies  Allergen Reactions   Tussionex Pennkinetic Er [Hydrocod Poli-Chlorphe Poli Er] Other (See Comments)    Lethargy- Had to reverse hydrocodone with Narcan.   Benzodiazepines Other (See Comments)    Family reports high sensitivity  Sulfonamide Derivatives Other (See Comments)    Nervous, agitation   Tricyclic Antidepressants Other (See Comments)    Family reports high sensitivity    Past Medical History:  Diagnosis Date   Diabetes (HCC)    Hearing loss    Thalamic infarction [facility])      Past Surgical History:  Procedure Laterality Date   fractured rib      Family History  Problem Relation Age of Onset   Cancer Mother    Heart disease Sister    Diabetes Other    Stroke Other     Social History   Tobacco Use   Smoking status: Never   Smokeless tobacco: Never  Substance Use Topics   Alcohol use: No   Drug use: No    ROS   Objective:   Vitals: BP 127/70 (BP Location: Right Arm)   Pulse 78   Temp 97.8 F (36.6  C) (Oral)   SpO2 95%   Physical Exam Constitutional:      General: She is not in acute distress.    Appearance: Normal appearance. She is well-developed. She is not ill-appearing, toxic-appearing or diaphoretic.  HENT:     Head: Normocephalic and atraumatic.     Right Ear: Tympanic membrane, ear canal and external ear normal. No tenderness. There is impacted cerumen. Tympanic membrane is not injected, perforated, erythematous or bulging.     Left Ear: Tympanic membrane, ear canal and external ear normal. No tenderness. There is no impacted cerumen. Tympanic membrane is not injected, perforated, erythematous or bulging.     Nose: Nose normal.     Mouth/Throat:     Mouth: Mucous membranes are moist.  Eyes:     General: No scleral icterus.       Right eye: No discharge.        Left eye: No discharge.     Extraocular Movements: Extraocular movements intact.  Cardiovascular:     Rate and Rhythm: Normal rate.  Pulmonary:     Effort: Pulmonary effort is normal.  Skin:    General: Skin is warm and dry.  Neurological:     General: No focal deficit present.     Mental Status: She is alert and oriented to person, place, and time.  Psychiatric:        Mood and Affect: Mood normal.        Behavior: Behavior normal.   Ear lavage performed using mixture of peroxide and water.  Pressure irrigation performed using a bottle and a thin ear tube.  Right ear lavage.  No curette was used.  Assessment and Plan :   PDMP not reviewed this encounter.  1. Impacted cerumen of right ear    Right ear lavage performed.  Will continue with use of Debrox and follow up with ENT. General management of cerumen impaction reviewed with patient.  Anticipatory guidance provided. Counseled patient on potential for adverse effects with medications prescribed/recommended today, ER and return-to-clinic precautions discussed, patient verbalized understanding.    Wallis Bamberg, [place] 01/19/22 1335

## 2022-01-19 NOTE — ED Triage Notes (Signed)
Pt here with cerumen impaction on both ears after being fitted for hearing aids at [facility].

## 2022-12-02 IMAGING — DX DG CHEST 1V PORT
1 series · 1 of 1 positions shown · non-contrast
Comparison: 10/03/2019

CLINICAL DATA: Tachypnea

EXAM:
PORTABLE CHEST 1 VIEW

[chest ap]
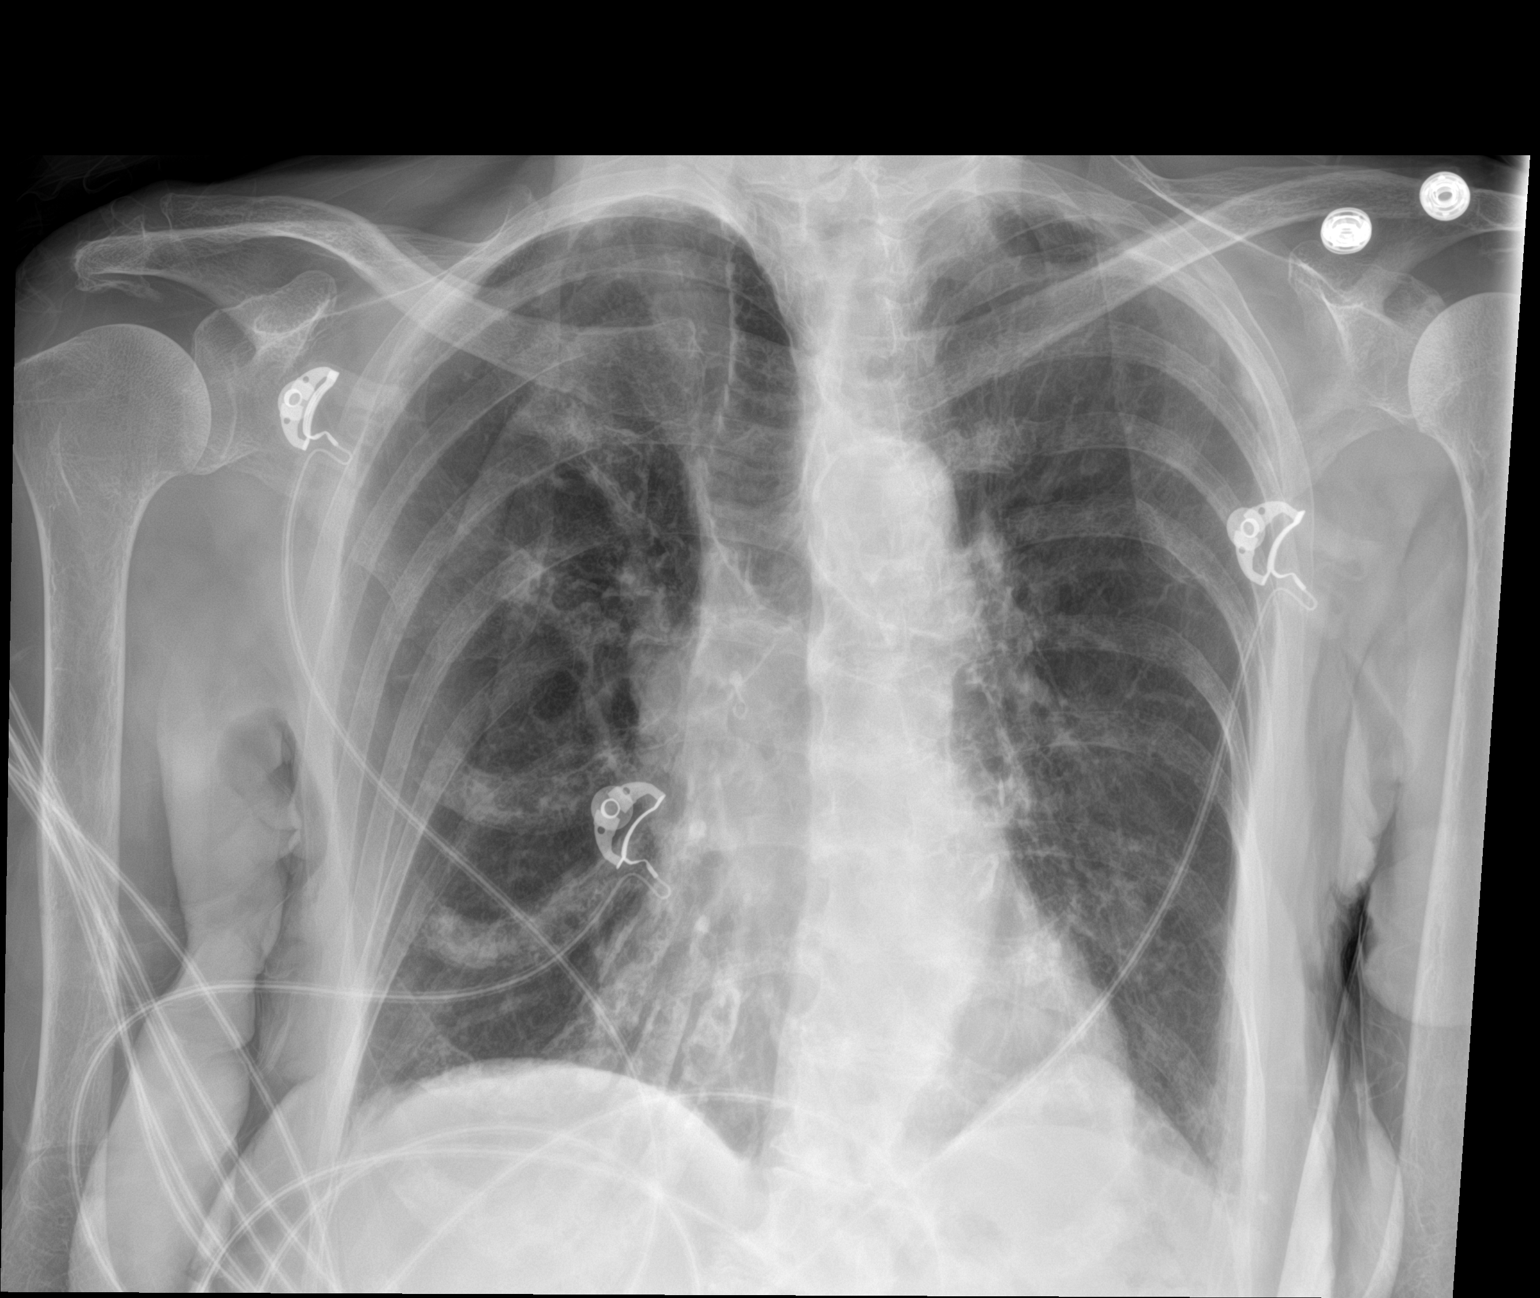

[1 of 1 positions shown; findings below may reference images not displayed]

FINDINGS: Single frontal view of the chest demonstrates an unremarkable
cardiac silhouette. No acute airspace disease, effusion, or
pneumothorax. No acute bony abnormalities. Prominent skin folds
overlie the right chest.
IMPRESSION: 1. No acute intrathoracic process.

## 2022-12-02 IMAGING — CT CT KNEE*R* W/O CM
3 of 4 series · 15 of 33 positions shown, 17 images · non-contrast
Comparison: Right knee x-rays from same day.

CLINICAL DATA: Right knee pain and swelling after twisting injury
yesterday.

EXAM:
CT OF THE RIGHT KNEE WITHOUT CONTRAST
TECHNIQUE: Multidetector CT imaging of the right knee was performed according
to the standard protocol. Multiplanar CT image reconstructions were
also generated.

[Series 4: extremity soft tissue · axial · 0.39mm/px · z∈[-908,-750]mm · 8 of 95 slices shown, 10 images]
[im 8/95  soft-tissue]
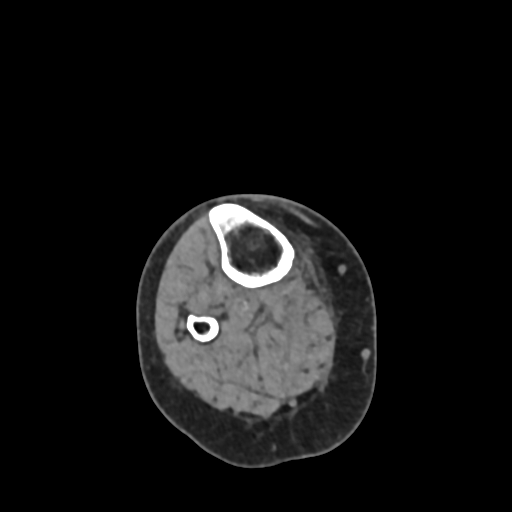
[im 8/95  bone]
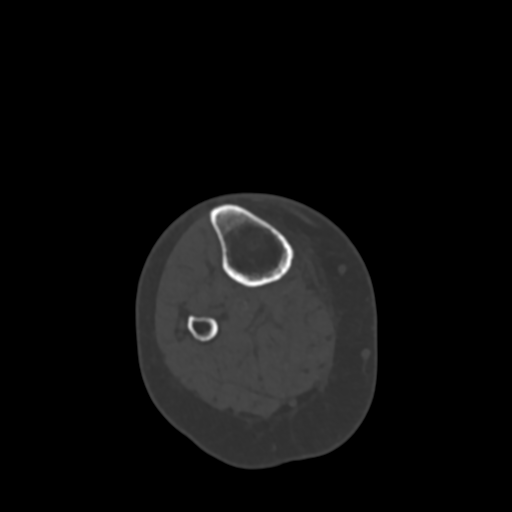
[im 22/95  bone]
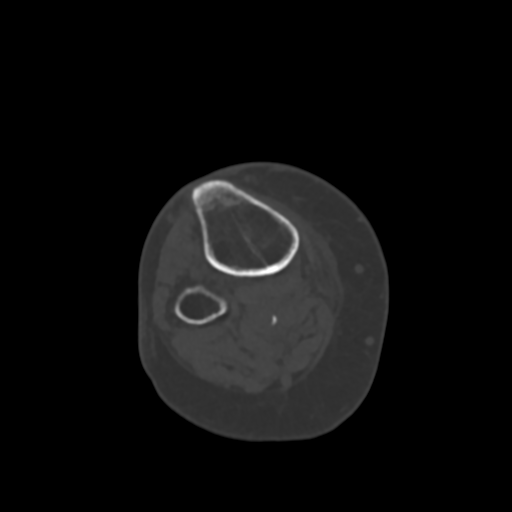
[im 29/95  bone]
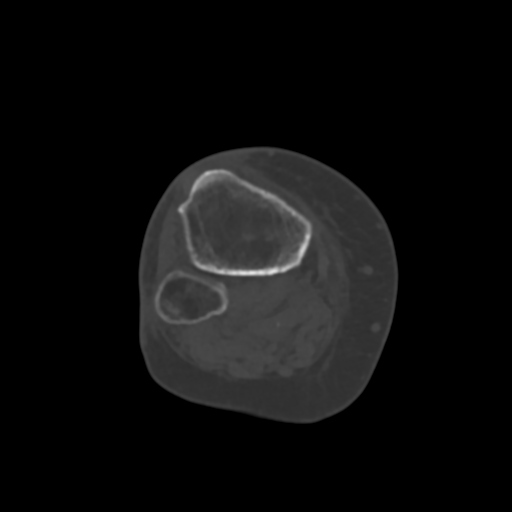
[im 44/95  bone]
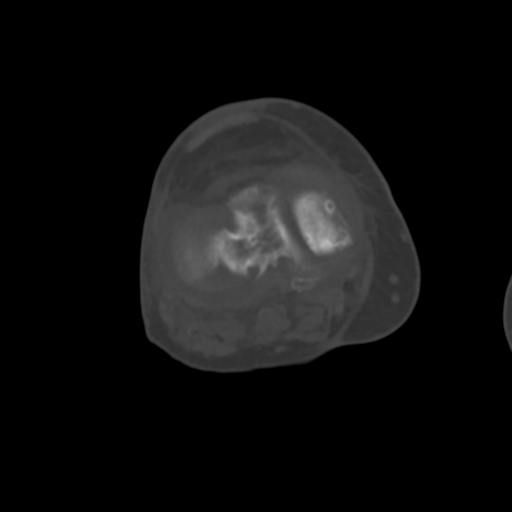
[im 51/95  soft-tissue]
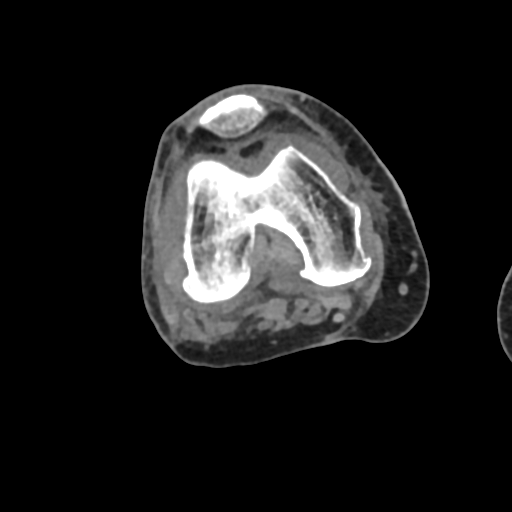
[im 51/95  bone]
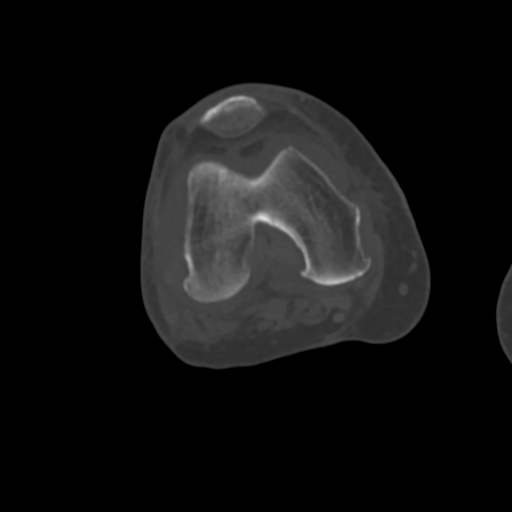
[im 66/95  bone]
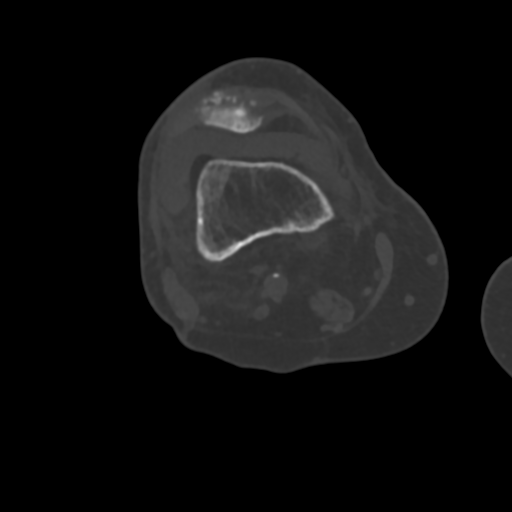
[im 73/95  bone]
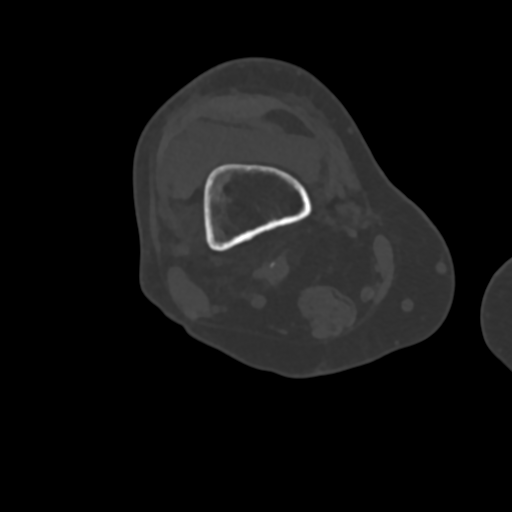
[im 87/95  bone]
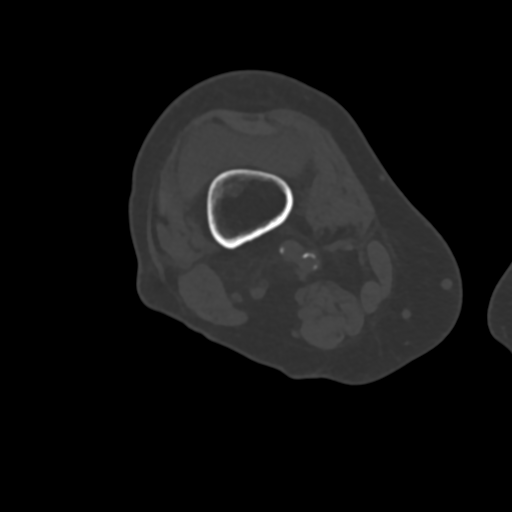

[Series 7: sag bone · sagittal · 0.36mm/px · 4 of 79 slices shown]
[im 16/79  bone]
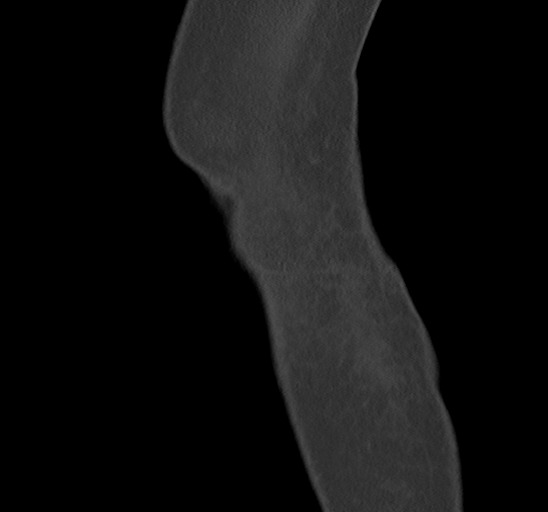
[im 32/79  bone]
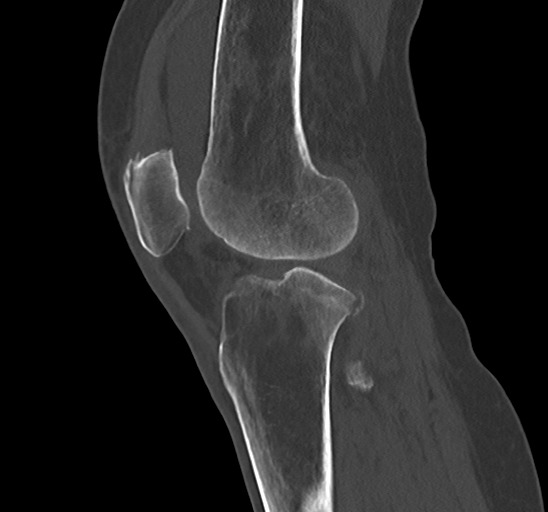
[im 47/79  bone]
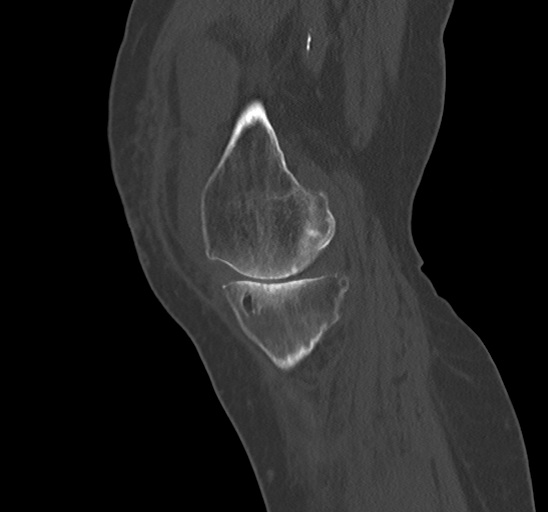
[im 63/79  bone]
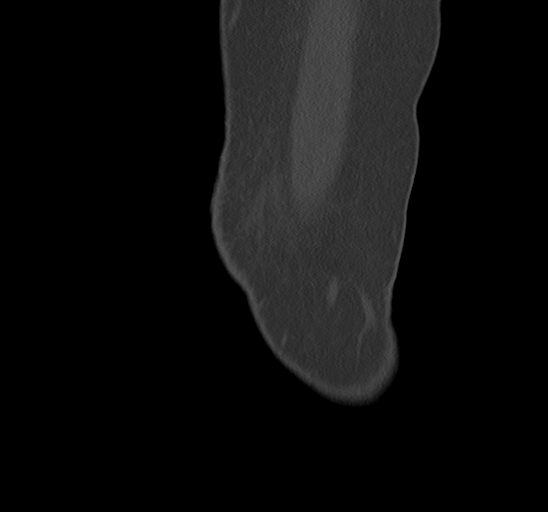

[Series 8: cor soft tissue · coronal · 0.31mm/px · 3 of 77 slices shown]
[im 16/77  bone]
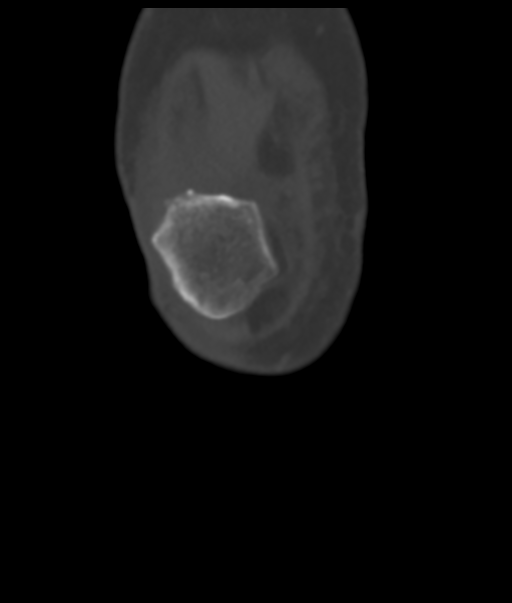
[im 31/77  bone]
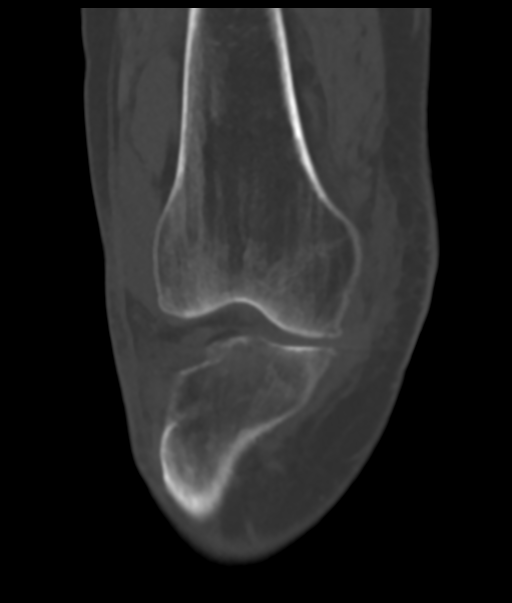
[im 46/77  bone]
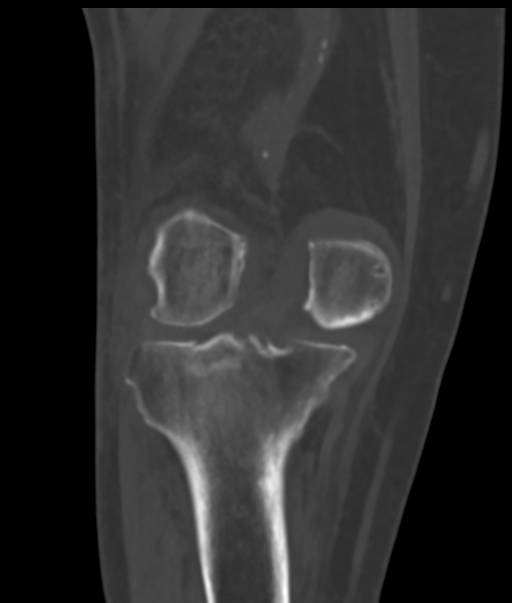

[15 of 33 positions shown; findings below may reference images not displayed]

FINDINGS: Bones/Joint/Cartilage

Acute minimally displaced fracture of the posterior lateral tibial
plateau. Acute minimally depressed fracture of the posterior medial
tibial plateau with associated nondisplaced longitudinal component
involving the posterior metaphyseal cortex (series 6, image 51). No
dislocation.

Moderate to severe medial compartment joint space narrowing with
subchondral sclerosis and cystic change. Small tricompartmental
marginal osteophytes. Osteopenia. Large lipohemarthrosis.

Ligaments

Ligaments are suboptimally evaluated by CT.

Muscles and Tendons
Grossly intact.

Soft tissue
No fluid collection or hematoma.  No soft tissue mass.
IMPRESSION: 1. Acute bicondylar tibial plateau fracture as described above.
2. Large lipohemarthrosis.
3. Moderate to severe medial compartment osteoarthritis.
